# Patient Record
Sex: Female | Born: 1980 | Race: Black or African American | Hispanic: No | Smoking: Current every day smoker
Health system: Southern US, Academic
[De-identification: ages and names within clinical notes are randomized; demographics above are authoritative.]

## PROBLEM LIST (undated history)

## (undated) ENCOUNTER — Inpatient Hospital Stay (HOSPITAL_COMMUNITY): Payer: Self-pay

## (undated) DIAGNOSIS — E669 Obesity, unspecified: Secondary | ICD-10-CM

## (undated) DIAGNOSIS — N390 Urinary tract infection, site not specified: Secondary | ICD-10-CM

## (undated) DIAGNOSIS — O139 Gestational [pregnancy-induced] hypertension without significant proteinuria, unspecified trimester: Secondary | ICD-10-CM

## (undated) DIAGNOSIS — A599 Trichomoniasis, unspecified: Secondary | ICD-10-CM

## (undated) DIAGNOSIS — F329 Major depressive disorder, single episode, unspecified: Secondary | ICD-10-CM

## (undated) DIAGNOSIS — F17200 Nicotine dependence, unspecified, uncomplicated: Secondary | ICD-10-CM

## (undated) DIAGNOSIS — N159 Renal tubulo-interstitial disease, unspecified: Secondary | ICD-10-CM

## (undated) DIAGNOSIS — A539 Syphilis, unspecified: Secondary | ICD-10-CM

## (undated) DIAGNOSIS — Z8619 Personal history of other infectious and parasitic diseases: Secondary | ICD-10-CM

## (undated) DIAGNOSIS — I10 Essential (primary) hypertension: Secondary | ICD-10-CM

## (undated) DIAGNOSIS — O165 Unspecified maternal hypertension, complicating the puerperium: Secondary | ICD-10-CM

## (undated) DIAGNOSIS — IMO0002 Reserved for concepts with insufficient information to code with codable children: Secondary | ICD-10-CM

## (undated) DIAGNOSIS — Z872 Personal history of diseases of the skin and subcutaneous tissue: Secondary | ICD-10-CM

## (undated) DIAGNOSIS — B9689 Other specified bacterial agents as the cause of diseases classified elsewhere: Secondary | ICD-10-CM

## (undated) DIAGNOSIS — B379 Candidiasis, unspecified: Secondary | ICD-10-CM

## (undated) DIAGNOSIS — F32A Depression, unspecified: Secondary | ICD-10-CM

## (undated) DIAGNOSIS — N760 Acute vaginitis: Secondary | ICD-10-CM

## (undated) HISTORY — DX: Trichomoniasis, unspecified: A59.9

## (undated) HISTORY — DX: Personal history of diseases of the skin and subcutaneous tissue: Z87.2

## (undated) HISTORY — DX: Reserved for concepts with insufficient information to code with codable children: IMO0002

## (undated) HISTORY — DX: Depression, unspecified: F32.A

## (undated) HISTORY — DX: Personal history of other infectious and parasitic diseases: Z86.19

## (undated) HISTORY — DX: Obesity, unspecified: E66.9

## (undated) HISTORY — DX: Urinary tract infection, site not specified: N39.0

## (undated) HISTORY — DX: Major depressive disorder, single episode, unspecified: F32.9

## (undated) HISTORY — DX: Nicotine dependence, unspecified, uncomplicated: F17.200

## (undated) HISTORY — DX: Syphilis, unspecified: A53.9

## (undated) HISTORY — DX: Unspecified maternal hypertension, complicating the puerperium: O16.5

## (undated) HISTORY — DX: Gestational (pregnancy-induced) hypertension without significant proteinuria, unspecified trimester: O13.9

## (undated) HISTORY — DX: Renal tubulo-interstitial disease, unspecified: N15.9

## (undated) HISTORY — DX: Acute vaginitis: N76.0

## (undated) HISTORY — DX: Other specified bacterial agents as the cause of diseases classified elsewhere: B96.89

## (undated) HISTORY — DX: Candidiasis, unspecified: B37.9

---

## 2003-12-10 DIAGNOSIS — Z8619 Personal history of other infectious and parasitic diseases: Secondary | ICD-10-CM

## 2003-12-10 HISTORY — DX: Personal history of other infectious and parasitic diseases: Z86.19

## 2004-01-05 ENCOUNTER — Ambulatory Visit (HOSPITAL_COMMUNITY): Admission: RE | Admit: 2004-01-05 | Discharge: 2004-01-05 | Payer: Self-pay | Admitting: *Deleted

## 2004-01-10 DIAGNOSIS — N760 Acute vaginitis: Secondary | ICD-10-CM

## 2004-01-10 DIAGNOSIS — B9689 Other specified bacterial agents as the cause of diseases classified elsewhere: Secondary | ICD-10-CM

## 2004-01-10 DIAGNOSIS — A599 Trichomoniasis, unspecified: Secondary | ICD-10-CM

## 2004-01-10 HISTORY — DX: Other specified bacterial agents as the cause of diseases classified elsewhere: N76.0

## 2004-01-10 HISTORY — DX: Trichomoniasis, unspecified: A59.9

## 2004-01-10 HISTORY — DX: Other specified bacterial agents as the cause of diseases classified elsewhere: B96.89

## 2004-02-15 ENCOUNTER — Inpatient Hospital Stay (HOSPITAL_COMMUNITY): Admission: AD | Admit: 2004-02-15 | Discharge: 2004-02-15 | Payer: Self-pay | Admitting: *Deleted

## 2004-02-25 ENCOUNTER — Emergency Department (HOSPITAL_COMMUNITY): Admission: EM | Admit: 2004-02-25 | Discharge: 2004-02-25 | Payer: Self-pay | Admitting: Emergency Medicine

## 2004-03-14 ENCOUNTER — Inpatient Hospital Stay (HOSPITAL_COMMUNITY): Admission: AD | Admit: 2004-03-14 | Discharge: 2004-03-14 | Payer: Self-pay | Admitting: Obstetrics and Gynecology

## 2004-03-16 ENCOUNTER — Inpatient Hospital Stay (HOSPITAL_COMMUNITY): Admission: AD | Admit: 2004-03-16 | Discharge: 2004-03-16 | Payer: Self-pay | Admitting: Obstetrics & Gynecology

## 2004-03-23 ENCOUNTER — Ambulatory Visit (HOSPITAL_COMMUNITY): Admission: RE | Admit: 2004-03-23 | Discharge: 2004-03-23 | Payer: Self-pay | Admitting: Obstetrics and Gynecology

## 2004-05-06 ENCOUNTER — Inpatient Hospital Stay (HOSPITAL_COMMUNITY): Admission: AD | Admit: 2004-05-06 | Discharge: 2004-05-06 | Payer: Self-pay | Admitting: Obstetrics and Gynecology

## 2004-06-29 ENCOUNTER — Inpatient Hospital Stay (HOSPITAL_COMMUNITY): Admission: AD | Admit: 2004-06-29 | Discharge: 2004-06-30 | Payer: Self-pay | Admitting: Obstetrics and Gynecology

## 2004-08-13 ENCOUNTER — Inpatient Hospital Stay (HOSPITAL_COMMUNITY): Admission: AD | Admit: 2004-08-13 | Discharge: 2004-08-15 | Payer: Self-pay | Admitting: Obstetrics and Gynecology

## 2004-08-13 DIAGNOSIS — N390 Urinary tract infection, site not specified: Secondary | ICD-10-CM

## 2004-08-13 HISTORY — DX: Urinary tract infection, site not specified: N39.0

## 2005-02-07 ENCOUNTER — Emergency Department (HOSPITAL_COMMUNITY): Admission: EM | Admit: 2005-02-07 | Discharge: 2005-02-07 | Payer: Self-pay | Admitting: Emergency Medicine

## 2005-05-02 ENCOUNTER — Emergency Department (HOSPITAL_COMMUNITY): Admission: EM | Admit: 2005-05-02 | Discharge: 2005-05-02 | Payer: Self-pay | Admitting: Emergency Medicine

## 2006-02-27 ENCOUNTER — Other Ambulatory Visit: Admission: RE | Admit: 2006-02-27 | Discharge: 2006-02-27 | Payer: Self-pay | Admitting: Obstetrics and Gynecology

## 2006-06-26 ENCOUNTER — Inpatient Hospital Stay (HOSPITAL_COMMUNITY): Admission: AD | Admit: 2006-06-26 | Discharge: 2006-06-26 | Payer: Self-pay | Admitting: Obstetrics and Gynecology

## 2006-08-13 ENCOUNTER — Inpatient Hospital Stay (HOSPITAL_COMMUNITY): Admission: AD | Admit: 2006-08-13 | Discharge: 2006-08-16 | Payer: Self-pay | Admitting: Obstetrics and Gynecology

## 2006-08-25 ENCOUNTER — Inpatient Hospital Stay (HOSPITAL_COMMUNITY): Admission: AD | Admit: 2006-08-25 | Discharge: 2006-08-27 | Payer: Self-pay | Admitting: Obstetrics and Gynecology

## 2006-09-03 DIAGNOSIS — O165 Unspecified maternal hypertension, complicating the puerperium: Secondary | ICD-10-CM

## 2006-09-03 HISTORY — DX: Unspecified maternal hypertension, complicating the puerperium: O16.5

## 2007-04-23 ENCOUNTER — Encounter (INDEPENDENT_AMBULATORY_CARE_PROVIDER_SITE_OTHER): Payer: Self-pay | Admitting: *Deleted

## 2007-08-15 ENCOUNTER — Emergency Department (HOSPITAL_COMMUNITY): Admission: EM | Admit: 2007-08-15 | Discharge: 2007-08-16 | Payer: Self-pay | Admitting: Emergency Medicine

## 2007-12-09 ENCOUNTER — Emergency Department (HOSPITAL_COMMUNITY): Admission: EM | Admit: 2007-12-09 | Discharge: 2007-12-09 | Payer: Self-pay | Admitting: Emergency Medicine

## 2008-02-01 ENCOUNTER — Emergency Department (HOSPITAL_COMMUNITY): Admission: EM | Admit: 2008-02-01 | Discharge: 2008-02-01 | Payer: Self-pay | Admitting: Family Medicine

## 2008-06-16 ENCOUNTER — Emergency Department (HOSPITAL_COMMUNITY): Admission: EM | Admit: 2008-06-16 | Discharge: 2008-06-16 | Payer: Self-pay | Admitting: Emergency Medicine

## 2008-07-28 ENCOUNTER — Emergency Department (HOSPITAL_COMMUNITY): Admission: EM | Admit: 2008-07-28 | Discharge: 2008-07-29 | Payer: Self-pay | Admitting: Emergency Medicine

## 2008-12-05 ENCOUNTER — Emergency Department (HOSPITAL_COMMUNITY): Admission: EM | Admit: 2008-12-05 | Discharge: 2008-12-06 | Payer: Self-pay | Admitting: Emergency Medicine

## 2009-01-06 ENCOUNTER — Encounter (INDEPENDENT_AMBULATORY_CARE_PROVIDER_SITE_OTHER): Payer: Self-pay | Admitting: Family Medicine

## 2009-01-06 ENCOUNTER — Ambulatory Visit: Payer: Self-pay | Admitting: Family Medicine

## 2009-01-06 DIAGNOSIS — E669 Obesity, unspecified: Secondary | ICD-10-CM | POA: Insufficient documentation

## 2009-01-06 DIAGNOSIS — I10 Essential (primary) hypertension: Secondary | ICD-10-CM | POA: Insufficient documentation

## 2009-01-06 DIAGNOSIS — E66812 Obesity, class 2: Secondary | ICD-10-CM | POA: Insufficient documentation

## 2009-01-06 DIAGNOSIS — N943 Premenstrual tension syndrome: Secondary | ICD-10-CM | POA: Insufficient documentation

## 2009-01-06 DIAGNOSIS — F17200 Nicotine dependence, unspecified, uncomplicated: Secondary | ICD-10-CM | POA: Insufficient documentation

## 2009-01-06 LAB — CONVERTED CEMR LAB
AST: 26 units/L (ref 0–37)
BUN: 6 mg/dL (ref 6–23)
CO2: 24 meq/L (ref 19–32)
Calcium: 9.3 mg/dL (ref 8.4–10.5)
Chloride: 108 meq/L (ref 96–112)
Cholesterol: 127 mg/dL (ref 0–200)
Creatinine, Ser: 0.81 mg/dL (ref 0.40–1.20)
HDL: 44 mg/dL (ref >39)
Total Bilirubin: 0.3 mg/dL (ref 0.3–1.2)
Total CHOL/HDL Ratio: 2.9
VLDL: 11 mg/dL (ref 0–40)

## 2009-01-09 ENCOUNTER — Encounter (INDEPENDENT_AMBULATORY_CARE_PROVIDER_SITE_OTHER): Payer: Self-pay | Admitting: Family Medicine

## 2009-02-14 ENCOUNTER — Telehealth: Payer: Self-pay | Admitting: *Deleted

## 2009-03-20 ENCOUNTER — Telehealth (INDEPENDENT_AMBULATORY_CARE_PROVIDER_SITE_OTHER): Payer: Self-pay | Admitting: Family Medicine

## 2009-03-22 ENCOUNTER — Encounter (INDEPENDENT_AMBULATORY_CARE_PROVIDER_SITE_OTHER): Payer: Self-pay | Admitting: Family Medicine

## 2009-03-22 ENCOUNTER — Ambulatory Visit: Payer: Self-pay | Admitting: Family Medicine

## 2009-03-22 DIAGNOSIS — IMO0002 Reserved for concepts with insufficient information to code with codable children: Secondary | ICD-10-CM | POA: Insufficient documentation

## 2009-04-21 ENCOUNTER — Emergency Department (HOSPITAL_COMMUNITY): Admission: EM | Admit: 2009-04-21 | Discharge: 2009-04-21 | Payer: Self-pay | Admitting: Emergency Medicine

## 2009-08-22 ENCOUNTER — Telehealth: Payer: Self-pay | Admitting: Family Medicine

## 2009-08-29 ENCOUNTER — Emergency Department (HOSPITAL_COMMUNITY): Admission: EM | Admit: 2009-08-29 | Discharge: 2009-08-30 | Payer: Self-pay | Admitting: Emergency Medicine

## 2010-02-05 ENCOUNTER — Emergency Department (HOSPITAL_BASED_OUTPATIENT_CLINIC_OR_DEPARTMENT_OTHER): Admission: EM | Admit: 2010-02-05 | Discharge: 2010-02-05 | Payer: Self-pay | Admitting: Emergency Medicine

## 2010-02-05 ENCOUNTER — Ambulatory Visit: Payer: Self-pay | Admitting: Interventional Radiology

## 2010-06-04 ENCOUNTER — Emergency Department (HOSPITAL_COMMUNITY): Admission: EM | Admit: 2010-06-04 | Discharge: 2010-06-05 | Payer: Self-pay | Admitting: Emergency Medicine

## 2010-06-13 ENCOUNTER — Emergency Department (HOSPITAL_BASED_OUTPATIENT_CLINIC_OR_DEPARTMENT_OTHER): Admission: EM | Admit: 2010-06-13 | Discharge: 2010-06-13 | Payer: Self-pay | Admitting: Emergency Medicine

## 2011-01-18 ENCOUNTER — Encounter: Payer: Self-pay | Admitting: *Deleted

## 2011-02-06 ENCOUNTER — Emergency Department (HOSPITAL_BASED_OUTPATIENT_CLINIC_OR_DEPARTMENT_OTHER)
Admission: EM | Admit: 2011-02-06 | Discharge: 2011-02-06 | Disposition: A | Discharge status: Home / Self Care | Payer: Medicaid Other | Attending: Emergency Medicine | Admitting: Emergency Medicine

## 2011-02-06 DIAGNOSIS — I10 Essential (primary) hypertension: Secondary | ICD-10-CM | POA: Insufficient documentation

## 2011-02-06 DIAGNOSIS — B9689 Other specified bacterial agents as the cause of diseases classified elsewhere: Secondary | ICD-10-CM | POA: Insufficient documentation

## 2011-02-06 DIAGNOSIS — E669 Obesity, unspecified: Secondary | ICD-10-CM | POA: Insufficient documentation

## 2011-02-06 DIAGNOSIS — A499 Bacterial infection, unspecified: Secondary | ICD-10-CM | POA: Insufficient documentation

## 2011-02-06 DIAGNOSIS — N760 Acute vaginitis: Secondary | ICD-10-CM | POA: Insufficient documentation

## 2011-02-06 LAB — PREGNANCY, URINE: Preg Test, Ur: NEGATIVE

## 2011-02-06 LAB — WET PREP, GENITAL
Trich, Wet Prep: NONE SEEN
Yeast Wet Prep HPF POC: NONE SEEN

## 2011-02-07 LAB — RPR: RPR Ser Ql: NONREACTIVE

## 2011-02-07 LAB — GC/CHLAMYDIA PROBE AMP, GENITAL
Chlamydia, DNA Probe: NEGATIVE
GC Probe Amp, Genital: POSITIVE — AB

## 2011-02-16 ENCOUNTER — Emergency Department (HOSPITAL_BASED_OUTPATIENT_CLINIC_OR_DEPARTMENT_OTHER): Payer: Self-pay

## 2011-02-16 ENCOUNTER — Emergency Department (HOSPITAL_BASED_OUTPATIENT_CLINIC_OR_DEPARTMENT_OTHER)
Admission: EM | Admit: 2011-02-16 | Discharge: 2011-02-16 | Disposition: A | Discharge status: Home / Self Care | Payer: Self-pay | Attending: Emergency Medicine | Admitting: Emergency Medicine

## 2011-02-16 ENCOUNTER — Emergency Department (INDEPENDENT_AMBULATORY_CARE_PROVIDER_SITE_OTHER): Payer: Self-pay

## 2011-02-16 DIAGNOSIS — IMO0002 Reserved for concepts with insufficient information to code with codable children: Secondary | ICD-10-CM

## 2011-02-16 DIAGNOSIS — S60229A Contusion of unspecified hand, initial encounter: Secondary | ICD-10-CM | POA: Insufficient documentation

## 2011-02-16 DIAGNOSIS — F411 Generalized anxiety disorder: Secondary | ICD-10-CM | POA: Insufficient documentation

## 2011-02-16 DIAGNOSIS — Y9229 Other specified public building as the place of occurrence of the external cause: Secondary | ICD-10-CM | POA: Insufficient documentation

## 2011-02-16 DIAGNOSIS — I10 Essential (primary) hypertension: Secondary | ICD-10-CM | POA: Insufficient documentation

## 2011-02-16 DIAGNOSIS — F17200 Nicotine dependence, unspecified, uncomplicated: Secondary | ICD-10-CM | POA: Insufficient documentation

## 2011-02-24 LAB — CBC
HCT: 40.4 % (ref 36.0–46.0)
MCV: 90.8 fL (ref 78.0–100.0)
Platelets: 285 10*3/uL (ref 150–400)
RBC: 4.45 MIL/uL (ref 3.87–5.11)
WBC: 10.4 10*3/uL (ref 4.0–10.5)

## 2011-02-24 LAB — URINALYSIS, ROUTINE W REFLEX MICROSCOPIC
Bilirubin Urine: NEGATIVE
Leukocytes, UA: NEGATIVE
Nitrite: NEGATIVE
Specific Gravity, Urine: 1.023 (ref 1.005–1.030)
pH: 6 (ref 5.0–8.0)

## 2011-02-24 LAB — DIFFERENTIAL
Eosinophils Relative: 4 % (ref 0–5)
Lymphocytes Relative: 27 % (ref 12–46)
Lymphs Abs: 2.8 10*3/uL (ref 0.7–4.0)

## 2011-02-24 LAB — POCT I-STAT, CHEM 8
Calcium, Ion: 1.2 mmol/L (ref 1.12–1.32)
Creatinine, Ser: 1.2 mg/dL (ref 0.4–1.2)
Glucose, Bld: 93 mg/dL (ref 70–99)
Hemoglobin: 14.6 g/dL (ref 12.0–15.0)
Sodium: 140 mEq/L (ref 135–145)
TCO2: 25 mmol/L (ref 0–100)

## 2011-02-24 LAB — POCT PREGNANCY, URINE: Preg Test, Ur: NEGATIVE

## 2011-02-24 LAB — URINE MICROSCOPIC-ADD ON

## 2011-02-24 LAB — WET PREP, GENITAL

## 2011-02-28 LAB — BASIC METABOLIC PANEL
GFR calc Af Amer: >60 mL/min (ref >60)
GFR calc non Af Amer: >60 mL/min (ref >60)
Potassium: 3.4 mEq/L — ABNORMAL LOW (ref 3.5–5.1)
Sodium: 146 mEq/L — ABNORMAL HIGH (ref 135–145)

## 2011-02-28 LAB — DIFFERENTIAL
Eosinophils Relative: 4 % (ref 0–5)
Lymphocytes Relative: 34 % (ref 12–46)
Lymphs Abs: 3.1 10*3/uL (ref 0.7–4.0)
Monocytes Absolute: 0.7 10*3/uL (ref 0.1–1.0)
Neutro Abs: 4.9 10*3/uL (ref 1.7–7.7)

## 2011-02-28 LAB — POCT CARDIAC MARKERS
CKMB, poc: <1 ng/mL — ABNORMAL LOW (ref 1.0–8.0)
Troponin i, poc: <0.05 ng/mL (ref 0.00–0.09)

## 2011-02-28 LAB — CBC
HCT: 37.9 % (ref 36.0–46.0)
Hemoglobin: 12.6 g/dL (ref 12.0–15.0)
WBC: 9.3 10*3/uL (ref 4.0–10.5)

## 2011-04-22 ENCOUNTER — Emergency Department (HOSPITAL_BASED_OUTPATIENT_CLINIC_OR_DEPARTMENT_OTHER)
Admission: EM | Admit: 2011-04-22 | Discharge: 2011-04-23 | Disposition: A | Discharge status: Home / Self Care | Payer: Self-pay | Attending: Emergency Medicine | Admitting: Emergency Medicine

## 2011-04-22 DIAGNOSIS — F17200 Nicotine dependence, unspecified, uncomplicated: Secondary | ICD-10-CM | POA: Insufficient documentation

## 2011-04-22 DIAGNOSIS — R05 Cough: Secondary | ICD-10-CM | POA: Insufficient documentation

## 2011-04-22 DIAGNOSIS — R112 Nausea with vomiting, unspecified: Secondary | ICD-10-CM | POA: Insufficient documentation

## 2011-04-22 DIAGNOSIS — R071 Chest pain on breathing: Secondary | ICD-10-CM | POA: Insufficient documentation

## 2011-04-22 DIAGNOSIS — E669 Obesity, unspecified: Secondary | ICD-10-CM | POA: Insufficient documentation

## 2011-04-22 DIAGNOSIS — B9789 Other viral agents as the cause of diseases classified elsewhere: Secondary | ICD-10-CM | POA: Insufficient documentation

## 2011-04-22 DIAGNOSIS — R059 Cough, unspecified: Secondary | ICD-10-CM | POA: Insufficient documentation

## 2011-04-22 DIAGNOSIS — R197 Diarrhea, unspecified: Secondary | ICD-10-CM | POA: Insufficient documentation

## 2011-04-22 LAB — URINALYSIS, ROUTINE W REFLEX MICROSCOPIC
Bilirubin Urine: NEGATIVE
Nitrite: NEGATIVE
Protein, ur: NEGATIVE mg/dL
Specific Gravity, Urine: 1.022 (ref 1.005–1.030)
Urobilinogen, UA: 1 mg/dL (ref 0.0–1.0)

## 2011-04-22 LAB — URINE MICROSCOPIC-ADD ON

## 2011-04-22 LAB — PREGNANCY, URINE: Preg Test, Ur: NEGATIVE

## 2011-04-23 ENCOUNTER — Emergency Department (INDEPENDENT_AMBULATORY_CARE_PROVIDER_SITE_OTHER): Payer: Self-pay

## 2011-04-23 DIAGNOSIS — R509 Fever, unspecified: Secondary | ICD-10-CM

## 2011-04-23 DIAGNOSIS — R059 Cough, unspecified: Secondary | ICD-10-CM

## 2011-04-23 DIAGNOSIS — R05 Cough: Secondary | ICD-10-CM
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
## 2011-04-26 NOTE — Discharge Summary (Signed)
NAME:  Crosby, Lisa                   ACCOUNT NO.:  [id number]   MEDICAL RECORD NO.:  [id number]          PATIENT TYPE:  INP   LOCATION:  9372                          [facility]  [facility]   PHYSICIAN:  Dois Davenport A. Rivard, M.D. DATE OF BIRTH:  May 28, 1981   DATE OF ADMISSION:  08/25/2006  DATE OF DISCHARGE:  08/27/2006                                 DISCHARGE SUMMARY   ADMITTING DIAGNOSES:  1. Eight days status post spontaneous vaginal birth.  2. Preeclampsia postpartum.  3. Postpartum depression.   DISCHARGE DIAGNOSES:  1. Eight days status post spontaneous vaginal birth.  2. Preeclampsia postpartum.  3. Postpartum depression.   PROCEDURES:  Magnesium sulfate therapy.   HOSPITAL COURSE:  [name] is a 30 year old gravida 2, para 2-0-0-2 at  eight days' status post spontaneous vaginal birth who presented from the  office with elevated blood pressure and proteinuria on a voided specimen.  She also noted headaches, visual symptoms, anorexia, and postpartum  depression.  She had been started on Lexapro that day by Rhona Leavens, CNM.  She had no food intake for 2 days and no fluids for 24 hours.  She did  report that she just had no appetite and was having difficulty dealing with  family life and a new baby.   Her history had been remarkable for:   Problem #1:  IUGR for which she was induced on 09/05 to 09/06 with a  spontaneous vaginal birth.  She had some occasional mild elevations of  pressure during that hospitalization, but PIH workup was negative with a  negative urine protein.   Problem #2:  History of depression in the past with postpartum depression  following the birth of her last baby.  The patient has not been on any  medication during her pregnancy.   Problem #3:  History of STDs in the past.  On admission the patient was  noted to have blood pressures in the 140-150/92-105 range.  Her weight was  204 on maternity admission scale.  Her catheterized UA showed 100 mg  protein.  Her CBC and comprehensive metabolic panel were within normal  limits.  She was admitted to the AICU for magnesium sulfate therapy and was  placed on Lexapro 10 mg daily.  The chaplain did see the patient as an  initial visit.  The patient also continued to have a severe headache.  She  was placed on Percocet and Flexeril for her headache.  A social work and  psyche consult were obtained.  Dr. Jeanie Sewer, the psychiatrist, recommended  Trazodone.  Over the next 24 hours her blood pressure began to improve.  Magnesium sulfate was discontinued on 09/18.  Lexapro and Trazodone were  continued.  By hospital day #3 the patient was feeling much better.  Her  headache was very very mild.  She was feeling somewhat anxious to go home,  but also had some reluctance due to just anxiety and understanding that she  had other responsibilities at home. Her mother had come from [place] and  was going to plan to stay with  her for 2 weeks, and the patient felt good  about this plan.  The patient's blood pressure was in the 130-140/78-87  range.  Her physical exam was within normal limits.  She had lost 3 pounds  from the day before. She was diuresing very well with a 2700 mL negative  balance.  Dr. Estanislado Pandy saw the patient that day.  Tera Helper as [facility] also saw the patient.  WIC also was consulted and processes  were made to get the patient certified for [facility].  The patient's psychosocial  situation was improving.  The patient had reported feeling overwhelmed.  The  infant had not been feeding very well due to a breast feeding issue and the  patient was taking on lots of responsibility for that.  The patient denied  any suicidal or homicidal ideations and feels that she was improving  significantly.  The patient declined outpatient counseling.  She does have a  postpartum plan for followup with her [facility].  She is now bottle feeding and  feels more comfortable with that.   Dr.  Estanislado Pandy saw the patient on 09/19 in the afternoon.  The patient was  continuing to feel much better and did want to go home. The patient did ask  Dr. Estanislado Pandy to examine her son's penis.  There was a small pustule at the  base.  There was no redness or edema noted.  The penis was cleaned,  antibiotic ointment was applied and the patient will followup with her  pediatrician.   The plan was made for the patient to go home with additional care for the  patient from her mother.  The patient was deemed to receive full benefit of  her hospital stay and was discharged home.   DISCHARGE INSTRUCTIONS:  The patient is to have the routine postpartum  instructions. She is to monitor for any worsening signs of elevated blood  pressure including headache, visual symptoms, and epigastric pain.  Signs  and symptoms of worsening postpartum depression were also reviewed with the  patient.   POSTPARTUM MEDICATIONS:  1. Aldomet 250 mg p.o. b.i.d.  2. Lexapro 10 mg 1 mg p.o. daily.  3. Trazodone 25 mg 1 p.o. q.h.s.  4. The patient will also continue Motrin 600 mg p.o. q.6 h. p.r.n. pain      and Percocet 1-2 p.o. q.3-4 h. p.r.n. pain that were previously      prescribed.   DISCHARGE FOLLOWUP:  Will occur in 1 week at [facility].  The  patient clearly understands the need to call with any worsening of any  symptoms.      Renaldo Reel Emilee Hero, C.N.M.      Crist Fat Rivard, M.D.  Electronically Signed    VLL/MEDQ  D:  08/28/2006  T:  08/29/2006  Job:  [id number]
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
## 2011-06-22 ENCOUNTER — Encounter: Payer: Self-pay | Admitting: *Deleted

## 2011-06-22 ENCOUNTER — Emergency Department (HOSPITAL_BASED_OUTPATIENT_CLINIC_OR_DEPARTMENT_OTHER)
Admission: EM | Admit: 2011-06-22 | Discharge: 2011-06-22 | Disposition: A | Discharge status: Home / Self Care | Payer: Self-pay | Attending: Emergency Medicine | Admitting: Emergency Medicine

## 2011-06-22 DIAGNOSIS — Z202 Contact with and (suspected) exposure to infections with a predominantly sexual mode of transmission: Secondary | ICD-10-CM | POA: Insufficient documentation

## 2011-06-22 DIAGNOSIS — I10 Essential (primary) hypertension: Secondary | ICD-10-CM | POA: Insufficient documentation

## 2011-06-22 HISTORY — DX: Essential (primary) hypertension: I10

## 2011-06-22 MED ORDER — CLONIDINE HCL 0.1 MG PO TABS: 0.1000 mg | ORAL_TABLET | Freq: Once | ORAL | Status: DC

## 2011-06-22 MED ORDER — CLONIDINE HCL 0.1 MG PO TABS
0.1000 mg | ORAL_TABLET | Freq: Once | ORAL | Status: AC
  Administered 2011-06-22: 0.1 mg via ORAL
  Filled 2011-06-22: qty 1

## 2011-06-22 MED ORDER — LIDOCAINE HCL (PF) 1 % IJ SOLN
INTRAMUSCULAR | Status: AC
  Administered 2011-06-22: 18:00:00 via INTRAMUSCULAR
  Filled 2011-06-22: qty 5

## 2011-06-22 MED ORDER — CEFTRIAXONE SODIUM 250 MG IJ SOLR
250.0000 mg | INTRAMUSCULAR | Status: DC
  Administered 2011-06-22: 250 mg via INTRAMUSCULAR
  Filled 2011-06-22: qty 250

## 2011-06-22 NOTE — ED Provider Notes (Signed)
History     Chief Complaint  Patient presents with  . Hypertension   Patient is a 30 y.o. female presenting with hypertension. The history is provided by the patient.  Hypertension This is a chronic problem. The problem has been unchanged. Pertinent negatives include no chest pain, chills or fever. Associated symptoms comments: She reports that she has no chest pain, shortness of breath or extremity swelling.. The symptoms are aggravated by nothing. She has tried nothing for the symptoms.    Past Medical History  Diagnosis Date  . Hypertension     History reviewed. No pertinent past surgical history.  No family history on file.  History  Substance Use Topics  . Smoking status: Current Some Day Smoker  . Smokeless tobacco: Not on file  . Alcohol Use: Yes    OB History    Grav Para Term Preterm Abortions TAB SAB Ect Mult Living                  Review of Systems  Constitutional: Negative for fever and chills.  HENT: Negative.   Respiratory: Negative.   Cardiovascular: Negative.  Negative for chest pain.  Gastrointestinal: Negative.   Musculoskeletal: Negative.   Skin: Negative.   Neurological: Negative.     Physical Exam  BP 144/97  Pulse 76  Temp(Src) 99.5 F (37.5 C) (Oral)  Resp 16  SpO2 99%  LMP 05/31/2011  Physical Exam  Constitutional: She appears well-developed and well-nourished.  HENT:  Head: Normocephalic.  Neck: Normal range of motion. Neck supple.  Cardiovascular: Normal rate and regular rhythm.   Pulmonary/Chest: Effort normal and breath sounds normal.  Abdominal: Soft. Bowel sounds are normal. There is no tenderness. There is no rebound and no guarding.  Genitourinary:       Deferred:  The patient denies vaginal pain, discharge, abnormal bleeding or lesions.  Musculoskeletal: Normal range of motion. She exhibits no edema.  Neurological: She is alert. No cranial nerve deficit.  Skin: Skin is warm and dry. No rash noted.  Psychiatric: She  has a normal mood and affect.    ED Course  Procedures The patient reports she was seen at the health department recently for vaginal discharge. She was diagnosed with vaginitis and has completed the antibiotic she was given. She reports no further symptoms. She states that she was contacted with a positive result on her cultures for gonorrhea and requests treatment. Will treat based on history. She does not require another pelvic exam. MDM       Rodena Medin, PA 06/22/11 1738  Rodena Medin, PA 06/22/11 1747

## 2011-06-22 NOTE — ED Notes (Signed)
3 days ago ran out of klonidine for HTN, has no insurance and cannot afford to see PCP

## 2011-06-22 NOTE — ED Notes (Signed)
Pt c/o HTN, ran out of meds 3 days ago and cannot afford to go to PCP

## 2011-08-05 NOTE — ED Provider Notes (Signed)
History     [name]: [id number] Arrival date & time: 06/22/2011  5:07 PM  Chief Complaint  Patient presents with  . Hypertension   HPI  Past Medical History  Diagnosis Date  . Hypertension     History reviewed. No pertinent past surgical history.  No family history on file.  History  Substance Use Topics  . Smoking status: Current Some Day Smoker  . Smokeless tobacco: Not on file  . Alcohol Use: Yes    OB History    Grav Para Term Preterm Abortions TAB SAB Ect Mult Living                  Review of Systems  Physical Exam  BP 144/97  Pulse 76  Temp(Src) 99.5 F (37.5 C) (Oral)  Resp 16  SpO2 99%  LMP 05/31/2011  Physical Exam  ED Course  Procedures  MDM Duplicate note      Doug Sou, MD 08/21/11 1050

## 2011-08-18 NOTE — ED Provider Notes (Signed)
Medical screening examination/treatment/procedure(s) were performed by non-physician practitioner and as supervising physician I was immediately available for consultation/collaboration.  Doug Sou, MD 08/18/11 1550

## 2011-09-05 LAB — POCT I-STAT, CHEM 8
Creatinine, Ser: 1
HCT: 43
Hemoglobin: 14.6
Potassium: 3.7
Sodium: 138
TCO2: 25

## 2011-09-05 LAB — POCT CARDIAC MARKERS
CKMB, poc: <1 — ABNORMAL LOW
Myoglobin, poc: 60.9
Operator id: 146091
Troponin i, poc: <0.05

## 2011-09-28 ENCOUNTER — Encounter (HOSPITAL_BASED_OUTPATIENT_CLINIC_OR_DEPARTMENT_OTHER): Payer: Self-pay | Admitting: *Deleted

## 2011-09-28 ENCOUNTER — Emergency Department (HOSPITAL_BASED_OUTPATIENT_CLINIC_OR_DEPARTMENT_OTHER)
Admission: EM | Admit: 2011-09-28 | Discharge: 2011-09-28 | Disposition: A | Discharge status: Home / Self Care | Payer: Self-pay | Attending: Emergency Medicine | Admitting: Emergency Medicine

## 2011-09-28 DIAGNOSIS — K089 Disorder of teeth and supporting structures, unspecified: Secondary | ICD-10-CM | POA: Insufficient documentation

## 2011-09-28 DIAGNOSIS — K0889 Other specified disorders of teeth and supporting structures: Secondary | ICD-10-CM

## 2011-09-28 DIAGNOSIS — I10 Essential (primary) hypertension: Secondary | ICD-10-CM | POA: Insufficient documentation

## 2011-09-28 MED ORDER — PENICILLIN V POTASSIUM 500 MG PO TABS: 500.0000 mg | ORAL_TABLET | Freq: Three times a day (TID) | ORAL | Status: AC

## 2011-09-28 MED ORDER — IBUPROFEN 600 MG PO TABS: 600.0000 mg | ORAL_TABLET | Freq: Four times a day (QID) | ORAL | Status: AC | PRN

## 2011-09-28 MED ORDER — OXYCODONE-ACETAMINOPHEN 5-325 MG PO TABS: 1.0000 | ORAL_TABLET | Freq: Four times a day (QID) | ORAL | Status: AC | PRN

## 2011-09-28 NOTE — ED Notes (Signed)
Dr. Karma Ganja advised of BP. She has discussed this with the pt who is on meds for same.

## 2011-09-28 NOTE — ED Notes (Signed)
MD at bedside. 

## 2011-09-28 NOTE — ED Provider Notes (Signed)
Scribed for Ethelda Chick, MD, the patient was seen in room MH12/MH12 . This chart was scribed by Ellie Lunch. This patient's care was started at 4:53 PM.   [name]: [id number] Arrival date & time: 09/28/2011  4:33 PM   First MD Initiated Contact with Patient 09/28/11 1645      Chief Complaint  Patient presents with  . Dental Pain    (Consider location/radiation/quality/duration/timing/severity/associated sxs/prior treatment) HPI Yarithza F Wojnar is a 30 y.o. female who presents to the Emergency Department complaining of right lower dental pain with some associated swelling. Pain has been present for a while, but became progressively worse two days ago. Pt describes pain as throbbing, severe, and constant. Pain is aggravated by cold air.  Pt treated pain with IB profen this morning with little improvement. Pt denies fever, difficulty swallowing, SOB. Pt reports she has no dentist. Pt reports no recent antibiotic.  No difficulty breathing  Past Medical History  Diagnosis Date  . Hypertension     History reviewed. No pertinent past surgical history.  History reviewed. No pertinent family history.  History  Substance Use Topics  . Smoking status: Current Some Day Smoker  . Smokeless tobacco: Not on file  . Alcohol Use: Yes    Review of Systems 10 Systems reviewed and are negative for acute change except as noted in the HPI.  Allergies  Review of patient's allergies indicates no known allergies.  Home Medications   Current Outpatient Rx  Name Route Sig Dispense Refill  . BUPROPION HCL ER (XL) 300 MG PO TB24 Oral Take 300 mg by mouth every morning.      Marland Kitchen CLONIDINE HCL 0.1 MG PO TABS Oral Take 0.1 mg by mouth 2 (two) times daily.     Marland Kitchen CLONIDINE HCL 0.1 MG PO TABS Oral Take 1 tablet (0.1 mg total) by mouth once. 60 tablet 0    BP 172/99  Pulse 86  Temp(Src) 98.3 F (36.8 C) (Oral)  Resp 20  Ht 5\' 4"  (1.626 m)  Wt 185 lb (83.915 kg)  BMI 31.76 kg/m2  SpO2 96%  LMP  09/05/2011 Vitals reviewed, hypertensive Physical Exam  Constitutional: She is oriented to person, place, and time. She appears well-developed and well-nourished.  HENT:  Head: Normocephalic.       Poor dentition. Right lower premolar 60% eroded. No sign of periapical abscess. No significant adenopathy. No swelling under tongue.   Eyes: Conjunctivae and EOM are normal.  Neck: Neck supple.  Cardiovascular: Normal rate, regular rhythm and normal heart sounds.   Pulmonary/Chest: Effort normal and breath sounds normal.  Abdominal: Soft. There is no tenderness.  Musculoskeletal: Normal range of motion.  Lymphadenopathy:    She has no cervical adenopathy.  Neurological: She is alert and oriented to person, place, and time.  Skin: Skin is warm and dry.  Psychiatric: She has a normal mood and affect.   Procedures (including critical care time)  OTHER DATA REVIEWED: Nursing notes, vital signs, and past medical records reviewed.   DIAGNOSTIC STUDIES: Oxygen Saturation is 96% on room air, normal by my interpretation.    ED COURSE / COORDINATION OF CARE: 5:12 PM Plan to treat pain and infection with abx. Pt can take meds, abx, and ib profen. With follow up with dentist.  MDM: pt with significant decay/erosion of right lower premolar.  No periapical abscess.  No swelling under tongue.  Pt nontoxic and well hydrated appearing.  Rx given for percocet, ibuprofen, pcn.  Stressed the importance  of seeing a dentist in follow up.  Given hypertension precautions and advised to have BP rechecked by PMD   MEDICATIONS GIVEN IN THE E.D.  Medications  oxyCODONE-acetaminophen (PERCOCET) 5-325 MG per tablet   ibuprofen (ADVIL,MOTRIN) 600 MG tablet   penicillin v potassium (VEETID) 500 MG tablet    DISCHARGE MEDICATIONS: New Prescriptions   IBUPROFEN (ADVIL,MOTRIN) 600 MG TABLET    Take 1 tablet (600 mg total) by mouth every 6 (six) hours as needed for pain.   OXYCODONE-ACETAMINOPHEN (PERCOCET) 5-325  MG PER TABLET    Take 1-2 tablets by mouth every 6 (six) hours as needed for pain.   PENICILLIN V POTASSIUM (VEETID) 500 MG TABLET    Take 1 tablet (500 mg total) by mouth 3 (three) times daily.    SCRIBE ATTESTATION: I personally performed the services described in this documentation, which was scribed in my presence. The recorded information has been reviewed and considered.        Ethelda Chick, MD 09/28/11 [phone]

## 2011-09-28 NOTE — ED Notes (Signed)
Went to room to check and see if pt's ride was here and she was gone. Pt took discharge papers with her. Dr. Karma Ganja advised.

## 2011-09-28 NOTE — ED Notes (Signed)
Pt states she wants something for pain before she leaves. Dr. Karma Ganja advised. Pt will call for ride and they will advise [place] when they arrive.

## 2011-09-28 NOTE — ED Notes (Signed)
Pt states she is having pain in the right lower side of her mouth. Tooth is decayed.

## 2011-11-12 ENCOUNTER — Emergency Department (HOSPITAL_BASED_OUTPATIENT_CLINIC_OR_DEPARTMENT_OTHER)
Admission: EM | Admit: 2011-11-12 | Discharge: 2011-11-12 | Disposition: A | Discharge status: Home / Self Care | Payer: Self-pay | Attending: Emergency Medicine | Admitting: Emergency Medicine

## 2011-11-12 ENCOUNTER — Encounter (HOSPITAL_BASED_OUTPATIENT_CLINIC_OR_DEPARTMENT_OTHER): Payer: Self-pay | Admitting: Emergency Medicine

## 2011-11-12 DIAGNOSIS — I10 Essential (primary) hypertension: Secondary | ICD-10-CM | POA: Insufficient documentation

## 2011-11-12 DIAGNOSIS — K029 Dental caries, unspecified: Secondary | ICD-10-CM | POA: Insufficient documentation

## 2011-11-12 MED ORDER — HYDROCODONE-ACETAMINOPHEN 5-325 MG PO TABS
ORAL_TABLET | ORAL | Status: AC
  Filled 2011-11-12: qty 2

## 2011-11-12 MED ORDER — PENICILLIN V POTASSIUM 250 MG PO TABS
ORAL_TABLET | ORAL | Status: AC
  Administered 2011-11-12: 250 mg via ORAL
  Filled 2011-11-12: qty 2

## 2011-11-12 MED ORDER — HYDROCODONE-ACETAMINOPHEN 5-325 MG PO TABS
2.0000 | ORAL_TABLET | Freq: Once | ORAL | Status: AC
  Administered 2011-11-12: 2 via ORAL

## 2011-11-12 MED ORDER — PENICILLIN V POTASSIUM 250 MG PO TABS
500.0000 mg | ORAL_TABLET | Freq: Once | ORAL | Status: AC
  Administered 2011-11-12: 250 mg via ORAL

## 2011-11-12 MED ORDER — PENICILLIN V POTASSIUM 500 MG PO TABS: 500.0000 mg | ORAL_TABLET | Freq: Four times a day (QID) | ORAL | Status: AC

## 2011-11-12 MED ORDER — CLONIDINE HCL 0.1 MG PO TABS: 0.1000 mg | ORAL_TABLET | Freq: Two times a day (BID) | ORAL | Status: DC

## 2011-11-12 MED ORDER — HYDROCODONE-ACETAMINOPHEN 5-325 MG PO TABS: 2.0000 | ORAL_TABLET | ORAL | Status: AC | PRN

## 2011-11-12 NOTE — ED Provider Notes (Signed)
History     [name]: [id number] Arrival date & time: 11/12/2011  9:30 PM   First MD Initiated Contact with Patient 11/12/11 2143      Chief Complaint  Patient presents with  . Dental Pain    (Consider location/radiation/quality/duration/timing/severity/associated sxs/prior treatment) Patient is a 30 y.o. female presenting with tooth pain. The history is provided by the patient. No language interpreter was used.  Dental PainThe primary symptoms include mouth pain. The symptoms began 3 to 5 days ago. The symptoms are worsening. The symptoms are recurrent. The symptoms occur constantly.  Additional symptoms do not include: gum swelling and gum tenderness. Medical issues do not include: alcohol problem and smoking.  Pt complains of toothache.  Pt reports she is hoping to start a new job with dental insurance but she can not tolerate pain any more.  Pt also out of blood pressure medication.  Past Medical History  Diagnosis Date  . Hypertension     History reviewed. No pertinent past surgical history.  No family history on file.  History  Substance Use Topics  . Smoking status: Current Some Day Smoker  . Smokeless tobacco: Not on file  . Alcohol Use: Yes    OB History    Grav Para Term Preterm Abortions TAB SAB Ect Mult Living                  Review of Systems  HENT: Positive for dental problem.   All other systems reviewed and are negative.    Allergies  Shellfish allergy  Home Medications   Current Outpatient Rx  Name Route Sig Dispense Refill  . ACETAMINOPHEN 500 MG PO TABS Oral Take 1,000 mg by mouth once.      Marland Kitchen CLONIDINE HCL 0.1 MG PO TABS Oral Take 0.1 mg by mouth 2 (two) times daily.     . BUPROPION HCL ER (XL) 300 MG PO TB24 Oral Take 300 mg by mouth every morning.      Marland Kitchen CLONIDINE HCL 0.1 MG PO TABS Oral Take 0.1 mg by mouth once.      . IBUPROFEN 200 MG PO TABS Oral Take 800 mg by mouth every 6 (six) hours as needed. Tooth pain     . MULTI-VITAMIN/MINERALS  PO TABS Oral Take 1 tablet by mouth daily.        BP 141/103  Pulse 81  Temp(Src) 99.3 F (37.4 C) (Oral)  Resp 18  SpO2 99%  Physical Exam  Nursing note and vitals reviewed. Constitutional: She is oriented to person, place, and time. She appears well-developed and well-nourished.  HENT:  Head: Normocephalic and atraumatic.  Right Ear: External ear normal.  Mouth/Throat: Oropharynx is clear and moist.  Eyes: Conjunctivae are normal. Pupils are equal, round, and reactive to light.  Neck: Normal range of motion.  Cardiovascular: Normal rate.   Pulmonary/Chest: Effort normal.  Musculoskeletal: Normal range of motion.  Neurological: She is alert and oriented to person, place, and time.  Skin: Skin is warm.  Psychiatric: She has a normal mood and affect.    ED Course  Procedures (including critical care time)  Labs Reviewed - No data to display No results found.   No diagnosis found.    MDM  Pt given rx for clonidine,  Hydrocodone and pcn.  Pt referred to Dr. Burgess Estelle and Dr. Angela Nevin [place], [place] 11/12/11 2208

## 2011-11-12 NOTE — ED Notes (Signed)
Pt has dental abscess on right side of mouth. Pt needs tooth pulled but unable to afford.

## 2011-11-13 NOTE — ED Provider Notes (Signed)
Medical screening examination/treatment/procedure(s) were performed by non-physician practitioner and as supervising physician I was immediately available for consultation/collaboration.   Seraphim Trow A Rayah Fines, MD 11/13/11 0041 

## 2012-04-09 ENCOUNTER — Encounter (HOSPITAL_BASED_OUTPATIENT_CLINIC_OR_DEPARTMENT_OTHER): Payer: Self-pay | Admitting: *Deleted

## 2012-04-09 ENCOUNTER — Emergency Department (HOSPITAL_BASED_OUTPATIENT_CLINIC_OR_DEPARTMENT_OTHER)
Admission: EM | Admit: 2012-04-09 | Discharge: 2012-04-09 | Disposition: A | Discharge status: Home / Self Care | Payer: Self-pay | Attending: Emergency Medicine | Admitting: Emergency Medicine

## 2012-04-09 DIAGNOSIS — R609 Edema, unspecified: Secondary | ICD-10-CM | POA: Insufficient documentation

## 2012-04-09 DIAGNOSIS — I10 Essential (primary) hypertension: Secondary | ICD-10-CM | POA: Insufficient documentation

## 2012-04-09 DIAGNOSIS — K0889 Other specified disorders of teeth and supporting structures: Secondary | ICD-10-CM

## 2012-04-09 DIAGNOSIS — K089 Disorder of teeth and supporting structures, unspecified: Secondary | ICD-10-CM | POA: Insufficient documentation

## 2012-04-09 MED ORDER — TRAMADOL HCL 50 MG PO TABS: 50.0000 mg | ORAL_TABLET | Freq: Four times a day (QID) | ORAL | Status: AC | PRN

## 2012-04-09 MED ORDER — TRAMADOL HCL 50 MG PO TABS
50.0000 mg | ORAL_TABLET | Freq: Once | ORAL | Status: AC
  Administered 2012-04-09: 50 mg via ORAL
  Filled 2012-04-09: qty 1

## 2012-04-09 MED ORDER — PENICILLIN V POTASSIUM 250 MG PO TABS: 250.0000 mg | ORAL_TABLET | Freq: Four times a day (QID) | ORAL | Status: AC

## 2012-04-09 MED ORDER — PENICILLIN V POTASSIUM 250 MG PO TABS
250.0000 mg | ORAL_TABLET | Freq: Once | ORAL | Status: AC
  Administered 2012-04-09: 250 mg via ORAL
  Filled 2012-04-09: qty 1

## 2012-04-09 NOTE — ED Notes (Signed)
Pt c/o tooth pain that began 30 days ago. Right sided pain (back tooth). Slight edema present.

## 2012-04-09 NOTE — ED Provider Notes (Signed)
History     [name]: [id number]  Arrival date & time 04/09/12  4098   First MD Initiated Contact with Patient 04/09/12 [phone]      Chief Complaint  Patient presents with  . Dental Pain     HPI The patient presents with concerns over facial pain and irritation.  She notes that over the past month she has had waxing/waning pain and edema about the right posterior lower teeth.  Over the past days the pain has become more irritating, with radiation throughout the right mandible.  The pain is minimally improved with Tylenol, is worse with clenching of teeth or chewing.  She denies any new fevers, vomiting, nausea or confusion or other focal changes. The patient notes that she has recently obtained insurance and will see a dentist tomorrow, but do to the persistency of the edema in the pain she presents for evaluation today. Past Medical History  Diagnosis Date  . Hypertension     No past surgical history on file.  No family history on file.  History  Substance Use Topics  . Smoking status: Current Some Day Smoker  . Smokeless tobacco: Not on file  . Alcohol Use: Yes    OB History    Grav Para Term Preterm Abortions TAB SAB Ect Mult Living                  Review of Systems  All other systems reviewed and are negative.    Allergies  Shellfish allergy  Home Medications   Current Outpatient Rx  Name Route Sig Dispense Refill  . ACETAMINOPHEN 500 MG PO TABS Oral Take 1,000 mg by mouth once.      . BUPROPION HCL ER (XL) 300 MG PO TB24 Oral Take 300 mg by mouth every morning.      Marland Kitchen CLONIDINE HCL 0.1 MG PO TABS Oral Take 0.1 mg by mouth once.      Marland Kitchen CLONIDINE HCL 0.1 MG PO TABS Oral Take 1 tablet (0.1 mg total) by mouth 2 (two) times daily. 60 tablet 1  . IBUPROFEN 200 MG PO TABS Oral Take 800 mg by mouth every 6 (six) hours as needed. Tooth pain     . MULTI-VITAMIN/MINERALS PO TABS Oral Take 1 tablet by mouth daily.      Marland Kitchen PENICILLIN V POTASSIUM 250 MG PO TABS Oral Take 1  tablet (250 mg total) by mouth 4 (four) times daily. 28 tablet 0  . TRAMADOL HCL 50 MG PO TABS Oral Take 1 tablet (50 mg total) by mouth every 6 (six) hours as needed for pain. 15 tablet 0    There were no vitals taken for this visit.  Physical Exam  Nursing note and vitals reviewed. Constitutional: She is oriented to person, place, and time. She appears well-developed and well-nourished. No distress.  HENT:  Head: Normocephalic and atraumatic. No trismus in the jaw.    Mouth/Throat: Uvula is midline and mucous membranes are normal. She does not have dentures. No dental abscesses or uvula swelling. No oropharyngeal exudate, posterior oropharyngeal edema, posterior oropharyngeal erythema or tonsillar abscesses.    Eyes: Conjunctivae and EOM are normal.  Neck: Neck supple.  Pulmonary/Chest: Effort normal. No stridor. No respiratory distress.  Neurological: She is alert and oriented to person, place, and time. No cranial nerve deficit.  Skin: Skin is warm and dry.  Psychiatric: She has a normal mood and affect.    ED Course  Procedures (including critical care time)  Labs Reviewed -  No data to display No results found.   1. Pain, dental       MDM  This young female now presents with concerns over ongoing dental pain.  On my exam the patient is in no distress with unremarkable vital signs.  Patient has no evidence of a significant abscess formation, though with the erythema in the patient's description of pain there is concern for early infectious process.  The patient was provided penicillin and analgesics.  The patient is scheduled to see dentist tomorrow.  She is discharged in stable condition with explicit instructions to maintain a followup appointment.     Gerhard Munch, MD 04/09/12 [phone]

## 2012-04-09 NOTE — Discharge Instructions (Signed)
Dental Pain  A tooth ache may be caused by cavities (tooth decay). Cavities expose the nerve of the tooth to air and hot or cold temperatures. It may come from an infection or abscess (also called a boil or furuncle) around your tooth. It is also often caused by dental caries (tooth decay). This causes the pain you are having.  DIAGNOSIS   Your caregiver can diagnose this problem by exam.  TREATMENT   · If caused by an infection, it may be treated with medications which kill germs (antibiotics) and pain medications as prescribed by your caregiver. Take medications as directed.  · Only take over-the-counter or prescription medicines for pain, discomfort, or fever as directed by your caregiver.  · Whether the tooth ache today is caused by infection or dental disease, you should see your dentist as soon as possible for further care.  SEEK MEDICAL CARE IF:  The exam and treatment you received today has been provided on an emergency basis only. This is not a substitute for complete medical or dental care. If your problem worsens or new problems (symptoms) appear, and you are unable to meet with your dentist, call or return to this location.  SEEK IMMEDIATE MEDICAL CARE IF:   · You have a fever.  · You develop redness and swelling of your face, jaw, or neck.  · You are unable to open your mouth.  · You have severe pain uncontrolled by pain medicine.  MAKE SURE YOU:   · Understand these instructions.  · Will watch your condition.  · Will get help right away if you are not doing well or get worse.  Document Released: 11/25/2005 Document Revised: 11/14/2011 Document Reviewed: 07/13/2008  [employer]® Patient Information ©2012 [employer].

## 2012-07-03 ENCOUNTER — Encounter: Payer: BC Managed Care – PPO | Admitting: Obstetrics and Gynecology

## 2012-08-04 ENCOUNTER — Encounter (HOSPITAL_BASED_OUTPATIENT_CLINIC_OR_DEPARTMENT_OTHER): Payer: Self-pay | Admitting: *Deleted

## 2012-08-04 ENCOUNTER — Emergency Department (HOSPITAL_BASED_OUTPATIENT_CLINIC_OR_DEPARTMENT_OTHER)
Admission: EM | Admit: 2012-08-04 | Discharge: 2012-08-04 | Disposition: A | Discharge status: Home / Self Care | Payer: BC Managed Care – PPO | Attending: Emergency Medicine | Admitting: Emergency Medicine

## 2012-08-04 DIAGNOSIS — Z760 Encounter for issue of repeat prescription: Secondary | ICD-10-CM | POA: Insufficient documentation

## 2012-08-04 DIAGNOSIS — I10 Essential (primary) hypertension: Secondary | ICD-10-CM | POA: Insufficient documentation

## 2012-08-04 DIAGNOSIS — F3289 Other specified depressive episodes: Secondary | ICD-10-CM | POA: Insufficient documentation

## 2012-08-04 DIAGNOSIS — F17200 Nicotine dependence, unspecified, uncomplicated: Secondary | ICD-10-CM | POA: Insufficient documentation

## 2012-08-04 DIAGNOSIS — F329 Major depressive disorder, single episode, unspecified: Secondary | ICD-10-CM | POA: Insufficient documentation

## 2012-08-04 MED ORDER — CLONIDINE HCL 0.1 MG PO TABS
0.1000 mg | ORAL_TABLET | Freq: Once | ORAL | Status: AC
  Administered 2012-08-04: 0.1 mg via ORAL
  Filled 2012-08-04: qty 1

## 2012-08-04 MED ORDER — CLONIDINE HCL 0.1 MG PO TABS: 0.1000 mg | ORAL_TABLET | Freq: Two times a day (BID) | ORAL | Status: DC
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
## 2012-08-04 NOTE — ED Notes (Signed)
MD at bedside. 

## 2012-08-04 NOTE — ED Notes (Signed)
Pt. Reports pain in her neck and sees little dots in her eyes.

## 2012-09-03 ENCOUNTER — Other Ambulatory Visit (HOSPITAL_COMMUNITY)
Admission: RE | Admit: 2012-09-03 | Discharge: 2012-09-03 | Disposition: A | Discharge status: Home / Self Care | Payer: BC Managed Care – PPO | Source: Ambulatory Visit | Attending: Family Medicine | Admitting: Family Medicine

## 2012-09-03 ENCOUNTER — Ambulatory Visit (INDEPENDENT_AMBULATORY_CARE_PROVIDER_SITE_OTHER): Payer: BC Managed Care – PPO | Admitting: Family Medicine

## 2012-09-03 ENCOUNTER — Telehealth: Payer: Self-pay | Admitting: *Deleted

## 2012-09-03 ENCOUNTER — Encounter: Payer: Self-pay | Admitting: Family Medicine

## 2012-09-03 VITALS — BP 149/116 | HR 112 | Temp 99.6°F | Ht 65.0 in | Wt 203.0 lb

## 2012-09-03 DIAGNOSIS — N898 Other specified noninflammatory disorders of vagina: Secondary | ICD-10-CM

## 2012-09-03 DIAGNOSIS — Z87448 Personal history of other diseases of urinary system: Secondary | ICD-10-CM

## 2012-09-03 DIAGNOSIS — Z01419 Encounter for gynecological examination (general) (routine) without abnormal findings: Secondary | ICD-10-CM | POA: Insufficient documentation

## 2012-09-03 DIAGNOSIS — N760 Acute vaginitis: Secondary | ICD-10-CM

## 2012-09-03 DIAGNOSIS — Z113 Encounter for screening for infections with a predominantly sexual mode of transmission: Secondary | ICD-10-CM | POA: Insufficient documentation

## 2012-09-03 DIAGNOSIS — I10 Essential (primary) hypertension: Secondary | ICD-10-CM

## 2012-09-03 DIAGNOSIS — Z124 Encounter for screening for malignant neoplasm of cervix: Secondary | ICD-10-CM

## 2012-09-03 LAB — POCT WET PREP (WET MOUNT): Clue Cells Wet Prep Whiff POC: POSITIVE

## 2012-09-03 MED ORDER — HYDROCHLOROTHIAZIDE 25 MG PO TABS: 25.0000 mg | ORAL_TABLET | Freq: Every day | ORAL | Status: DC

## 2012-09-03 NOTE — Patient Instructions (Addendum)
Start the HCTZ tomorrow. Take Clonidine tonight.  Do not take it tomorrow morning.    Look over the birth control handout and let me know what you want to do.  Come back in 2-3 weeks so we can go over things.    On your way out make a lab appt.

## 2012-09-04 ENCOUNTER — Encounter: Payer: Self-pay | Admitting: Family Medicine

## 2012-09-04 ENCOUNTER — Telehealth: Payer: Self-pay | Admitting: Family Medicine

## 2012-09-04 DIAGNOSIS — N898 Other specified noninflammatory disorders of vagina: Secondary | ICD-10-CM | POA: Insufficient documentation

## 2012-09-04 MED ORDER — METRONIDAZOLE 500 MG PO TABS: 500.0000 mg | ORAL_TABLET | Freq: Two times a day (BID) | ORAL | Status: DC
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
## 2012-09-04 NOTE — Assessment & Plan Note (Signed)
Wet prep obtained.   Will call patient with results.  GC/Chlamydia also obtained.

## 2012-09-04 NOTE — Telephone Encounter (Signed)
Called and discussed bacterial vaginosis the patient. She had not her this before. Gave her description what this means. Patient appreciated. Flagyl for the next 5 days twice a day.

## 2012-09-04 NOTE — Assessment & Plan Note (Signed)
Switching her to hydrochlorothiazide. I do not think clonidine is good medicine for her. I'm afraid that she'll have persistent rebound hypertension from when she forgets to take her medication. Checking CMET and CBC today since she has had hypertension since birth of her 29-year-old son. FU in 1 month

## 2012-09-07 ENCOUNTER — Encounter: Payer: Self-pay | Admitting: Family Medicine

## 2012-09-08 NOTE — Telephone Encounter (Signed)
Error

## 2012-09-14 ENCOUNTER — Encounter: Payer: BC Managed Care – PPO | Admitting: Obstetrics and Gynecology

## 2012-09-17 ENCOUNTER — Other Ambulatory Visit: Payer: BC Managed Care – PPO

## 2012-09-17 ENCOUNTER — Ambulatory Visit (INDEPENDENT_AMBULATORY_CARE_PROVIDER_SITE_OTHER): Payer: BC Managed Care – PPO | Admitting: Family Medicine

## 2012-09-17 ENCOUNTER — Encounter: Payer: Self-pay | Admitting: Family Medicine

## 2012-09-17 VITALS — BP 145/90 | HR 118 | Temp 99.2°F | Ht 65.0 in | Wt 204.0 lb

## 2012-09-17 DIAGNOSIS — IMO0001 Reserved for inherently not codable concepts without codable children: Secondary | ICD-10-CM

## 2012-09-17 DIAGNOSIS — Z309 Encounter for contraceptive management, unspecified: Secondary | ICD-10-CM

## 2012-09-17 DIAGNOSIS — I10 Essential (primary) hypertension: Secondary | ICD-10-CM

## 2012-09-17 DIAGNOSIS — Z3046 Encounter for surveillance of implantable subdermal contraceptive: Secondary | ICD-10-CM

## 2012-09-17 DIAGNOSIS — Z87448 Personal history of other diseases of urinary system: Secondary | ICD-10-CM

## 2012-09-17 NOTE — Progress Notes (Signed)
CMP AND CBC DONE TODAY Lisa Crosby 

## 2012-09-18 ENCOUNTER — Telehealth: Payer: Self-pay | Admitting: Family Medicine

## 2012-09-18 DIAGNOSIS — Z3046 Encounter for surveillance of implantable subdermal contraceptive: Secondary | ICD-10-CM | POA: Insufficient documentation

## 2012-09-18 DIAGNOSIS — IMO0001 Reserved for inherently not codable concepts without codable children: Secondary | ICD-10-CM | POA: Insufficient documentation

## 2012-09-18 DIAGNOSIS — Z975 Presence of (intrauterine) contraceptive device: Secondary | ICD-10-CM | POA: Insufficient documentation

## 2012-09-18 LAB — COMPREHENSIVE METABOLIC PANEL
ALT: 12 U/L (ref 0–35)
Albumin: 4.3 g/dL (ref 3.5–5.2)
CO2: 29 mEq/L (ref 19–32)
Calcium: 9.7 mg/dL (ref 8.4–10.5)
Chloride: 102 mEq/L (ref 96–112)
Glucose, Bld: 107 mg/dL — ABNORMAL HIGH (ref 70–99)
Sodium: 137 mEq/L (ref 135–145)
Total Protein: 7.3 g/dL (ref 6.0–8.3)

## 2012-09-18 LAB — CBC
Hemoglobin: 13.6 g/dL (ref 12.0–15.0)
Platelets: 363 10*3/uL (ref 150–400)
RBC: 4.56 MIL/uL (ref 3.87–5.11)
WBC: 7.6 10*3/uL (ref 4.0–10.5)

## 2012-09-18 NOTE — Assessment & Plan Note (Signed)
Does not want any further contraception, trying to get pregnant.

## 2012-09-18 NOTE — Telephone Encounter (Signed)
Patient called had Nexplanon removed from LT arm yesterday.  Patient says it continues to bleed, soaks through band-aid and gauze.  Painful at site - 7/10.  She took Tylenol 3 tablets 5 hours ago without much relief.  She is more concerned about the bleeding.  Advised patient to have a friend wrap gauze around incision tightly.  She should put pressure on wound for about 30 minutes.  If bleeding does not stop, then she may need to come to [facility] or ED to evaluate for sutures.  She understood plan.

## 2012-09-18 NOTE — Assessment & Plan Note (Signed)
See procedure note.   No complications.

## 2012-09-18 NOTE — Progress Notes (Signed)
  Subjective:    Patient ID: Lisa Crosby, female    DOB: [birth year], 31 y.o.   MRN: [id number]  HPI  1.  Implanon removal:  Patient here for removal, has been present for 5 years (maybe even closer to 6, according to patient).  She has not been taking anything else for birth control. She would like to become pregnant and doesn't desire any future contraception.  No redness or swelling in her arm.   Review of Systems See HPI above for review of systems.       Objective:   Physical Exam Gen:  Alert, cooperative patient who appears stated age in no acute distress.  Vital signs reviewed. Skin:  Able to palpate Implanon beneath skin.  No signs of infection.  No tenderness to palpation.   Procedure note:  Informed consent given and signed copy in chart. Appropriate time out taken. Area prepped and draped in usual sterile fashion - iodine swabs x 3 followed by alcohol swab to reduce skin staining from iodine. Local anasthesia with 2% lidocaine with epi (8 cc). Small incision made at distal end of implanon rod with 11 blade scalpel after area had been marked prior to sterilization.  Able to visualize the tip of the Implanon rod and this was grasped with sterile hemostats.  Easily removed.   Area was cleaned and topical abx ointment applied. Bandage applied. No complications. EBL < 5 cc.         Assessment & Plan:

## 2012-09-21 ENCOUNTER — Encounter: Payer: Self-pay | Admitting: Family Medicine

## 2012-10-05 ENCOUNTER — Encounter (HOSPITAL_BASED_OUTPATIENT_CLINIC_OR_DEPARTMENT_OTHER): Payer: Self-pay | Admitting: *Deleted

## 2012-10-05 ENCOUNTER — Emergency Department (HOSPITAL_BASED_OUTPATIENT_CLINIC_OR_DEPARTMENT_OTHER)
Admission: EM | Admit: 2012-10-05 | Discharge: 2012-10-05 | Disposition: A | Discharge status: Home / Self Care | Payer: BC Managed Care – PPO | Attending: Emergency Medicine | Admitting: Emergency Medicine

## 2012-10-05 DIAGNOSIS — Z8619 Personal history of other infectious and parasitic diseases: Secondary | ICD-10-CM | POA: Insufficient documentation

## 2012-10-05 DIAGNOSIS — I10 Essential (primary) hypertension: Secondary | ICD-10-CM | POA: Insufficient documentation

## 2012-10-05 DIAGNOSIS — E669 Obesity, unspecified: Secondary | ICD-10-CM | POA: Insufficient documentation

## 2012-10-05 DIAGNOSIS — Z862 Personal history of diseases of the blood and blood-forming organs and certain disorders involving the immune mechanism: Secondary | ICD-10-CM | POA: Insufficient documentation

## 2012-10-05 DIAGNOSIS — F3289 Other specified depressive episodes: Secondary | ICD-10-CM | POA: Insufficient documentation

## 2012-10-05 DIAGNOSIS — K0889 Other specified disorders of teeth and supporting structures: Secondary | ICD-10-CM

## 2012-10-05 DIAGNOSIS — F17200 Nicotine dependence, unspecified, uncomplicated: Secondary | ICD-10-CM | POA: Insufficient documentation

## 2012-10-05 DIAGNOSIS — Z872 Personal history of diseases of the skin and subcutaneous tissue: Secondary | ICD-10-CM | POA: Insufficient documentation

## 2012-10-05 DIAGNOSIS — IMO0002 Reserved for concepts with insufficient information to code with codable children: Secondary | ICD-10-CM | POA: Insufficient documentation

## 2012-10-05 DIAGNOSIS — Z87440 Personal history of urinary (tract) infections: Secondary | ICD-10-CM | POA: Insufficient documentation

## 2012-10-05 DIAGNOSIS — Z8639 Personal history of other endocrine, nutritional and metabolic disease: Secondary | ICD-10-CM | POA: Insufficient documentation

## 2012-10-05 DIAGNOSIS — Z79899 Other long term (current) drug therapy: Secondary | ICD-10-CM | POA: Insufficient documentation

## 2012-10-05 DIAGNOSIS — F329 Major depressive disorder, single episode, unspecified: Secondary | ICD-10-CM | POA: Insufficient documentation

## 2012-10-05 DIAGNOSIS — K029 Dental caries, unspecified: Secondary | ICD-10-CM | POA: Insufficient documentation

## 2012-10-05 DIAGNOSIS — Z87448 Personal history of other diseases of urinary system: Secondary | ICD-10-CM | POA: Insufficient documentation

## 2012-10-05 MED ORDER — PENICILLIN V POTASSIUM 500 MG PO TABS: 500.0000 mg | ORAL_TABLET | Freq: Four times a day (QID) | ORAL | Status: AC

## 2012-10-05 MED ORDER — HYDROCODONE-ACETAMINOPHEN 5-325 MG PO TABS
1.0000 | ORAL_TABLET | Freq: Once | ORAL | Status: AC
  Administered 2012-10-05: 1 via ORAL
  Filled 2012-10-05: qty 1

## 2012-10-05 MED ORDER — PENICILLIN V POTASSIUM 250 MG PO TABS
500.0000 mg | ORAL_TABLET | Freq: Once | ORAL | Status: AC
  Administered 2012-10-05: 500 mg via ORAL

## 2012-10-05 MED ORDER — PENICILLIN V POTASSIUM 250 MG PO TABS
ORAL_TABLET | ORAL | Status: AC
  Administered 2012-10-05: 500 mg via ORAL
  Filled 2012-10-05: qty 2

## 2012-10-05 MED ORDER — HYDROCODONE-ACETAMINOPHEN 5-325 MG PO TABS: 1.0000 | ORAL_TABLET | Freq: Four times a day (QID) | ORAL | Status: DC | PRN

## 2012-10-05 NOTE — ED Notes (Signed)
C/o right lower tooth pain that started last pm. Denies fever. Right lower tooth with noted decay on exam.
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
## 2012-10-05 NOTE — ED Notes (Signed)
Pt. Had a ride home.

## 2012-10-12 ENCOUNTER — Encounter: Payer: Self-pay | Admitting: Family Medicine

## 2012-10-12 NOTE — Telephone Encounter (Signed)
This encounter was created in error - please disregard.

## 2012-10-12 NOTE — Telephone Encounter (Signed)
error 

## 2012-10-13 ENCOUNTER — Ambulatory Visit (INDEPENDENT_AMBULATORY_CARE_PROVIDER_SITE_OTHER): Payer: BC Managed Care – PPO | Admitting: Family Medicine

## 2012-10-13 ENCOUNTER — Encounter: Payer: Self-pay | Admitting: Family Medicine

## 2012-10-13 VITALS — BP 124/71 | HR 93 | Temp 98.9°F | Ht 65.0 in | Wt 209.0 lb

## 2012-10-13 DIAGNOSIS — K029 Dental caries, unspecified: Secondary | ICD-10-CM

## 2012-10-13 MED ORDER — IBUPROFEN 800 MG PO TABS: 800.0000 mg | ORAL_TABLET | Freq: Three times a day (TID) | ORAL | Status: DC | PRN

## 2012-10-13 MED ORDER — AMOXICILLIN-POT CLAVULANATE 875-125 MG PO TABS: 1.0000 | ORAL_TABLET | Freq: Two times a day (BID) | ORAL | Status: DC

## 2012-10-13 MED ORDER — ACETAMINOPHEN-CODEINE #3 300-30 MG PO TABS: 1.0000 | ORAL_TABLET | Freq: Every evening | ORAL | Status: DC | PRN

## 2012-10-13 NOTE — Patient Instructions (Addendum)

## 2012-10-13 NOTE — Progress Notes (Signed)
  Subjective:    Patient ID: Lisa Crosby, female    DOB: August 20, 1981, 31 y.o.   MRN: [id number]  HPI 31 y.o. female with right lower tooth pain. Draining white pus from gum. No fever,chills. Pain is not controlled with hydrocodone provided by ED. Also took penicillin prescribed at ED and did not help. Has not made dental appointment yet because states dentist won't do anything while tooth is infected.   Review of Systems  Constitutional: Negative for fever and chills.  HENT: Positive for dental problem. Negative for sore throat, facial swelling, trouble swallowing and sinus pressure.   Respiratory: Negative for chest tightness and shortness of breath.   Cardiovascular: Negative for chest pain.  Gastrointestinal: Negative for nausea and vomiting.  Neurological: Negative for dizziness and weakness.       Objective:   Physical Exam  Constitutional: She is oriented to person, place, and time. She appears well-developed and well-nourished. No distress.  HENT:  Head: Normocephalic and atraumatic.       Right next to back molar decayed and broken. Some darkening of pigmentation of lower buccal gingiva. No swelling or redness. No drainage but a small white spot seen on gingiva under affected tooth. Mucous membranes moist.  Neck: Normal range of motion. Neck supple.  Cardiovascular: Normal rate and regular rhythm.   Pulmonary/Chest: Effort normal. No respiratory distress.  Neurological: She is alert and oriented to person, place, and time.  Skin: Skin is warm and dry.  Psychiatric: She has a normal mood and affect.      Assessment & Plan:  31 y.o. female with dental pain. -- Tylenol #3 for night time pain --  Motrin 800 mg TID -- Augmentin for 7 days -- Pt told she needs to see dentist regardless of infection - may need drainage, xray or other treatment.

## 2012-11-01 ENCOUNTER — Emergency Department (HOSPITAL_COMMUNITY)
Admission: EM | Admit: 2012-11-01 | Discharge: 2012-11-01 | Disposition: A | Discharge status: Home / Self Care | Payer: BC Managed Care – PPO | Attending: Emergency Medicine | Admitting: Emergency Medicine

## 2012-11-01 DIAGNOSIS — K047 Periapical abscess without sinus: Secondary | ICD-10-CM | POA: Insufficient documentation

## 2012-11-01 DIAGNOSIS — Z79899 Other long term (current) drug therapy: Secondary | ICD-10-CM | POA: Insufficient documentation

## 2012-11-01 DIAGNOSIS — E669 Obesity, unspecified: Secondary | ICD-10-CM | POA: Insufficient documentation

## 2012-11-01 DIAGNOSIS — N159 Renal tubulo-interstitial disease, unspecified: Secondary | ICD-10-CM | POA: Insufficient documentation

## 2012-11-01 DIAGNOSIS — F329 Major depressive disorder, single episode, unspecified: Secondary | ICD-10-CM | POA: Insufficient documentation

## 2012-11-01 DIAGNOSIS — F3289 Other specified depressive episodes: Secondary | ICD-10-CM | POA: Insufficient documentation

## 2012-11-01 DIAGNOSIS — F17200 Nicotine dependence, unspecified, uncomplicated: Secondary | ICD-10-CM | POA: Insufficient documentation

## 2012-11-01 DIAGNOSIS — Z87440 Personal history of urinary (tract) infections: Secondary | ICD-10-CM | POA: Insufficient documentation

## 2012-11-01 DIAGNOSIS — Z8619 Personal history of other infectious and parasitic diseases: Secondary | ICD-10-CM | POA: Insufficient documentation

## 2012-11-01 DIAGNOSIS — I10 Essential (primary) hypertension: Secondary | ICD-10-CM | POA: Insufficient documentation

## 2012-11-01 MED ORDER — IBUPROFEN 800 MG PO TABS: 800.0000 mg | ORAL_TABLET | Freq: Three times a day (TID) | ORAL | Status: DC

## 2012-11-01 MED ORDER — BUPIVACAINE HCL (PF) 0.5 % IJ SOLN: 5.0000 mL | Freq: Once | INTRAMUSCULAR | Status: DC

## 2012-11-01 MED ORDER — HYDROCODONE-ACETAMINOPHEN 7.5-500 MG/15ML PO SOLN: 15.0000 mL | Freq: Four times a day (QID) | ORAL | Status: DC | PRN

## 2012-11-01 MED ORDER — CLINDAMYCIN HCL 150 MG PO CAPS: 150.0000 mg | ORAL_CAPSULE | Freq: Four times a day (QID) | ORAL | Status: DC

## 2012-11-01 MED ORDER — BUPIVACAINE-EPINEPHRINE PF 0.5-1:200000 % IJ SOLN
INTRAMUSCULAR | Status: AC
  Filled 2012-11-01: qty 3.6

## 2012-11-01 NOTE — ED Notes (Signed)
Pt has abscess on right tooth and has seen dentist but cannot afford at this time to have tooth pulled and she is also out of antibiotic for tooth and has no pain medication,  She has been taking BC powders and tylenol but no relief.  Pt is tearful
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
## 2012-12-26 ENCOUNTER — Telehealth: Payer: Self-pay | Admitting: Family Medicine

## 2012-12-26 NOTE — Telephone Encounter (Signed)
She has boil near vulvar area that has a whitehead and is very painful. She does not want to go to UC/ED because of cost. She is asking what to do.  -Warm compresses to allow boil to come to a head -Ibuprofen as needed for pain -See a provider if fevers, nausea/vomiting.

## 2013-02-04 ENCOUNTER — Ambulatory Visit: Payer: BC Managed Care – PPO | Admitting: Family Medicine

## 2013-02-05 ENCOUNTER — Ambulatory Visit: Payer: BC Managed Care – PPO | Admitting: Family Medicine

## 2013-02-12 ENCOUNTER — Ambulatory Visit: Payer: BC Managed Care – PPO | Admitting: Family Medicine

## 2013-02-16 ENCOUNTER — Ambulatory Visit: Payer: BC Managed Care – PPO | Admitting: Family Medicine

## 2013-02-17 ENCOUNTER — Ambulatory Visit: Payer: BC Managed Care – PPO | Admitting: Family Medicine

## 2013-02-25 ENCOUNTER — Ambulatory Visit (INDEPENDENT_AMBULATORY_CARE_PROVIDER_SITE_OTHER): Payer: 59 | Admitting: Family Medicine

## 2013-02-25 ENCOUNTER — Encounter: Payer: Self-pay | Admitting: Family Medicine

## 2013-02-25 VITALS — BP 118/89 | HR 90 | Temp 99.9°F | Ht 65.0 in | Wt 208.0 lb

## 2013-02-25 DIAGNOSIS — F1010 Alcohol abuse, uncomplicated: Secondary | ICD-10-CM

## 2013-02-25 DIAGNOSIS — F411 Generalized anxiety disorder: Secondary | ICD-10-CM

## 2013-02-25 MED ORDER — LORAZEPAM 0.5 MG PO TABS: 0.5000 mg | ORAL_TABLET | Freq: Every day | ORAL | Status: DC | PRN

## 2013-02-25 MED ORDER — CITALOPRAM HYDROBROMIDE 20 MG PO TABS: 20.0000 mg | ORAL_TABLET | Freq: Every day | ORAL | Status: DC

## 2013-02-25 NOTE — Patient Instructions (Addendum)
What you are going through is something lots of people go through.  I will refer you to the [facility].    Take the Ativan 1 pill a day only when you need it.   Take the Celexa 1 pill a day everyday.    Come back and see me in 1-2 weeks for the paperwork and see you for the anxiety.

## 2013-02-26 ENCOUNTER — Encounter: Payer: Self-pay | Admitting: Family Medicine

## 2013-02-26 DIAGNOSIS — F411 Generalized anxiety disorder: Secondary | ICD-10-CM | POA: Insufficient documentation

## 2013-02-26 DIAGNOSIS — F1010 Alcohol abuse, uncomplicated: Secondary | ICD-10-CM | POA: Insufficient documentation

## 2013-02-26 NOTE — Assessment & Plan Note (Signed)
Counseled to cut back on alcohol use and not to mix Ativan with Alcohol. Discussed how goal of counseling and medication use will be to end reliance upon alcohol use.  Declined any resources for alcohol counseling.

## 2013-02-26 NOTE — Assessment & Plan Note (Signed)
New onset. Patient interested in seeing therapist.  Recommended calling [facility], provided her handout with contact information.  Lorazepam bridge along with Celexa initiation. FU in 2 weeks to assess for improvement. I also wrote her out of work for the next several days to provide her some time for coping.
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
## 2013-03-03 ENCOUNTER — Telehealth: Payer: Self-pay | Admitting: Family Medicine

## 2013-03-03 ENCOUNTER — Ambulatory Visit: Payer: 59 | Admitting: Family Medicine

## 2013-03-03 NOTE — Telephone Encounter (Signed)
Done and placed in to be called box.

## 2013-03-03 NOTE — Telephone Encounter (Signed)
Pt was supposed to be here to day to full out FMLA paper she needs a note to extend until Monday at her next appt.  Needs the same note from last week but just extend date until Monday's appt at 4:00

## 2013-03-04 ENCOUNTER — Other Ambulatory Visit: Payer: Self-pay | Admitting: Family Medicine

## 2013-03-08 ENCOUNTER — Ambulatory Visit (INDEPENDENT_AMBULATORY_CARE_PROVIDER_SITE_OTHER): Payer: 59 | Admitting: Family Medicine

## 2013-03-08 VITALS — BP 147/85 | HR 122 | Temp 99.8°F | Ht 65.0 in | Wt 208.0 lb

## 2013-03-08 DIAGNOSIS — F411 Generalized anxiety disorder: Secondary | ICD-10-CM
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
## 2013-03-08 NOTE — Patient Instructions (Signed)
Take the Celexa 2 pills a day until you run out.  I'll send in a new prescription.    Come back and see me in 1-2 weeks   Call the [facility].  The other option is Dr. Pascal Lux here.    Remember what we talked about for a night time routine.  No computers or TV at bed-time!

## 2013-03-09 NOTE — Assessment & Plan Note (Signed)
Slowly improving, but only minimally. Increasing Celexa to 40 mg daily.   Encouraged her to call [facility] for counseling.  Also provided her with Dr. Carola Rhine phone number.   Completed FMLA paperwork.  Will schedule FU in 2 weeks to assess for improvement.

## 2013-04-07 ENCOUNTER — Other Ambulatory Visit: Payer: Self-pay | Admitting: Family Medicine

## 2013-04-08 ENCOUNTER — Ambulatory Visit: Payer: 59 | Admitting: Family Medicine

## 2013-04-14 ENCOUNTER — Ambulatory Visit: Payer: 59 | Admitting: Family Medicine

## 2013-04-26 ENCOUNTER — Emergency Department (HOSPITAL_BASED_OUTPATIENT_CLINIC_OR_DEPARTMENT_OTHER)
Admission: EM | Admit: 2013-04-26 | Discharge: 2013-04-26 | Disposition: A | Discharge status: Home / Self Care | Payer: 59 | Attending: Emergency Medicine | Admitting: Emergency Medicine

## 2013-04-26 ENCOUNTER — Encounter (HOSPITAL_BASED_OUTPATIENT_CLINIC_OR_DEPARTMENT_OTHER): Payer: Self-pay | Admitting: *Deleted

## 2013-04-26 ENCOUNTER — Emergency Department (HOSPITAL_BASED_OUTPATIENT_CLINIC_OR_DEPARTMENT_OTHER): Payer: 59

## 2013-04-26 DIAGNOSIS — F3289 Other specified depressive episodes: Secondary | ICD-10-CM | POA: Insufficient documentation

## 2013-04-26 DIAGNOSIS — F329 Major depressive disorder, single episode, unspecified: Secondary | ICD-10-CM | POA: Insufficient documentation

## 2013-04-26 DIAGNOSIS — E669 Obesity, unspecified: Secondary | ICD-10-CM | POA: Insufficient documentation

## 2013-04-26 DIAGNOSIS — R079 Chest pain, unspecified: Secondary | ICD-10-CM

## 2013-04-26 DIAGNOSIS — Z8619 Personal history of other infectious and parasitic diseases: Secondary | ICD-10-CM | POA: Insufficient documentation

## 2013-04-26 DIAGNOSIS — Z87440 Personal history of urinary (tract) infections: Secondary | ICD-10-CM | POA: Insufficient documentation

## 2013-04-26 DIAGNOSIS — Z8742 Personal history of other diseases of the female genital tract: Secondary | ICD-10-CM | POA: Insufficient documentation

## 2013-04-26 DIAGNOSIS — Z79899 Other long term (current) drug therapy: Secondary | ICD-10-CM | POA: Insufficient documentation

## 2013-04-26 DIAGNOSIS — F17200 Nicotine dependence, unspecified, uncomplicated: Secondary | ICD-10-CM | POA: Insufficient documentation

## 2013-04-26 DIAGNOSIS — Z3202 Encounter for pregnancy test, result negative: Secondary | ICD-10-CM | POA: Insufficient documentation

## 2013-04-26 DIAGNOSIS — Z872 Personal history of diseases of the skin and subcutaneous tissue: Secondary | ICD-10-CM | POA: Insufficient documentation

## 2013-04-26 DIAGNOSIS — F411 Generalized anxiety disorder: Secondary | ICD-10-CM | POA: Insufficient documentation

## 2013-04-26 DIAGNOSIS — R0789 Other chest pain: Secondary | ICD-10-CM | POA: Insufficient documentation

## 2013-04-26 LAB — URINALYSIS, ROUTINE W REFLEX MICROSCOPIC
Glucose, UA: NEGATIVE mg/dL
Ketones, ur: NEGATIVE mg/dL
Leukocytes, UA: NEGATIVE
Nitrite: NEGATIVE
Protein, ur: NEGATIVE mg/dL
Urobilinogen, UA: 0.2 mg/dL (ref 0.0–1.0)

## 2013-04-26 MED ORDER — IBUPROFEN 800 MG PO TABS
800.0000 mg | ORAL_TABLET | Freq: Once | ORAL | Status: AC
  Administered 2013-04-26: 800 mg via ORAL
  Filled 2013-04-26: qty 1

## 2013-04-26 MED ORDER — ONDANSETRON 8 MG PO TBDP
8.0000 mg | ORAL_TABLET | Freq: Once | ORAL | Status: AC
  Administered 2013-04-26: 8 mg via ORAL
  Filled 2013-04-26: qty 1

## 2013-04-26 NOTE — ED Notes (Signed)
Pt states that she cannot provide a urine specimen at this time.  Will check back shortly.    

## 2013-04-26 NOTE — ED Notes (Signed)
Pt requesting see MD dr Bebe Shaggy and myself at bedside MD checking oxygen saturation which is 99% and heart rate is 75 and regular. Pt advised to follow up if no improvement

## 2013-04-26 NOTE — ED Notes (Signed)
Reports pain that starts underneath breast bone and radiates up into mid chest describes as a tight burning that started after she ate a sandwich this morning pt denies nausea or vomiting denies any radiation or pain.
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
## 2013-05-02 ENCOUNTER — Emergency Department (HOSPITAL_COMMUNITY)
Admission: EM | Admit: 2013-05-02 | Discharge: 2013-05-02 | Disposition: A | Discharge status: Home / Self Care | Payer: 59 | Attending: Emergency Medicine | Admitting: Emergency Medicine

## 2013-05-02 ENCOUNTER — Emergency Department (HOSPITAL_COMMUNITY): Payer: 59

## 2013-05-02 DIAGNOSIS — F3289 Other specified depressive episodes: Secondary | ICD-10-CM | POA: Insufficient documentation

## 2013-05-02 DIAGNOSIS — Z8619 Personal history of other infectious and parasitic diseases: Secondary | ICD-10-CM | POA: Insufficient documentation

## 2013-05-02 DIAGNOSIS — Z87440 Personal history of urinary (tract) infections: Secondary | ICD-10-CM | POA: Insufficient documentation

## 2013-05-02 DIAGNOSIS — F329 Major depressive disorder, single episode, unspecified: Secondary | ICD-10-CM | POA: Insufficient documentation

## 2013-05-02 DIAGNOSIS — Z79899 Other long term (current) drug therapy: Secondary | ICD-10-CM | POA: Insufficient documentation

## 2013-05-02 DIAGNOSIS — E669 Obesity, unspecified: Secondary | ICD-10-CM | POA: Insufficient documentation

## 2013-05-02 DIAGNOSIS — IMO0002 Reserved for concepts with insufficient information to code with codable children: Secondary | ICD-10-CM | POA: Insufficient documentation

## 2013-05-02 DIAGNOSIS — F17200 Nicotine dependence, unspecified, uncomplicated: Secondary | ICD-10-CM | POA: Insufficient documentation

## 2013-05-02 DIAGNOSIS — K219 Gastro-esophageal reflux disease without esophagitis: Secondary | ICD-10-CM | POA: Insufficient documentation

## 2013-05-02 DIAGNOSIS — Z872 Personal history of diseases of the skin and subcutaneous tissue: Secondary | ICD-10-CM | POA: Insufficient documentation

## 2013-05-02 DIAGNOSIS — I10 Essential (primary) hypertension: Secondary | ICD-10-CM | POA: Insufficient documentation

## 2013-05-02 LAB — POCT I-STAT TROPONIN I: Troponin i, poc: 0.01 ng/mL (ref 0.00–0.08)

## 2013-05-02 LAB — CBC
Hemoglobin: 13.3 g/dL (ref 12.0–15.0)
Platelets: 328 10*3/uL (ref 150–400)
RBC: 4.38 MIL/uL (ref 3.87–5.11)
WBC: 11.6 10*3/uL — ABNORMAL HIGH (ref 4.0–10.5)

## 2013-05-02 LAB — HEPATIC FUNCTION PANEL
AST: 13 U/L (ref 0–37)
Albumin: 3 g/dL — ABNORMAL LOW (ref 3.5–5.2)
Total Protein: 6.8 g/dL (ref 6.0–8.3)

## 2013-05-02 LAB — BASIC METABOLIC PANEL
CO2: 25 mEq/L (ref 19–32)
Chloride: 105 mEq/L (ref 96–112)
Glucose, Bld: 85 mg/dL (ref 70–99)
Sodium: 138 mEq/L (ref 135–145)

## 2013-05-02 MED ORDER — HYDROCODONE-ACETAMINOPHEN 5-325 MG PO TABS: 1.0000 | ORAL_TABLET | Freq: Four times a day (QID) | ORAL | Status: DC | PRN

## 2013-05-02 MED ORDER — ONDANSETRON HCL 4 MG/2ML IJ SOLN
4.0000 mg | Freq: Once | INTRAMUSCULAR | Status: AC
  Administered 2013-05-02: 4 mg via INTRAVENOUS
  Filled 2013-05-02: qty 2

## 2013-05-02 MED ORDER — GI COCKTAIL ~~LOC~~
30.0000 mL | Freq: Once | ORAL | Status: AC
  Administered 2013-05-02: 30 mL via ORAL
  Filled 2013-05-02: qty 30

## 2013-05-02 MED ORDER — TRAMADOL HCL 50 MG PO TABS: 50.0000 mg | ORAL_TABLET | Freq: Four times a day (QID) | ORAL | Status: DC | PRN

## 2013-05-02 MED ORDER — PANTOPRAZOLE SODIUM 20 MG PO TBEC: 20.0000 mg | DELAYED_RELEASE_TABLET | Freq: Every day | ORAL | Status: DC

## 2013-05-02 MED ORDER — HYDROMORPHONE HCL PF 1 MG/ML IJ SOLN
1.0000 mg | Freq: Once | INTRAMUSCULAR | Status: AC
  Administered 2013-05-02: 1 mg via INTRAVENOUS
  Filled 2013-05-02: qty 1

## 2013-05-02 NOTE — ED Notes (Signed)
Attempted to d/c pt she is stating her pain is back and also pt sts she can't take tramadol. "makes me feel weird". Dr Estell Harpin notified, awaiting for new d/c orders.

## 2013-05-02 NOTE — ED Notes (Signed)
Pt present to ed after driving self in  Because of  Mid chest pain that started at 3 am 05/02/13. Patient is having sob occasional diaphoresis , denies n/v.   Hx of htn.
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
## 2013-05-10 ENCOUNTER — Ambulatory Visit: Payer: 59 | Admitting: Family Medicine

## 2013-05-11 ENCOUNTER — Other Ambulatory Visit: Payer: Self-pay | Admitting: Family Medicine

## 2013-05-11 MED ORDER — HYDROCHLOROTHIAZIDE 25 MG PO TABS: 25.0000 mg | ORAL_TABLET | Freq: Every day | ORAL | Status: DC

## 2013-05-28 ENCOUNTER — Ambulatory Visit: Payer: 59 | Admitting: Family Medicine

## 2013-11-02 ENCOUNTER — Encounter (HOSPITAL_COMMUNITY): Payer: Self-pay | Admitting: Emergency Medicine

## 2013-11-02 ENCOUNTER — Emergency Department (HOSPITAL_COMMUNITY)
Admission: EM | Admit: 2013-11-02 | Discharge: 2013-11-02 | Disposition: A | Discharge status: Home / Self Care | Payer: Self-pay | Attending: Emergency Medicine | Admitting: Emergency Medicine

## 2013-11-02 ENCOUNTER — Emergency Department (HOSPITAL_COMMUNITY): Payer: Self-pay

## 2013-11-02 DIAGNOSIS — Z3202 Encounter for pregnancy test, result negative: Secondary | ICD-10-CM | POA: Insufficient documentation

## 2013-11-02 DIAGNOSIS — I10 Essential (primary) hypertension: Secondary | ICD-10-CM | POA: Insufficient documentation

## 2013-11-02 DIAGNOSIS — N6459 Other signs and symptoms in breast: Secondary | ICD-10-CM | POA: Insufficient documentation

## 2013-11-02 DIAGNOSIS — R059 Cough, unspecified: Secondary | ICD-10-CM | POA: Insufficient documentation

## 2013-11-02 DIAGNOSIS — R05 Cough: Secondary | ICD-10-CM | POA: Insufficient documentation

## 2013-11-02 DIAGNOSIS — F17200 Nicotine dependence, unspecified, uncomplicated: Secondary | ICD-10-CM | POA: Insufficient documentation

## 2013-11-02 DIAGNOSIS — R0789 Other chest pain: Secondary | ICD-10-CM | POA: Insufficient documentation

## 2013-11-02 DIAGNOSIS — Z8619 Personal history of other infectious and parasitic diseases: Secondary | ICD-10-CM | POA: Insufficient documentation

## 2013-11-02 DIAGNOSIS — F329 Major depressive disorder, single episode, unspecified: Secondary | ICD-10-CM | POA: Insufficient documentation

## 2013-11-02 DIAGNOSIS — N644 Mastodynia: Secondary | ICD-10-CM | POA: Insufficient documentation

## 2013-11-02 DIAGNOSIS — F3289 Other specified depressive episodes: Secondary | ICD-10-CM | POA: Insufficient documentation

## 2013-11-02 DIAGNOSIS — Z79899 Other long term (current) drug therapy: Secondary | ICD-10-CM | POA: Insufficient documentation

## 2013-11-02 DIAGNOSIS — N6452 Nipple discharge: Secondary | ICD-10-CM

## 2013-11-02 LAB — POCT I-STAT TROPONIN I: Troponin i, poc: 0 ng/mL (ref 0.00–0.08)

## 2013-11-02 LAB — POCT PREGNANCY, URINE: Preg Test, Ur: NEGATIVE

## 2013-11-02 MED ORDER — IBUPROFEN 600 MG PO TABS: 600.0000 mg | ORAL_TABLET | Freq: Three times a day (TID) | ORAL | Status: DC | PRN

## 2013-11-02 MED ORDER — IBUPROFEN 200 MG PO TABS
600.0000 mg | ORAL_TABLET | Freq: Once | ORAL | Status: AC
  Administered 2013-11-02: 600 mg via ORAL
  Filled 2013-11-02: qty 3

## 2013-11-02 MED ORDER — OXYCODONE-ACETAMINOPHEN 5-325 MG PO TABS
1.0000 | ORAL_TABLET | Freq: Once | ORAL | Status: AC
  Administered 2013-11-02: 1 via ORAL
  Filled 2013-11-02: qty 1

## 2013-11-02 MED ORDER — OXYCODONE-ACETAMINOPHEN 5-325 MG PO TABS: 1.0000 | ORAL_TABLET | ORAL | Status: DC | PRN

## 2013-11-02 NOTE — ED Notes (Signed)
Per pt, started having right sided chest pain three days ago-no trauma
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
## 2013-11-02 NOTE — Progress Notes (Signed)
[facility] provided pt with a list of primary care resources.  °

## 2013-11-02 NOTE — ED Provider Notes (Signed)
Medical screening examination/treatment/procedure(s) were conducted as a shared visit with non-physician practitioner(s) and myself.  I personally evaluated the patient during the encounter.  EKG Interpretation    Date/Time:  Tuesday November 02 2013 08:26:50 EST Ventricular Rate:  72 PR Interval:  199 QRS Duration: 89 QT Interval:  391 QTC Calculation: 428 R Axis:   63 Text Interpretation:  Sinus rhythm Baseline wander in lead(s) V5 V6 No significant change since last tracing Confirmed by POLLINA  MD, CHRISTOPHER ([id number]) on 11/02/2013 8:37:56 AM            Well appearing. PERC negative. Likely musculoskeletal in nature  Lyanne Co, MD 11/02/13 1343

## 2014-01-12 ENCOUNTER — Emergency Department (HOSPITAL_COMMUNITY): Payer: Self-pay

## 2014-01-12 ENCOUNTER — Encounter (HOSPITAL_COMMUNITY): Payer: Self-pay | Admitting: Emergency Medicine

## 2014-01-12 ENCOUNTER — Emergency Department (HOSPITAL_COMMUNITY)
Admission: EM | Admit: 2014-01-12 | Discharge: 2014-01-12 | Disposition: A | Discharge status: Home / Self Care | Payer: Self-pay | Attending: Emergency Medicine | Admitting: Emergency Medicine

## 2014-01-12 DIAGNOSIS — Z79899 Other long term (current) drug therapy: Secondary | ICD-10-CM | POA: Insufficient documentation

## 2014-01-12 DIAGNOSIS — J209 Acute bronchitis, unspecified: Secondary | ICD-10-CM | POA: Insufficient documentation

## 2014-01-12 DIAGNOSIS — J40 Bronchitis, not specified as acute or chronic: Secondary | ICD-10-CM

## 2014-01-12 DIAGNOSIS — F419 Anxiety disorder, unspecified: Secondary | ICD-10-CM

## 2014-01-12 DIAGNOSIS — E669 Obesity, unspecified: Secondary | ICD-10-CM | POA: Insufficient documentation

## 2014-01-12 DIAGNOSIS — F329 Major depressive disorder, single episode, unspecified: Secondary | ICD-10-CM | POA: Insufficient documentation

## 2014-01-12 DIAGNOSIS — IMO0002 Reserved for concepts with insufficient information to code with codable children: Secondary | ICD-10-CM | POA: Insufficient documentation

## 2014-01-12 DIAGNOSIS — Z8742 Personal history of other diseases of the female genital tract: Secondary | ICD-10-CM | POA: Insufficient documentation

## 2014-01-12 DIAGNOSIS — I10 Essential (primary) hypertension: Secondary | ICD-10-CM | POA: Insufficient documentation

## 2014-01-12 DIAGNOSIS — F3289 Other specified depressive episodes: Secondary | ICD-10-CM | POA: Insufficient documentation

## 2014-01-12 DIAGNOSIS — F411 Generalized anxiety disorder: Secondary | ICD-10-CM | POA: Insufficient documentation

## 2014-01-12 DIAGNOSIS — Z87440 Personal history of urinary (tract) infections: Secondary | ICD-10-CM | POA: Insufficient documentation

## 2014-01-12 DIAGNOSIS — Z872 Personal history of diseases of the skin and subcutaneous tissue: Secondary | ICD-10-CM | POA: Insufficient documentation

## 2014-01-12 DIAGNOSIS — F17200 Nicotine dependence, unspecified, uncomplicated: Secondary | ICD-10-CM | POA: Insufficient documentation

## 2014-01-12 DIAGNOSIS — Z8619 Personal history of other infectious and parasitic diseases: Secondary | ICD-10-CM | POA: Insufficient documentation

## 2014-01-12 LAB — BASIC METABOLIC PANEL
BUN: 11 mg/dL (ref 6–23)
CO2: 24 mEq/L (ref 19–32)
CREATININE: 1.04 mg/dL (ref 0.50–1.10)
Calcium: 9.4 mg/dL (ref 8.4–10.5)
Chloride: 101 mEq/L (ref 96–112)
GFR calc Af Amer: 83 mL/min — ABNORMAL LOW (ref >90)
GFR calc non Af Amer: 71 mL/min — ABNORMAL LOW (ref >90)
Glucose, Bld: 114 mg/dL — ABNORMAL HIGH (ref 70–99)
POTASSIUM: 3.7 meq/L (ref 3.7–5.3)
Sodium: 138 mEq/L (ref 137–147)

## 2014-01-12 LAB — POCT I-STAT TROPONIN I: Troponin i, poc: 0 ng/mL (ref 0.00–0.08)

## 2014-01-12 LAB — CBC
HCT: 40.7 % (ref 36.0–46.0)
HEMOGLOBIN: 14.2 g/dL (ref 12.0–15.0)
MCH: 31.1 pg (ref 26.0–34.0)
MCHC: 34.9 g/dL (ref 30.0–36.0)
MCV: 89.3 fL (ref 78.0–100.0)
Platelets: 333 10*3/uL (ref 150–400)
RBC: 4.56 MIL/uL (ref 3.87–5.11)
RDW: 12.5 % (ref 11.5–15.5)
WBC: 10.3 10*3/uL (ref 4.0–10.5)

## 2014-01-12 MED ORDER — LORAZEPAM 1 MG PO TABS
1.0000 mg | ORAL_TABLET | Freq: Once | ORAL | Status: AC
  Administered 2014-01-12: 1 mg via ORAL
  Filled 2014-01-12: qty 1

## 2014-01-12 MED ORDER — AZITHROMYCIN 250 MG PO TABS
500.0000 mg | ORAL_TABLET | Freq: Once | ORAL | Status: AC
  Administered 2014-01-12: 500 mg via ORAL
  Filled 2014-01-12: qty 2

## 2014-01-12 MED ORDER — AZITHROMYCIN 250 MG PO TABS: 250.0000 mg | ORAL_TABLET | Freq: Every day | ORAL | Status: DC

## 2014-01-12 MED ORDER — LORAZEPAM 0.5 MG PO TABS: 0.5000 mg | ORAL_TABLET | Freq: Every day | ORAL | Status: DC | PRN

## 2014-01-12 NOTE — Progress Notes (Signed)
CSW consult to pt for stress and community resources. CSW spoke with pt regarding her life's circumstances and causation of stress. Pt reports that she is having difficulty finding employment and would like to have a permanent place to live. Pt and 2 children (age 939 &7) currently living with her sister in a one bedroom apartment. Pt receives food stamps and child support from ex-husband. 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 Pt feels she she had a panic attack. Pt  does not have a counselor, therapist or psychiatrist in the community. Pt given resource for [facility] [place], [place] this service would be able to assist with mental health service as well as community assistance. Pt states she takes no medication for anxiety. Pt has good family support. Pt was appreciative of information shared. Pt gave CSW permission to speak with sister who confirmed pt.'s circumstance.  Pt to be discharged home with resources. Pt.'s aunt and Cletis Muma friend present at bedside.   [address]Lathyn [place]iggs, [place] [phone]

## 2014-01-12 NOTE — ED Notes (Signed)
Doris SW at bedside.  

## 2014-01-12 NOTE — ED Notes (Signed)
Friend given crackers and gingerale per request.

## 2014-01-12 NOTE — Discharge Instructions (Signed)
Bronchitis Bronchitis is inflammation of the airways that extend from the windpipe into the lungs (bronchi). The inflammation often causes mucus to develop, which leads to a cough. If the inflammation becomes severe, it may cause shortness of breath. CAUSES  Bronchitis may be caused by:   Viral infections.   Bacteria.   Cigarette smoke.   Allergens, pollutants, and other irritants.  SIGNS AND SYMPTOMS  The most common symptom of bronchitis is a frequent cough that produces mucus. Other symptoms include:  Fever.   Body aches.   Chest congestion.   Chills.   Shortness of breath.   Sore throat.  DIAGNOSIS  Bronchitis is usually diagnosed through a medical history and physical exam. Tests, such as chest X-rays, are sometimes done to rule out other conditions.  TREATMENT  You may need to avoid contact with whatever caused the problem (smoking, for example). Medicines are sometimes needed. These may include:  Antibiotics. These may be prescribed if the condition is caused by bacteria.  Cough suppressants. These may be prescribed for relief of cough symptoms.   Inhaled medicines. These may be prescribed to help open your airways and make it easier for you to breathe.   Steroid medicines. These may be prescribed for those with recurrent (chronic) bronchitis. HOME CARE INSTRUCTIONS  Get plenty of rest.   Drink enough fluids to keep your urine clear or pale yellow (unless you have a medical condition that requires fluid restriction). Increasing fluids may help thin your secretions and will prevent dehydration.   Only take over-the-counter or prescription medicines as directed by your health care provider.  Only take antibiotics as directed. Make sure you finish them even if you start to feel better.  Avoid secondhand smoke, irritating chemicals, and strong fumes. These will make bronchitis worse. If you are a smoker, quit smoking. Consider using nicotine gum or  skin patches to help control withdrawal symptoms. Quitting smoking will help your lungs heal faster.   Put a cool-mist humidifier in your bedroom at night to moisten the air. This may help loosen mucus. Change the water in the humidifier daily. You can also run the hot water in your shower and sit in the bathroom with the door closed for 5 10 minutes.   Follow up with your health care provider as directed.   Wash your hands frequently to avoid catching bronchitis again or spreading an infection to others.  SEEK MEDICAL CARE IF: Your symptoms do not improve after 1 week of treatment.  SEEK IMMEDIATE MEDICAL CARE IF:  Your fever increases.  You have chills.   You have chest pain.   You have worsening shortness of breath.   You have bloody sputum.  You faint.  You have lightheadedness.  You have a severe headache.   You vomit repeatedly. MAKE SURE YOU:   Understand these instructions.  Will watch your condition.  Will get help right away if you are not doing well or get worse. Document Released: 11/25/2005 Document Revised: 09/15/2013 Document Reviewed: 07/20/2013 [facility] Patient Information 2014 [place], [place].  Panic Attacks Panic attacks are sudden, short-livedsurges of severe anxiety, fear, or discomfort. They may occur for no reason when you are relaxed, when you are anxious, or when you are sleeping. Panic attacks may occur for a number of reasons:   Healthy people occasionally have panic attacks in extreme, life-threatening situations, such as war or natural disasters. Normal anxiety is a protective mechanism of the body that helps [place] react to danger (fight or  flight response).  Panic attacks are often seen with anxiety disorders, such as panic disorder, social anxiety disorder, generalized anxiety disorder, and phobias. Anxiety disorders cause excessive or uncontrollable anxiety. They may interfere with your relationships or other life activities.  Panic  attacks are sometimes seen with other mental illnesses such as depression and posttraumatic stress disorder.  Certain medical conditions, prescription medicines, and drugs of abuse can cause panic attacks. SYMPTOMS  Panic attacks start suddenly, peak within 20 minutes, and are accompanied by four or more of the following symptoms:  Pounding heart or fast heart rate (palpitations).  Sweating.  Trembling or shaking.  Shortness of breath or feeling smothered.  Feeling choked.  Chest pain or discomfort.  Nausea or strange feeling in your stomach.  Dizziness, lightheadedness, or feeling like you will faint.  Chills or hot flushes.  Numbness or tingling in your lips or hands and feet.  Feeling that things are not real or feeling that you are not yourself.  Fear of losing control or going crazy.  Fear of dying. Some of these symptoms can mimic serious medical conditions. For example, you may think you are having a heart attack. Although panic attacks can be very scary, they are not life threatening. DIAGNOSIS  Panic attacks are diagnosed through an assessment by your health care provider. Your health care provider will ask questions about your symptoms, such as where and when they occurred. Your health care provider will also ask about your medical history and use of alcohol and drugs, including prescription medicines. Your health care provider may order blood tests or other studies to rule out a serious medical condition. Your health care provider may refer you to a mental health professional for further evaluation. TREATMENT   Most healthy people who have one or two panic attacks in an extreme, life-threatening situation will not require treatment.  The treatment for panic attacks associated with anxiety disorders or other mental illness typically involves counseling with a mental health professional, medicine, or a combination of both. Your health care provider will help determine  what treatment is best for you.  Panic attacks due to physical illness usually goes away with treatment of the illness. If prescription medicine is causing panic attacks, talk with your health care provider about stopping the medicine, decreasing the dose, or substituting another medicine.  Panic attacks due to alcohol or drug abuse goes away with abstinence. Some adults need professional help in order to stop drinking or using drugs. HOME CARE INSTRUCTIONS   Take all your medicines as prescribed.   Check with your health care provider before starting new prescription or over-the-counter medicines.  Keep all follow up appointments with your health care provider. SEEK MEDICAL CARE IF:  You are not able to take your medicines as prescribed.  Your symptoms do not improve or get worse. SEEK IMMEDIATE MEDICAL CARE IF:   You experience panic attack symptoms that are different than your usual symptoms.  You have serious thoughts about hurting yourself or others.  You are taking medicine for panic attacks and have a serious side effect. MAKE SURE YOU:  Understand these instructions.  Will watch your condition.  Will get help right away if you are not doing well or get worse. Document Released: 11/25/2005 Document Revised: 09/15/2013 Document Reviewed: 07/09/2013 [facility] Patient Information 2014 [place], [place].

## 2014-01-12 NOTE — ED Notes (Signed)
Spoke to Doris-SW about patient. sts will speak to patient.

## 2014-01-12 NOTE — ED Notes (Signed)
Pt.s sister Horton FinerGina Leah Farmer called this RN requesting patient get psychiatric evaluation due to increased stress with difficult divorce and childcare situation. Sts pt.s stress has caused pt to be break out in hives, increased BP, and panic attacks.  Pt. sts RN can share any medical information with sister. RN updated on plan to medically clear, and then communicate with social worker for additional mental health resources.

## 2014-01-12 NOTE — ED Notes (Signed)
Pt sts she is going to die today. Pt telling friend "I know my body and now that I am right with God I'm going home. Tell my family I love them" Pt vital signs WNL. Breathing equal and unlabored. Pt eyes closed and moving extremities. RN lifted arm up above head, and arm laid down next to patients body. Pt grimacing to painful stimuli with pressure on nails. Pt and family member comforted.
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
## 2014-01-12 NOTE — ED Notes (Signed)
Per EMS pt has been having generalized body aches x 2 weeks, today pt was at [facility] and had worsening body aches, tachypnea and numbness and tingling. Pt was able to be talked down by EMS. Pt sts has been under lots of stress recently.  Pt very anxious on arrival.

## 2014-01-13 NOTE — Progress Notes (Signed)
Pt denies SI/HI/hallucinations.   [address]irstina [place], [place] [phone]

## 2014-01-14 NOTE — ED Provider Notes (Signed)
Medical screening examination/treatment/procedure(s) were performed by non-physician practitioner and as supervising physician I was immediately available for consultation/collaboration.  EKG Interpretation    Date/Time:  Wednesday January 12 2014 11:19:47 EST Ventricular Rate:  90 PR Interval:  147 QRS Duration: 93 QT Interval:  365 QTC Calculation: 447 R Axis:   85 Text Interpretation:  Sinus rhythm Normal ECG When compared with ECG of 11/02/2013 No significant change was found Confirmed by [facility]  MD, Shirlena Brinegar [phone]([id number]) on 01/12/2014 11:48:48 AM              Laray AngerKathleen M Majid Mccravy, DO 01/14/14 1406

## 2014-01-28 ENCOUNTER — Ambulatory Visit: Payer: 59

## 2014-02-11 ENCOUNTER — Encounter: Payer: Self-pay | Admitting: General Practice

## 2014-03-17 ENCOUNTER — Other Ambulatory Visit: Payer: Self-pay | Admitting: Family Medicine

## 2014-05-09 ENCOUNTER — Encounter (HOSPITAL_COMMUNITY): Payer: Self-pay

## 2014-05-09 ENCOUNTER — Inpatient Hospital Stay (HOSPITAL_COMMUNITY)
Admission: AD | Admit: 2014-05-09 | Discharge: 2014-05-09 | Disposition: A | Discharge status: Home / Self Care | Payer: Medicaid Other | Source: Ambulatory Visit | Attending: Obstetrics & Gynecology | Admitting: Obstetrics & Gynecology

## 2014-05-09 DIAGNOSIS — IMO0002 Reserved for concepts with insufficient information to code with codable children: Secondary | ICD-10-CM | POA: Insufficient documentation

## 2014-05-09 DIAGNOSIS — O99330 Smoking (tobacco) complicating pregnancy, unspecified trimester: Secondary | ICD-10-CM | POA: Diagnosis not present

## 2014-05-09 DIAGNOSIS — N760 Acute vaginitis: Secondary | ICD-10-CM | POA: Insufficient documentation

## 2014-05-09 DIAGNOSIS — O99891 Other specified diseases and conditions complicating pregnancy: Secondary | ICD-10-CM | POA: Insufficient documentation

## 2014-05-09 DIAGNOSIS — A499 Bacterial infection, unspecified: Secondary | ICD-10-CM | POA: Insufficient documentation

## 2014-05-09 DIAGNOSIS — B9689 Other specified bacterial agents as the cause of diseases classified elsewhere: Secondary | ICD-10-CM | POA: Diagnosis not present

## 2014-05-09 DIAGNOSIS — L02412 Cutaneous abscess of left axilla: Secondary | ICD-10-CM

## 2014-05-09 DIAGNOSIS — Z3490 Encounter for supervision of normal pregnancy, unspecified, unspecified trimester: Secondary | ICD-10-CM

## 2014-05-09 DIAGNOSIS — R079 Chest pain, unspecified: Secondary | ICD-10-CM

## 2014-05-09 DIAGNOSIS — O9989 Other specified diseases and conditions complicating pregnancy, childbirth and the puerperium: Principal | ICD-10-CM

## 2014-05-09 LAB — URINALYSIS, ROUTINE W REFLEX MICROSCOPIC
BILIRUBIN URINE: NEGATIVE
Glucose, UA: NEGATIVE mg/dL
HGB URINE DIPSTICK: NEGATIVE
Ketones, ur: 15 mg/dL — AB
Leukocytes, UA: NEGATIVE
Nitrite: NEGATIVE
PROTEIN: NEGATIVE mg/dL
Specific Gravity, Urine: 1.02 (ref 1.005–1.030)
Urobilinogen, UA: 0.2 mg/dL (ref 0.0–1.0)
pH: 6.5 (ref 5.0–8.0)

## 2014-05-09 LAB — RAPID URINE DRUG SCREEN, HOSP PERFORMED
Amphetamines: NOT DETECTED
Barbiturates: NOT DETECTED
Benzodiazepines: NOT DETECTED
Cocaine: NOT DETECTED
OPIATES: NOT DETECTED
Tetrahydrocannabinol: POSITIVE — AB

## 2014-05-09 LAB — WET PREP, GENITAL
Trich, Wet Prep: NONE SEEN
Yeast Wet Prep HPF POC: NONE SEEN

## 2014-05-09 LAB — POCT PREGNANCY, URINE: PREG TEST UR: POSITIVE — AB

## 2014-05-09 LAB — HIV ANTIBODY (ROUTINE TESTING W REFLEX): HIV: NONREACTIVE

## 2014-05-09 MED ORDER — METRONIDAZOLE 500 MG PO TABS
500.0000 mg | ORAL_TABLET | Freq: Two times a day (BID) | ORAL | Status: DC
Start: 2014-05-09 — End: 2014-07-12

## 2014-05-09 MED ORDER — CEPHALEXIN 500 MG PO CAPS: 500.0000 mg | ORAL_CAPSULE | Freq: Four times a day (QID) | ORAL | Status: DC

## 2014-05-09 MED ORDER — FLUCONAZOLE 150 MG PO TABS: 150.0000 mg | ORAL_TABLET | Freq: Every day | ORAL | Status: DC

## 2014-05-09 NOTE — MAU Note (Signed)
Patient states she has had a positive home pregnancy test . States she has been having epigastric/chest for a while after eating then lying down. No pain this am. Denies bleeding but has a slight discharge and wants STI testing. Denies nausea or vomiting.

## 2014-05-09 NOTE — MAU Note (Signed)
Pt not having chest pain. Points to epigastric area. Intermittent burning.

## 2014-05-09 NOTE — Discharge Instructions (Signed)
Bacterial Vaginosis Bacterial vaginosis is a vaginal infection that occurs when the normal balance of bacteria in the vagina is disrupted. It results from an overgrowth of certain bacteria. This is the most common vaginal infection in women of childbearing age. Treatment is important to prevent complications, especially in pregnant women, as it can cause a premature delivery. CAUSES  Bacterial vaginosis is caused by an increase in harmful bacteria that are normally present in smaller amounts in the vagina. Several different kinds of bacteria can cause bacterial vaginosis. However, the reason that the condition develops is not fully understood. RISK FACTORS Certain activities or behaviors can put you at an increased risk of developing bacterial vaginosis, including:  Having a new sex partner or multiple sex partners.  Douching.  Using an intrauterine device (IUD) for contraception. Women do not get bacterial vaginosis from toilet seats, bedding, swimming pools, or contact with objects around them. SIGNS AND SYMPTOMS  Some women with bacterial vaginosis have no signs or symptoms. Common symptoms include:  Grey vaginal discharge.  A fishlike odor with discharge, especially after sexual intercourse.  Itching or burning of the vagina and vulva.  Burning or pain with urination. DIAGNOSIS  Your health care provider will take a medical history and examine the vagina for signs of bacterial vaginosis. A sample of vaginal fluid may be taken. Your health care provider will look at this sample under a microscope to check for bacteria and abnormal cells. A vaginal pH test may also be done.  TREATMENT  Bacterial vaginosis may be treated with antibiotic medicines. These may be given in the form of a pill or a vaginal cream. A second round of antibiotics may be prescribed if the condition comes back after treatment.  HOME CARE INSTRUCTIONS   Only take over-the-counter or prescription medicines as  directed by your health care provider.  If antibiotic medicine was prescribed, take it as directed. Make sure you finish it even if you start to feel better.  Do not have sex until treatment is completed.  Tell all sexual partners that you have a vaginal infection. They should see their health care provider and be treated if they have problems, such as a mild rash or itching.  Practice safe sex by using condoms and only having one sex partner. SEEK MEDICAL CARE IF:   Your symptoms are not improving after 3 days of treatment.  You have increased discharge or pain.  You have a fever. MAKE SURE YOU:   Understand these instructions.  Will watch your condition.  Will get help right away if you are not doing well or get worse. FOR MORE INFORMATION  Centers for Disease Control and Prevention, Division of STD Prevention: [employer] American Sexual Health Association (ASHA): www.ashastd.org  Document Released: 11/25/2005 Document Revised: 09/15/2013 Document Reviewed: 07/07/2013 [facility] Patient Information 2014 [place], [place].  Abscess An abscess is an infected area that contains a collection of pus and debris.It can occur in almost any part of the body. An abscess is also known as a furuncle or boil. CAUSES  An abscess occurs when tissue gets infected. This can occur from blockage of oil or sweat glands, infection of hair follicles, or a minor injury to the skin. As the body tries to fight the infection, pus collects in the area and creates pressure under the skin. This pressure causes pain. People with weakened immune systems have difficulty fighting infections and get certain abscesses more often.  SYMPTOMS Usually an abscess develops on the skin and becomes  a painful mass that is red, warm, and tender. If the abscess forms under the skin, you may feel a moveable soft area under the skin. Some abscesses break open (rupture) on their own, but most will continue to get worse without  care. The infection can spread deeper into the body and eventually into the bloodstream, causing you to feel ill.  DIAGNOSIS  Your caregiver will take your medical history and perform a physical exam. A sample of fluid may also be taken from the abscess to determine what is causing your infection. TREATMENT  Your caregiver may prescribe antibiotic medicines to fight the infection. However, taking antibiotics alone usually does not cure an abscess. Your caregiver may need to make a small cut (incision) in the abscess to drain the pus. In some cases, gauze is packed into the abscess to reduce pain and to continue draining the area. HOME CARE INSTRUCTIONS   Only take over-the-counter or prescription medicines for pain, discomfort, or fever as directed by your caregiver.  If you were prescribed antibiotics, take them as directed. Finish them even if you start to feel better.  If gauze is used, follow your caregiver's directions for changing the gauze.  To avoid spreading the infection:  Keep your draining abscess covered with a bandage.  Wash your hands well.  Do not share personal care items, towels, or whirlpools with others.  Avoid skin contact with others.  Keep your skin and clothes clean around the abscess.  Keep all follow-up appointments as directed by your caregiver. SEEK MEDICAL CARE IF:   You have increased pain, swelling, redness, fluid drainage, or bleeding.  You have muscle aches, chills, or a general ill feeling.  You have a fever. MAKE SURE YOU:   Understand these instructions.  Will watch your condition.  Will get help right away if you are not doing well or get worse. Document Released: 09/04/2005 Document Revised: 05/26/2012 Document Reviewed: 02/07/2012 [facility] Patient Information 2014 [place], [place].
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
## 2014-05-10 LAB — GC/CHLAMYDIA PROBE AMP
CT PROBE, AMP APTIMA: NEGATIVE
GC Probe RNA: NEGATIVE

## 2014-07-12 ENCOUNTER — Emergency Department (HOSPITAL_COMMUNITY): Payer: Medicaid Other

## 2014-07-12 ENCOUNTER — Emergency Department (HOSPITAL_COMMUNITY)
Admission: EM | Admit: 2014-07-12 | Discharge: 2014-07-12 | Disposition: A | Discharge status: Home / Self Care | Payer: Medicaid Other | Attending: Emergency Medicine | Admitting: Emergency Medicine

## 2014-07-12 ENCOUNTER — Encounter (HOSPITAL_COMMUNITY): Payer: Self-pay | Admitting: Emergency Medicine

## 2014-07-12 DIAGNOSIS — F419 Anxiety disorder, unspecified: Secondary | ICD-10-CM

## 2014-07-12 DIAGNOSIS — E669 Obesity, unspecified: Secondary | ICD-10-CM | POA: Diagnosis not present

## 2014-07-12 DIAGNOSIS — Z87440 Personal history of urinary (tract) infections: Secondary | ICD-10-CM | POA: Insufficient documentation

## 2014-07-12 DIAGNOSIS — F17200 Nicotine dependence, unspecified, uncomplicated: Secondary | ICD-10-CM | POA: Diagnosis not present

## 2014-07-12 DIAGNOSIS — I10 Essential (primary) hypertension: Secondary | ICD-10-CM | POA: Diagnosis not present

## 2014-07-12 DIAGNOSIS — R0789 Other chest pain: Secondary | ICD-10-CM | POA: Diagnosis not present

## 2014-07-12 DIAGNOSIS — Z79899 Other long term (current) drug therapy: Secondary | ICD-10-CM | POA: Diagnosis not present

## 2014-07-12 DIAGNOSIS — Z8619 Personal history of other infectious and parasitic diseases: Secondary | ICD-10-CM | POA: Diagnosis not present

## 2014-07-12 DIAGNOSIS — F411 Generalized anxiety disorder: Secondary | ICD-10-CM | POA: Diagnosis not present

## 2014-07-12 DIAGNOSIS — Z872 Personal history of diseases of the skin and subcutaneous tissue: Secondary | ICD-10-CM | POA: Diagnosis not present

## 2014-07-12 DIAGNOSIS — R079 Chest pain, unspecified: Secondary | ICD-10-CM | POA: Insufficient documentation

## 2014-07-12 DIAGNOSIS — Z8742 Personal history of other diseases of the female genital tract: Secondary | ICD-10-CM | POA: Diagnosis not present

## 2014-07-12 LAB — BASIC METABOLIC PANEL
ANION GAP: 12 (ref 5–15)
BUN: 13 mg/dL (ref 6–23)
CALCIUM: 9.4 mg/dL (ref 8.4–10.5)
CO2: 26 mEq/L (ref 19–32)
Chloride: 101 mEq/L (ref 96–112)
Creatinine, Ser: 1 mg/dL (ref 0.50–1.10)
GFR calc non Af Amer: 75 mL/min — ABNORMAL LOW (ref >90)
GFR, EST AFRICAN AMERICAN: 87 mL/min — AB (ref >90)
Glucose, Bld: 104 mg/dL — ABNORMAL HIGH (ref 70–99)
Potassium: 4.1 mEq/L (ref 3.7–5.3)
Sodium: 139 mEq/L (ref 137–147)

## 2014-07-12 LAB — CBC
HCT: 40.8 % (ref 36.0–46.0)
Hemoglobin: 13.5 g/dL (ref 12.0–15.0)
MCH: 30.5 pg (ref 26.0–34.0)
MCHC: 33.1 g/dL (ref 30.0–36.0)
MCV: 92.1 fL (ref 78.0–100.0)
PLATELETS: 359 10*3/uL (ref 150–400)
RBC: 4.43 MIL/uL (ref 3.87–5.11)
RDW: 12.9 % (ref 11.5–15.5)
WBC: 8.8 10*3/uL (ref 4.0–10.5)

## 2014-07-12 LAB — I-STAT TROPONIN, ED: Troponin i, poc: 0 ng/mL (ref 0.00–0.08)

## 2014-07-12 LAB — PRO B NATRIURETIC PEPTIDE: PRO B NATRI PEPTIDE: 52.2 pg/mL (ref 0–125)

## 2014-07-12 MED ORDER — LORAZEPAM 1 MG PO TABS: 1.0000 mg | ORAL_TABLET | Freq: Three times a day (TID) | ORAL | Status: DC | PRN

## 2014-07-12 MED ORDER — NAPROXEN 500 MG PO TABS
500.0000 mg | ORAL_TABLET | Freq: Once | ORAL | Status: AC
  Administered 2014-07-12: 500 mg via ORAL
  Filled 2014-07-12: qty 1

## 2014-07-12 MED ORDER — LORAZEPAM 1 MG PO TABS
2.0000 mg | ORAL_TABLET | Freq: Once | ORAL | Status: AC
  Administered 2014-07-12: 2 mg via ORAL
  Filled 2014-07-12: qty 2

## 2014-07-12 MED ORDER — NAPROXEN 500 MG PO TABS
500.0000 mg | ORAL_TABLET | Freq: Two times a day (BID) | ORAL | Status: DC
Start: 2014-07-12 — End: 2014-11-24

## 2014-07-12 NOTE — ED Notes (Signed)
Pt c/o left sided chest pain, that started yesterday. C/o SOB as well but denies n/v.
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
## 2014-07-12 NOTE — Discharge Instructions (Signed)

## 2014-07-26 ENCOUNTER — Encounter (HOSPITAL_BASED_OUTPATIENT_CLINIC_OR_DEPARTMENT_OTHER): Payer: Self-pay | Admitting: Emergency Medicine

## 2014-07-26 ENCOUNTER — Emergency Department (HOSPITAL_BASED_OUTPATIENT_CLINIC_OR_DEPARTMENT_OTHER)
Admission: EM | Admit: 2014-07-26 | Discharge: 2014-07-26 | Disposition: A | Discharge status: Home / Self Care | Payer: Medicaid Other | Attending: Emergency Medicine | Admitting: Emergency Medicine

## 2014-07-26 DIAGNOSIS — F411 Generalized anxiety disorder: Secondary | ICD-10-CM | POA: Diagnosis not present

## 2014-07-26 DIAGNOSIS — Z791 Long term (current) use of non-steroidal anti-inflammatories (NSAID): Secondary | ICD-10-CM | POA: Diagnosis not present

## 2014-07-26 DIAGNOSIS — R079 Chest pain, unspecified: Secondary | ICD-10-CM | POA: Diagnosis present

## 2014-07-26 DIAGNOSIS — Z872 Personal history of diseases of the skin and subcutaneous tissue: Secondary | ICD-10-CM | POA: Diagnosis not present

## 2014-07-26 DIAGNOSIS — F419 Anxiety disorder, unspecified: Secondary | ICD-10-CM

## 2014-07-26 DIAGNOSIS — R0789 Other chest pain: Secondary | ICD-10-CM | POA: Insufficient documentation

## 2014-07-26 DIAGNOSIS — F17200 Nicotine dependence, unspecified, uncomplicated: Secondary | ICD-10-CM | POA: Diagnosis not present

## 2014-07-26 DIAGNOSIS — E669 Obesity, unspecified: Secondary | ICD-10-CM | POA: Diagnosis not present

## 2014-07-26 DIAGNOSIS — I10 Essential (primary) hypertension: Secondary | ICD-10-CM | POA: Diagnosis not present

## 2014-07-26 DIAGNOSIS — Z8619 Personal history of other infectious and parasitic diseases: Secondary | ICD-10-CM | POA: Insufficient documentation

## 2014-07-26 DIAGNOSIS — Z87448 Personal history of other diseases of urinary system: Secondary | ICD-10-CM | POA: Diagnosis not present

## 2014-07-26 DIAGNOSIS — Z87440 Personal history of urinary (tract) infections: Secondary | ICD-10-CM | POA: Insufficient documentation

## 2014-07-26 DIAGNOSIS — Z79899 Other long term (current) drug therapy: Secondary | ICD-10-CM | POA: Diagnosis not present

## 2014-07-26 MED ORDER — LORAZEPAM 1 MG PO TABS
0.5000 mg | ORAL_TABLET | Freq: Once | ORAL | Status: AC
  Administered 2014-07-26: 0.5 mg via ORAL
  Filled 2014-07-26: qty 1

## 2014-07-26 MED ORDER — ALPRAZOLAM 0.5 MG PO TABS: 0.5000 mg | ORAL_TABLET | Freq: Two times a day (BID) | ORAL | Status: DC | PRN

## 2014-07-26 NOTE — Discharge Instructions (Signed)
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
## 2014-07-26 NOTE — ED Notes (Signed)
Pt says "I think I am having an anxiety attack, I ran out of my anxiety pills 3 days ago". Pt says that her head, chest and body "feel tight" and feels weak.

## 2014-09-30 ENCOUNTER — Ambulatory Visit (INDEPENDENT_AMBULATORY_CARE_PROVIDER_SITE_OTHER): Payer: Medicaid Other | Admitting: Family Medicine

## 2014-09-30 ENCOUNTER — Other Ambulatory Visit (HOSPITAL_COMMUNITY)
Admission: RE | Admit: 2014-09-30 | Discharge: 2014-09-30 | Disposition: A | Discharge status: Home / Self Care | Payer: Medicaid Other | Source: Ambulatory Visit | Attending: Family Medicine | Admitting: Family Medicine

## 2014-09-30 VITALS — BP 131/87 | HR 80 | Temp 98.0°F | Ht 64.0 in | Wt 187.4 lb

## 2014-09-30 DIAGNOSIS — Z331 Pregnant state, incidental: Secondary | ICD-10-CM

## 2014-09-30 DIAGNOSIS — F411 Generalized anxiety disorder: Secondary | ICD-10-CM

## 2014-09-30 DIAGNOSIS — O0990 Supervision of high risk pregnancy, unspecified, unspecified trimester: Secondary | ICD-10-CM | POA: Insufficient documentation

## 2014-09-30 DIAGNOSIS — I10 Essential (primary) hypertension: Secondary | ICD-10-CM

## 2014-09-30 DIAGNOSIS — N898 Other specified noninflammatory disorders of vagina: Secondary | ICD-10-CM

## 2014-09-30 DIAGNOSIS — N760 Acute vaginitis: Secondary | ICD-10-CM | POA: Insufficient documentation

## 2014-09-30 DIAGNOSIS — Z202 Contact with and (suspected) exposure to infections with a predominantly sexual mode of transmission: Secondary | ICD-10-CM

## 2014-09-30 DIAGNOSIS — Z3490 Encounter for supervision of normal pregnancy, unspecified, unspecified trimester: Secondary | ICD-10-CM

## 2014-09-30 LAB — POCT WET PREP (WET MOUNT): Clue Cells Wet Prep Whiff POC: POSITIVE

## 2014-09-30 LAB — POCT URINE PREGNANCY: Preg Test, Ur: POSITIVE

## 2014-09-30 MED ORDER — ESCITALOPRAM OXALATE 10 MG PO TABS: 10.0000 mg | ORAL_TABLET | Freq: Every day | ORAL | Status: DC

## 2014-09-30 MED ORDER — HYDROCHLOROTHIAZIDE 25 MG PO TABS: 25.0000 mg | ORAL_TABLET | Freq: Every day | ORAL | Status: DC

## 2014-09-30 MED ORDER — TRAMADOL HCL 50 MG PO TABS: 50.0000 mg | ORAL_TABLET | Freq: Three times a day (TID) | ORAL | Status: DC | PRN

## 2014-09-30 NOTE — Assessment & Plan Note (Signed)
Does not plan on keeping baby. Concerned about this -- will contact [facility].   Tearful but has support at home.
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
## 2014-09-30 NOTE — Patient Instructions (Signed)
Take the Lexapro for mood.  Start this when you pick it up.  Refill for HCTZ today.   Take the Tramadol for pain relief for your foot.  You can take 1-2 pills if you need it.  Start with 1.    I will refer you to a podiatrist.    Come back and see me in 2-3 weeks so I know you're doing okay

## 2014-09-30 NOTE — Assessment & Plan Note (Signed)
Refill of HCTZ Unsure if she needs this longterm due to controlled BP's today However cannot tell for sure as she's been taking her mother's medicine. Will FU in 2-3 weeks. Discussed orthostatic/low BP symptoms.

## 2014-09-30 NOTE — Assessment & Plan Note (Signed)
Denied DC -- but had + BV/yeast today.   Didn't want to deal with this after learning about her + preg. Will ask again next visit.

## 2014-09-30 NOTE — Assessment & Plan Note (Signed)
Not helped by + preg test Restart Lexapro. Safety contract.   FU in 2-3 weeks, to call if having trouble with worsening mood/changes/SI

## 2014-10-03 LAB — CERVICOVAGINAL ANCILLARY ONLY
CHLAMYDIA, DNA PROBE: NEGATIVE
Neisseria Gonorrhea: NEGATIVE

## 2014-10-06 ENCOUNTER — Encounter: Payer: Self-pay | Admitting: Family Medicine

## 2014-10-10 ENCOUNTER — Encounter (HOSPITAL_BASED_OUTPATIENT_CLINIC_OR_DEPARTMENT_OTHER): Payer: Self-pay | Admitting: Emergency Medicine

## 2014-10-28 ENCOUNTER — Encounter (HOSPITAL_COMMUNITY): Payer: Self-pay | Admitting: Emergency Medicine

## 2014-10-28 ENCOUNTER — Emergency Department (HOSPITAL_COMMUNITY)
Admission: EM | Admit: 2014-10-28 | Discharge: 2014-10-28 | Disposition: A | Discharge status: Home / Self Care | Payer: Medicaid Other | Attending: Emergency Medicine | Admitting: Emergency Medicine

## 2014-10-28 ENCOUNTER — Telehealth: Payer: Self-pay | Admitting: *Deleted

## 2014-10-28 DIAGNOSIS — Z720 Tobacco use: Secondary | ICD-10-CM | POA: Diagnosis not present

## 2014-10-28 DIAGNOSIS — Z79899 Other long term (current) drug therapy: Secondary | ICD-10-CM | POA: Insufficient documentation

## 2014-10-28 DIAGNOSIS — Z791 Long term (current) use of non-steroidal anti-inflammatories (NSAID): Secondary | ICD-10-CM | POA: Diagnosis not present

## 2014-10-28 DIAGNOSIS — I10 Essential (primary) hypertension: Secondary | ICD-10-CM | POA: Diagnosis not present

## 2014-10-28 DIAGNOSIS — F419 Anxiety disorder, unspecified: Secondary | ICD-10-CM | POA: Insufficient documentation

## 2014-10-28 DIAGNOSIS — F410 Panic disorder [episodic paroxysmal anxiety] without agoraphobia: Secondary | ICD-10-CM

## 2014-10-28 DIAGNOSIS — E669 Obesity, unspecified: Secondary | ICD-10-CM | POA: Diagnosis not present

## 2014-10-28 DIAGNOSIS — Z872 Personal history of diseases of the skin and subcutaneous tissue: Secondary | ICD-10-CM | POA: Diagnosis not present

## 2014-10-28 DIAGNOSIS — F329 Major depressive disorder, single episode, unspecified: Secondary | ICD-10-CM | POA: Diagnosis not present

## 2014-10-28 DIAGNOSIS — R112 Nausea with vomiting, unspecified: Secondary | ICD-10-CM | POA: Insufficient documentation

## 2014-10-28 DIAGNOSIS — Z8619 Personal history of other infectious and parasitic diseases: Secondary | ICD-10-CM | POA: Diagnosis not present

## 2014-10-28 DIAGNOSIS — M549 Dorsalgia, unspecified: Secondary | ICD-10-CM | POA: Diagnosis not present

## 2014-10-28 DIAGNOSIS — Z87440 Personal history of urinary (tract) infections: Secondary | ICD-10-CM | POA: Diagnosis not present

## 2014-10-28 DIAGNOSIS — Z87448 Personal history of other diseases of urinary system: Secondary | ICD-10-CM | POA: Insufficient documentation

## 2014-10-28 DIAGNOSIS — R079 Chest pain, unspecified: Secondary | ICD-10-CM | POA: Diagnosis present

## 2014-10-28 MED ORDER — ACETAMINOPHEN 325 MG PO TABS
650.0000 mg | ORAL_TABLET | Freq: Once | ORAL | Status: AC
  Administered 2014-10-28: 650 mg via ORAL
  Filled 2014-10-28: qty 2

## 2014-10-28 MED ORDER — DIPHENHYDRAMINE HCL 25 MG PO CAPS
25.0000 mg | ORAL_CAPSULE | Freq: Once | ORAL | Status: AC
  Administered 2014-10-28: 25 mg via ORAL
  Filled 2014-10-28: qty 1

## 2014-10-28 NOTE — ED Notes (Signed)
Patient given discharge instructions but states "I don't need you to read them to me. F--- this sh--." Discharge instructions in hand. No other complaints/concerns. Ambulatory with steady gait. A&Ox4. RR even-unlabored. In NAD.

## 2014-10-28 NOTE — Telephone Encounter (Signed)
Patient called tearful requesting to speak with PCP, patient informed that MD was in clinic. Tried to get more information but patient only stated 'I just need to speak with Dr. Gwendolyn GrantWalden its about my mood'. Upon review of patient chart it appears that [facility] for panic attack, will forward to PCP.

## 2014-10-28 NOTE — Discharge Instructions (Signed)
Please read and follow all provided instructions.  Your diagnoses today include:  1. Anxiety attack     Tests performed today include:  Vital signs. See below for your results today.   Medications prescribed:   None  Home care instructions:  Follow any educational materials contained in this packet.  Follow-up instructions: Please follow-up with your primary care provider as needed for further evaluation of your symptoms.  Return instructions:   Please return to the Emergency Department if you experience worsening symptoms.   Please return if you have any other emergent concerns.  Additional Information:  Your vital signs today were: BP 138/96 mmHg   Pulse 98   Temp(Src) 98.8 F (37.1 C)   Resp 16   SpO2 100%   LMP 08/10/2014 If your blood pressure (BP) was elevated above 135/85 this visit, please have this repeated by your doctor within one month. ---------------

## 2014-10-28 NOTE — ED Notes (Signed)
Pt reports chest pain since 0700 this am with sob, weakness and vomiting. Hx of HTN, has not taken medication today. Also complains of HA and eye pain. Pt pregnant, estimates 2 months along.
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
## 2014-10-31 NOTE — Telephone Encounter (Signed)
**  Late Note:  Called Ms Lisa Crosby after work on Friday.  Had to leave message, telling her to return call or call ED line this weekend if she's having any concerns.  Will wait to hear back from her.  Mentioned I had seen she had been in the ED.

## 2014-11-08 ENCOUNTER — Other Ambulatory Visit: Payer: Self-pay | Admitting: Family Medicine

## 2014-11-08 NOTE — Telephone Encounter (Signed)
Pt called and would like a prescription for Lexapro. She never picked up the one that was written on 10/23. jw

## 2014-11-09 MED ORDER — ESCITALOPRAM OXALATE 10 MG PO TABS: 10.0000 mg | ORAL_TABLET | Freq: Every day | ORAL | Status: DC

## 2014-11-15 ENCOUNTER — Other Ambulatory Visit: Payer: Medicaid Other

## 2014-11-24 ENCOUNTER — Other Ambulatory Visit (HOSPITAL_COMMUNITY)
Admission: RE | Admit: 2014-11-24 | Discharge: 2014-11-24 | Disposition: A | Discharge status: Home / Self Care | Payer: Medicaid Other | Source: Ambulatory Visit | Attending: Family Medicine | Admitting: Family Medicine

## 2014-11-24 ENCOUNTER — Encounter: Payer: Self-pay | Admitting: Family Medicine

## 2014-11-24 ENCOUNTER — Ambulatory Visit (INDEPENDENT_AMBULATORY_CARE_PROVIDER_SITE_OTHER): Payer: Medicaid Other | Admitting: Family Medicine

## 2014-11-24 VITALS — BP 111/74 | HR 76 | Temp 98.9°F | Wt 192.1 lb

## 2014-11-24 DIAGNOSIS — Z331 Pregnant state, incidental: Secondary | ICD-10-CM

## 2014-11-24 DIAGNOSIS — Z113 Encounter for screening for infections with a predominantly sexual mode of transmission: Secondary | ICD-10-CM | POA: Diagnosis not present

## 2014-11-24 DIAGNOSIS — Z3490 Encounter for supervision of normal pregnancy, unspecified, unspecified trimester: Secondary | ICD-10-CM

## 2014-11-24 DIAGNOSIS — F17200 Nicotine dependence, unspecified, uncomplicated: Secondary | ICD-10-CM

## 2014-11-24 DIAGNOSIS — Z720 Tobacco use: Secondary | ICD-10-CM

## 2014-11-24 MED ORDER — PRENATAL VITAMIN 27-0.8 MG PO TABS: 1.0000 | ORAL_TABLET | Freq: Every day | ORAL | Status: DC

## 2014-11-24 MED ORDER — NICOTINE 21 MG/24HR TD PT24: 21.0000 mg | MEDICATED_PATCH | Freq: Every day | TRANSDERMAL | Status: DC

## 2014-11-24 NOTE — Addendum Note (Signed)
Addended by: Hazeline JunkerGRUNZ, RYAN B on: 11/24/2014 11:57 AM   Modules accepted: Orders

## 2014-11-24 NOTE — Patient Instructions (Signed)
We are getting your OB labs done today and [facility].  Start taking the prenatal vitamin and keep taking lexapro. Start placing the nicotine patch once per day and try to cut down on the cigarettes as much as possible.  - Follow up with me in 4 weeks.   Thank you,  - Dr. Jarvis NewcomerGrunz
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
## 2014-11-25 LAB — OBSTETRIC PANEL
Antibody Screen: NEGATIVE
Basophils Absolute: 0 10*3/uL (ref 0.0–0.1)
Basophils Relative: 0 % (ref 0–1)
EOS PCT: 2 % (ref 0–5)
Eosinophils Absolute: 0.2 10*3/uL (ref 0.0–0.7)
HEMATOCRIT: 36.6 % (ref 36.0–46.0)
HEMOGLOBIN: 12.3 g/dL (ref 12.0–15.0)
Hepatitis B Surface Ag: NEGATIVE
LYMPHS ABS: 2.9 10*3/uL (ref 0.7–4.0)
Lymphocytes Relative: 25 % (ref 12–46)
MCH: 30 pg (ref 26.0–34.0)
MCHC: 33.6 g/dL (ref 30.0–36.0)
MCV: 89.3 fL (ref 78.0–100.0)
MONOS PCT: 8 % (ref 3–12)
MPV: 9 fL — AB (ref 9.4–12.4)
Monocytes Absolute: 0.9 10*3/uL (ref 0.1–1.0)
Neutro Abs: 7.4 10*3/uL (ref 1.7–7.7)
Neutrophils Relative %: 65 % (ref 43–77)
Platelets: 378 10*3/uL (ref 150–400)
RBC: 4.1 MIL/uL (ref 3.87–5.11)
RDW: 13.2 % (ref 11.5–15.5)
RH TYPE: POSITIVE
Rubella: 5.71 Index — ABNORMAL HIGH (ref <0.90)
WBC: 11.4 10*3/uL — ABNORMAL HIGH (ref 4.0–10.5)

## 2014-11-25 LAB — CULTURE, OB URINE
COLONY COUNT: NO GROWTH
Organism ID, Bacteria: NO GROWTH

## 2014-11-25 LAB — HIV ANTIBODY (ROUTINE TESTING W REFLEX): HIV: NONREACTIVE

## 2014-11-25 LAB — URINE CYTOLOGY ANCILLARY ONLY
Chlamydia: NEGATIVE
NEISSERIA GONORRHEA: NEGATIVE

## 2014-11-25 LAB — SICKLE CELL SCREEN: Sickle Cell Screen: NEGATIVE

## 2014-11-28 ENCOUNTER — Ambulatory Visit (HOSPITAL_COMMUNITY): Admission: RE | Admit: 2014-11-28 | Payer: Medicaid Other | Source: Ambulatory Visit

## 2014-11-28 NOTE — Telephone Encounter (Signed)
LVM for patient to call back to inform her of Rx called in.

## 2014-11-30 ENCOUNTER — Ambulatory Visit: Payer: Medicaid Other | Admitting: Family Medicine

## 2014-11-30 ENCOUNTER — Telehealth: Payer: Self-pay | Admitting: Family Medicine

## 2014-11-30 NOTE — Telephone Encounter (Signed)
Spoke with patient and informed her that she would need to be seen before any medication can be prescribed. i am mailing her out a Medicaid dental list for her to review.

## 2014-11-30 NOTE — Telephone Encounter (Signed)
Spoke with pt to inform her about a previous call to let her know that her prescription had been sent in from a previous open encounter.  Pt stated that she has an abscess on her gums and made an appointment for this afternoon.   Pt also inquired about results from some tests ran. Lisa SakaiZimmerman Crosby, [name]

## 2014-11-30 NOTE — Telephone Encounter (Signed)
Pt missed her appointment today at 3:15 and wanted to know what she can do for her abscess tooth. jw

## 2014-11-30 NOTE — Telephone Encounter (Signed)
Pt called to see which one the nurses called her. I didn't see any notes and she said it was a few days ago. jw

## 2014-12-02 ENCOUNTER — Inpatient Hospital Stay (HOSPITAL_COMMUNITY): Payer: Medicaid Other

## 2014-12-02 ENCOUNTER — Encounter (HOSPITAL_COMMUNITY): Payer: Self-pay | Admitting: *Deleted

## 2014-12-02 ENCOUNTER — Inpatient Hospital Stay (HOSPITAL_COMMUNITY)
Admission: AD | Admit: 2014-12-02 | Discharge: 2014-12-02 | Disposition: A | Discharge status: Home / Self Care | Payer: Medicaid Other | Source: Ambulatory Visit | Attending: Obstetrics & Gynecology | Admitting: Obstetrics & Gynecology

## 2014-12-02 DIAGNOSIS — O26899 Other specified pregnancy related conditions, unspecified trimester: Secondary | ICD-10-CM

## 2014-12-02 DIAGNOSIS — O9989 Other specified diseases and conditions complicating pregnancy, childbirth and the puerperium: Secondary | ICD-10-CM | POA: Diagnosis not present

## 2014-12-02 DIAGNOSIS — Z87891 Personal history of nicotine dependence: Secondary | ICD-10-CM | POA: Insufficient documentation

## 2014-12-02 DIAGNOSIS — R109 Unspecified abdominal pain: Secondary | ICD-10-CM | POA: Insufficient documentation

## 2014-12-02 DIAGNOSIS — K047 Periapical abscess without sinus: Secondary | ICD-10-CM | POA: Diagnosis not present

## 2014-12-02 DIAGNOSIS — Z3A15 15 weeks gestation of pregnancy: Secondary | ICD-10-CM | POA: Insufficient documentation

## 2014-12-02 DIAGNOSIS — R1031 Right lower quadrant pain: Secondary | ICD-10-CM | POA: Insufficient documentation

## 2014-12-02 DIAGNOSIS — K088 Other specified disorders of teeth and supporting structures: Secondary | ICD-10-CM | POA: Diagnosis present

## 2014-12-02 LAB — CBC WITH DIFFERENTIAL/PLATELET
Basophils Absolute: 0 10*3/uL (ref 0.0–0.1)
Basophils Relative: 0 % (ref 0–1)
Eosinophils Absolute: 0.4 10*3/uL (ref 0.0–0.7)
Eosinophils Relative: 3 % (ref 0–5)
HEMATOCRIT: 34.2 % — AB (ref 36.0–46.0)
Hemoglobin: 11.8 g/dL — ABNORMAL LOW (ref 12.0–15.0)
LYMPHS PCT: 25 % (ref 12–46)
Lymphs Abs: 3.5 10*3/uL (ref 0.7–4.0)
MCH: 30.6 pg (ref 26.0–34.0)
MCHC: 34.5 g/dL (ref 30.0–36.0)
MCV: 88.8 fL (ref 78.0–100.0)
MONO ABS: 1 10*3/uL (ref 0.1–1.0)
Monocytes Relative: 7 % (ref 3–12)
Neutro Abs: 9.1 10*3/uL — ABNORMAL HIGH (ref 1.7–7.7)
Neutrophils Relative %: 65 % (ref 43–77)
Platelets: 307 10*3/uL (ref 150–400)
RBC: 3.85 MIL/uL — ABNORMAL LOW (ref 3.87–5.11)
RDW: 12.6 % (ref 11.5–15.5)
WBC: 13.9 10*3/uL — AB (ref 4.0–10.5)

## 2014-12-02 LAB — URINALYSIS, ROUTINE W REFLEX MICROSCOPIC
Bilirubin Urine: NEGATIVE
GLUCOSE, UA: NEGATIVE mg/dL
HGB URINE DIPSTICK: NEGATIVE
Ketones, ur: NEGATIVE mg/dL
Leukocytes, UA: NEGATIVE
Nitrite: NEGATIVE
PROTEIN: NEGATIVE mg/dL
Specific Gravity, Urine: 1.02 (ref 1.005–1.030)
Urobilinogen, UA: 0.2 mg/dL (ref 0.0–1.0)
pH: 6.5 (ref 5.0–8.0)

## 2014-12-02 MED ORDER — TRAMADOL HCL 50 MG PO TABS
50.0000 mg | ORAL_TABLET | Freq: Four times a day (QID) | ORAL | Status: DC | PRN
Start: 2014-12-02 — End: 2015-02-15

## 2014-12-02 MED ORDER — AMOXICILLIN 500 MG PO CAPS
500.0000 mg | ORAL_CAPSULE | Freq: Once | ORAL | Status: AC
  Administered 2014-12-02: 500 mg via ORAL
  Filled 2014-12-02: qty 1

## 2014-12-02 MED ORDER — TRAMADOL HCL 50 MG PO TABS
100.0000 mg | ORAL_TABLET | Freq: Once | ORAL | Status: AC
  Administered 2014-12-02: 100 mg via ORAL
  Filled 2014-12-02: qty 2

## 2014-12-02 MED ORDER — AMOXICILLIN 500 MG PO CAPS: 500.0000 mg | ORAL_CAPSULE | Freq: Three times a day (TID) | ORAL | Status: DC
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
## 2014-12-02 NOTE — MAU Note (Addendum)
Is unsure how pregnant but has toothache and abd pain. Went to have TAB but clinic would not do it because was 13wks. That was 3-4wks ago. LMP around 9/5. Abd pain since last night. Pain is RLQ and if I move the wrong way it makes it hurt. My whole R side hurt. Abscess R lower gum for 3 days. Has had something like that before but has been awhile. States has not felt baby move in couple days and had felt movement. Denies LOF or bleeding .

## 2014-12-02 NOTE — MAU Note (Signed)
Pt. Here for evaluation due to tooth pain and right side abdominal pain. Pt. Also experiencing lower abdominal stretching. Pt. States she went to her PCP last week and was to be seen here for ultrasound but was unable to come.  Denies leakage of fluid or bleeding. Pt. States she has not felt baby move for 2 days and was feeling baby move before that. PT. States she was to see PCP about referral for a dentist for abscess tooth but was unable to make that appointment as well.

## 2014-12-02 NOTE — Discharge Instructions (Signed)
Dental Abscess A dental abscess is a collection of infected fluid (pus) from a bacterial infection in the inner part of the tooth (pulp). It usually occurs at the end of the tooth's root.  CAUSES   Severe tooth decay.  Trauma to the tooth that allows bacteria to enter into the pulp, such as a broken or chipped tooth. SYMPTOMS   Severe pain in and around the infected tooth.  Swelling and redness around the abscessed tooth or in the mouth or face.  Tenderness.  Pus drainage.  Bad breath.  Bitter taste in the mouth.  Difficulty swallowing.  Difficulty opening the mouth.  Nausea.  Vomiting.  Chills.  Swollen neck glands. DIAGNOSIS   A medical and dental history will be taken.  An examination will be performed by tapping on the abscessed tooth.  X-rays may be taken of the tooth to identify the abscess. TREATMENT The goal of treatment is to eliminate the infection. You may be prescribed antibiotic medicine to stop the infection from spreading. A root canal may be performed to save the tooth. If the tooth cannot be saved, it may be pulled (extracted) and the abscess may be drained.  HOME CARE INSTRUCTIONS  Only take over-the-counter or prescription medicines for pain, fever, or discomfort as directed by your caregiver.  Rinse your mouth (gargle) often with salt water ( tsp salt in 8 oz [250 ml] of warm water) to relieve pain or swelling.  Do not drive after taking pain medicine (narcotics).  Do not apply heat to the outside of your face.  Return to your dentist for further treatment as directed. SEEK MEDICAL CARE IF:  Your pain is not helped by medicine.  Your pain is getting worse instead of better. SEEK IMMEDIATE MEDICAL CARE IF:  You have a fever or persistent symptoms for more than 2-3 days.  You have a fever and your symptoms suddenly get worse.  You have chills or a very bad headache.  You have problems breathing or swallowing.  You have trouble  opening your mouth.  You have swelling in the neck or around the eye. Document Released: 11/25/2005 Document Revised: 08/19/2012 Document Reviewed: 03/05/2011 [facility] Patient Information 2015 [place]. This information is not intended to replace advice given to you by your health care provider. Make sure you discuss any questions you have with your health care provider.   Abdominal Pain During Pregnancy Belly (abdominal) pain is common during pregnancy. Most of the time, it is not a serious problem. Other times, it can be a sign that something is wrong with the pregnancy. Always tell your doctor if you have belly pain. HOME CARE Monitor your belly pain for any changes. The following actions may help you feel better:  Do not have sex (intercourse) or put anything in your vagina until you feel better.  Rest until your pain stops.  Drink clear fluids if you feel sick to your stomach (nauseous). Do not eat solid food until you feel better.  Only take medicine as told by your doctor.  Keep all doctor visits as told. GET HELP RIGHT AWAY IF:   You are bleeding, leaking fluid, or pieces of tissue come out of your vagina.  You have more pain or cramping.  You keep throwing up (vomiting).  You have pain when you pee (urinate) or have blood in your pee.  You have a fever.  You do not feel your baby moving as much.  You feel very weak or feel like  passing out.  You have trouble breathing, with or without belly pain.  You have a very bad headache and belly pain.  You have fluid leaking from your vagina and belly pain.  You keep having watery poop (diarrhea).  Your belly pain does not go away after resting, or the pain gets worse. MAKE SURE YOU:   Understand these instructions.  Will watch your condition.  Will get help right away if you are not doing well or get worse. Document Released: 11/13/2009 Document Revised: 07/28/2013 Document Reviewed: 06/24/2013 [facility]  Patient Information 2015 [place], [place]. This information is not intended to replace advice given to you by your health care provider. Make sure you discuss any questions you have with your health care provider.

## 2014-12-04 DIAGNOSIS — R109 Unspecified abdominal pain: Secondary | ICD-10-CM | POA: Insufficient documentation

## 2014-12-04 DIAGNOSIS — O26899 Other specified pregnancy related conditions, unspecified trimester: Secondary | ICD-10-CM | POA: Insufficient documentation

## 2014-12-04 DIAGNOSIS — Z3A15 15 weeks gestation of pregnancy: Secondary | ICD-10-CM | POA: Insufficient documentation

## 2014-12-09 NOTE — L&D Delivery Note (Signed)
Patient is 34 y.o. G6Y4034G3P2002 6141w0d admitted for SROM, hx of cHTN.  Delivery Note At 8:03 AM a viable female was delivered via  (Presentation: ROA).  APGAR: 9, 9; weight pending. Placenta status: intact.  Cord: 3V.  with the following complications: None.   Anesthesia: Epidural  Episiotomy:  None Lacerations:  Superficial labial left Est. Blood Loss (mL):  430 mL  Mom to postpartum.  Baby to Couplet care / Skin to Skin.  Upon arrival patient was complete. She pushed with good maternal effort to deliver a healthy baby girl. Baby delivered without difficulty, was noted to have good tone and place on maternal abdomen for drying and stimulation. Delayed cord clamping performed. Placenta delivered intact with 3V cord. Vaginal canal and perineum was inspected and only with superficial labial lac, hemostatic. Pitocin was started and uterus massaged until bleeding slowed. Counts of sharps, instruments, and lap pads were all correct.   Caryl AdaJazma Phelps, DO 05/13/2015, 8:26 AM PGY-1, [facility]  I was gloved and present for entire delivery No difficulty with shoulders Agree with note Aviva SignsMarie L Genesis Paget, CNM

## 2014-12-10 ENCOUNTER — Telehealth: Payer: Self-pay | Admitting: Family Medicine

## 2014-12-10 MED ORDER — FLUCONAZOLE 150 MG PO TABS: 150.0000 mg | ORAL_TABLET | Freq: Once | ORAL | Status: DC

## 2014-12-10 NOTE — Telephone Encounter (Signed)
Emergency Line / After Hours Call  Pt called the emergency line due to severe vaginal yeast infection. Currently [redacted]w[redacted]d pregnant. Was seen at the MAU on 12/25 for a dental abscess, and prescribed amoxicillin. Has now begun to have very bad vulvar itching with some discharge. Consistent with prior yeast infections, but worse. She is at work today and cannot leave to go to the doctor due as it is mandatory that she stay at work. She is very uncomfortable due to the severe itching and asks if it would be possible for [place] to call in medication for a yeast infection.  While we do not typically provide medications over the phone, this is an expected side effect of her antibiotic medication and I am thus willing to treat her without being seen. Advised that I will send in a diflucan  pill to take today, with one additional pill to repeat in 3 days if not better. If still having symptoms at that time, will need to come in for an appointment to be seen. Pt agreeable to this plan and appreciative.  Levert Feinstein, MD Family Medicine PGY-3

## 2014-12-14 ENCOUNTER — Ambulatory Visit (INDEPENDENT_AMBULATORY_CARE_PROVIDER_SITE_OTHER): Payer: Medicaid Other | Admitting: Family Medicine

## 2014-12-14 ENCOUNTER — Encounter: Payer: Self-pay | Admitting: Family Medicine

## 2014-12-14 VITALS — BP 114/77 | HR 98 | Temp 98.2°F | Wt 199.0 lb

## 2014-12-14 DIAGNOSIS — R112 Nausea with vomiting, unspecified: Secondary | ICD-10-CM

## 2014-12-14 MED ORDER — PROMETHAZINE HCL 25 MG PO TABS: 25.0000 mg | ORAL_TABLET | Freq: Three times a day (TID) | ORAL | Status: DC | PRN

## 2014-12-14 NOTE — Patient Instructions (Signed)
Thank you for coming in, today!  I think you may have a virus causing your symptoms. You might be having some diarrhea from your amoxicillin, as well. Try to drink as much as possible even if you can't eat. I will give you a prescription for Phenergan (promethazine) to take for nausea. You can take Tylenol for pain.  Make sure to keep your prenatal appointments. Try to get in to see the dentist as soon as possible. If your symptoms get worse, if you have any vaginal bleeding, or if you stop feeling the baby move, or if you keep having vision changes, go to the MAU.  Please feel free to call with any questions or concerns at any time, at [phone]. --Dr. Casper HarrisonStreet

## 2014-12-14 NOTE — Progress Notes (Signed)
   Subjective:    Patient ID: Lisa Crosby, female    DOB: 1981-03-24, 34 y.o.   MRN: [id number]  HPI: Pt presents to clinic for SDA clinic for N/V and diarrhea for 2 days, with chief complaint of "just feeling bad." She is around 17w pregnant. She is on amoxicillin for an abscessed tooth (she has been unable to get to the dentist yet) and has used OTC yeast infection medication for a subsequent yeast infection. She states she is unable to keep food down and when she does, it "goes right through her." She states she has some headaches and spots on her vision as well. She has taken some Tylenol, without any relief. She has had no fevers but has had some chills. She is concerned that she has lost some weight, as well (199 today, "a few over 200" the other day). She does have a good appetite but feels "afraid to eat." Of note, she has not had a flu shot, "this year or ever."  Of note, pt is slightly vague about some symptoms; she states, "I haven't felt the baby move as much for 2 days," but later states she feels the baby moving, currently. Also states, "I've had a little spotting when I wipe" but does not quantify or further clarify (it is unclear from her statements if this has been recent or not). Pt is able to state reasons for presentation to the MAU, but states she "hasn't had anything that I felt like needed to get checked out, until today, since I just feel so bad."  Review of Systems: As above.      Objective:   Physical Exam BP 114/77 mmHg  Pulse 98  Temp(Src) 98.2 F (36.8 C) (Oral)  Wt 199 lb (90.266 kg)  LMP 08/13/2014 Gen: ill-appearing adult female, uncomfortable but in NAD HEENT: [place]/AT, EOMI, PERRLA, TM's clear bilaterally  Nasal and posterior oropharynx mildly erythematous Neck: supple, no significant lymphadenopathy Cardio: RRR, no murmur Pulm: CTAB, no wheezes, normal WOB Abd: soft, nontender, BS+, gravid in contour; no RUQ tenderness Ext: wam, well-perfused, no LE edema GU  exam: declines, though states medication "cleared" her discharge, as above in HPI  Fetal heart tones 155 bpm with bedside doppler.     Assessment & Plan:  34yo female with nausea and vomiting, as well as loose stools - history / exam suggests viral illness +/- side effect(s) of antibiotics for dental abscess as unifying diagnosis  - vision symptoms slightly concerning in pregnant patient, but no other signs / symptoms suggestive of pre-eclampsia  Plan: - strongly suggested good hydration even if she does not feel like eating - Rx for Phenergan for nausea, which may also help with sleep - recommended continued Tylenol for pain and Benadryl for sleep, if Phenergan does not help - continue amoxicillin and strongly recommended follow-up with dentist as soon as possible - strongly recommended presentation to the MAU PRN and reviewed reasons to go (symptoms of pre-eclampsia, poor fetal movements, vaginal bleeding / discharge, etc)  F/u with PCP Dr. Jarvis NewcomerGrunz PRN, otherwise. Note FYI to him.  Bobbye Mortonhristopher M Harsha Yusko, MD PGY-3, [facility] 12/14/2014, 7:19 PM

## 2014-12-18 ENCOUNTER — Other Ambulatory Visit: Payer: Self-pay | Admitting: Family Medicine

## 2014-12-21 MED ORDER — NICOTINE 7 MG/24HR TD PT24: 7.0000 mg | MEDICATED_PATCH | Freq: Every day | TRANSDERMAL | Status: DC

## 2014-12-21 NOTE — Telephone Encounter (Signed)
Spoke with patient to inform her that rx was sent in, but patient was wanting to know if she can get a lower dosage

## 2014-12-21 NOTE — Addendum Note (Signed)
Addended by: Hazeline JunkerGRUNZ, Danissa Rundle B on: 12/21/2014 10:42 PM   Modules accepted: Orders, Medications

## 2014-12-21 NOTE — Telephone Encounter (Signed)
Sent in 7mg  dose to replace 21mg  dose. She may require 14mg  and may apply 2 patches if required. She should make an appointment with me to discuss this further. Thank you!

## 2014-12-22 ENCOUNTER — Ambulatory Visit (HOSPITAL_COMMUNITY)
Admission: RE | Admit: 2014-12-22 | Discharge: 2014-12-22 | Disposition: A | Discharge status: Home / Self Care | Payer: Medicaid Other | Source: Ambulatory Visit | Attending: Family Medicine | Admitting: Family Medicine

## 2014-12-22 DIAGNOSIS — O99210 Obesity complicating pregnancy, unspecified trimester: Secondary | ICD-10-CM | POA: Insufficient documentation

## 2014-12-22 DIAGNOSIS — O10012 Pre-existing essential hypertension complicating pregnancy, second trimester: Secondary | ICD-10-CM | POA: Insufficient documentation

## 2014-12-22 DIAGNOSIS — O10919 Unspecified pre-existing hypertension complicating pregnancy, unspecified trimester: Secondary | ICD-10-CM | POA: Insufficient documentation

## 2014-12-22 DIAGNOSIS — Z3A17 17 weeks gestation of pregnancy: Secondary | ICD-10-CM | POA: Insufficient documentation

## 2014-12-22 DIAGNOSIS — Z3490 Encounter for supervision of normal pregnancy, unspecified, unspecified trimester: Secondary | ICD-10-CM

## 2014-12-22 DIAGNOSIS — O99212 Obesity complicating pregnancy, second trimester: Secondary | ICD-10-CM | POA: Diagnosis not present

## 2014-12-22 DIAGNOSIS — Z3689 Encounter for other specified antenatal screening: Secondary | ICD-10-CM | POA: Insufficient documentation

## 2014-12-22 DIAGNOSIS — Z36 Encounter for antenatal screening of mother: Secondary | ICD-10-CM | POA: Insufficient documentation

## 2014-12-22 NOTE — Telephone Encounter (Signed)
Spoke with pt to give her below message.

## 2015-01-27 ENCOUNTER — Encounter: Payer: Medicaid Other | Admitting: Family Medicine

## 2015-02-15 ENCOUNTER — Other Ambulatory Visit (HOSPITAL_COMMUNITY)
Admission: RE | Admit: 2015-02-15 | Discharge: 2015-02-15 | Disposition: A | Discharge status: Home / Self Care | Payer: Medicaid Other | Source: Ambulatory Visit | Attending: Family Medicine | Admitting: Family Medicine

## 2015-02-15 ENCOUNTER — Encounter: Payer: Self-pay | Admitting: Family Medicine

## 2015-02-15 ENCOUNTER — Telehealth: Payer: Self-pay | Admitting: Family Medicine

## 2015-02-15 ENCOUNTER — Ambulatory Visit (INDEPENDENT_AMBULATORY_CARE_PROVIDER_SITE_OTHER): Payer: Medicaid Other | Admitting: Family Medicine

## 2015-02-15 VITALS — BP 105/63 | HR 121 | Temp 98.3°F | Wt 215.9 lb

## 2015-02-15 DIAGNOSIS — O0990 Supervision of high risk pregnancy, unspecified, unspecified trimester: Secondary | ICD-10-CM

## 2015-02-15 DIAGNOSIS — K047 Periapical abscess without sinus: Secondary | ICD-10-CM

## 2015-02-15 DIAGNOSIS — Z202 Contact with and (suspected) exposure to infections with a predominantly sexual mode of transmission: Secondary | ICD-10-CM

## 2015-02-15 DIAGNOSIS — Z113 Encounter for screening for infections with a predominantly sexual mode of transmission: Secondary | ICD-10-CM | POA: Insufficient documentation

## 2015-02-15 DIAGNOSIS — E669 Obesity, unspecified: Secondary | ICD-10-CM

## 2015-02-15 DIAGNOSIS — F17200 Nicotine dependence, unspecified, uncomplicated: Secondary | ICD-10-CM

## 2015-02-15 DIAGNOSIS — B9689 Other specified bacterial agents as the cause of diseases classified elsewhere: Secondary | ICD-10-CM

## 2015-02-15 DIAGNOSIS — O09299 Supervision of pregnancy with other poor reproductive or obstetric history, unspecified trimester: Secondary | ICD-10-CM

## 2015-02-15 DIAGNOSIS — Z720 Tobacco use: Secondary | ICD-10-CM

## 2015-02-15 DIAGNOSIS — O10919 Unspecified pre-existing hypertension complicating pregnancy, unspecified trimester: Secondary | ICD-10-CM

## 2015-02-15 DIAGNOSIS — N760 Acute vaginitis: Secondary | ICD-10-CM

## 2015-02-15 LAB — POCT WET PREP (WET MOUNT): CLUE CELLS WET PREP WHIFF POC: POSITIVE

## 2015-02-15 MED ORDER — AMOXICILLIN 500 MG PO CAPS: 500.0000 mg | ORAL_CAPSULE | Freq: Three times a day (TID) | ORAL | Status: DC

## 2015-02-15 NOTE — Assessment & Plan Note (Signed)
Complicating pregnancy

## 2015-02-15 NOTE — Progress Notes (Signed)
Lisa SayresMia F Crosby is a 34 y.o. G4P2002 at 3781w4d for routine follow up.  She reports heartburn, back pain, constipation, and frequent urination.  See flow sheet for details.  U/S 12/22/2014 at 2432w5d showed limited normal female anatomy. In breech presentation at that time with normal placentation and amniotic fluid volume. EFW 55%ile A/P: SIUP at 381w4d.  Doing well.   - Pregnancy issues include: chronic HTN, history of eclampsia, tobacco use, late to care in unwanted pregnancy, obesity, STI exposure - Needs glucola, but declined at this time, will reschedule - Follow up ASAP for glucola - Referring to high risk OB

## 2015-02-15 NOTE — Telephone Encounter (Signed)
Pt is very concerned about her results of tests taken today, said she was told that she would receive a call about them. Please call and inform pt once available / thanks sr

## 2015-02-15 NOTE — Assessment & Plan Note (Signed)
Refer to high risk OB

## 2015-02-15 NOTE — Patient Instructions (Signed)
You need a glucose tolerance test "Glucola" test. Please schedule this as soon as possible.  We are referring [facility] to high [facility] OB at [facility] due to your complicated pregnancy history and high blood pressure.  You will need a repeat ultrasound of your baby.

## 2015-02-15 NOTE — Assessment & Plan Note (Signed)
Decided to keep baby once she heard [facility]. Overview updated today.

## 2015-02-15 NOTE — Telephone Encounter (Signed)
These are not done yet. Will call once results return.

## 2015-02-16 LAB — CERVICOVAGINAL ANCILLARY ONLY
CHLAMYDIA, DNA PROBE: NEGATIVE
Neisseria Gonorrhea: NEGATIVE

## 2015-02-16 MED ORDER — METRONIDAZOLE 500 MG PO TABS: 500.0000 mg | ORAL_TABLET | Freq: Two times a day (BID) | ORAL | Status: DC

## 2015-02-16 NOTE — Telephone Encounter (Signed)
Evidence of BV on wet prep; GC/Chl negative. I have sent in flagyl to be taken twice daily for 7 days. She also needs glucola, HIV, and RPR which she did not receive before leaving clinic.

## 2015-02-16 NOTE — Telephone Encounter (Signed)
Pt is calling back to check on the status of her test results and would also like to speak to a nurse. jw

## 2015-02-16 NOTE — Addendum Note (Signed)
Addended by: Hazeline JunkerGRUNZ, RYAN B on: 02/16/2015 10:19 PM   Modules accepted: Orders

## 2015-02-18 ENCOUNTER — Telehealth: Payer: Self-pay | Admitting: Family Medicine

## 2015-02-18 NOTE — Telephone Encounter (Signed)
Patient was seen Wed and sent to high risk clinic for pregnancy complications. She was wondering what her medications at the pharmacy were for. Her questions were answered.   Lisa RudeJeremy E Jameria Bradway, MD PGY-2, [facility] 02/18/2015, 12:15 PM

## 2015-02-21 NOTE — Telephone Encounter (Signed)
Pt calling requesting test results, per policy, I cannot give pt results, please call pt with results

## 2015-02-21 NOTE — Telephone Encounter (Signed)
LVM for patient to call back. ?

## 2015-02-21 NOTE — Telephone Encounter (Signed)
Spoke with patient and gave her below message. She stated that she is now being followed by [facility] and has an appointment end of the month. She wants to know if she can file for disability due to her complications with her pregnancy

## 2015-03-09 ENCOUNTER — Encounter: Payer: Medicaid Other | Admitting: Obstetrics and Gynecology

## 2015-03-09 ENCOUNTER — Telehealth: Payer: Self-pay | Admitting: Family Medicine

## 2015-03-09 NOTE — Telephone Encounter (Signed)
Patient called this morning about 9:17 am. She states she had an appointment this morning, but was not able to make it. I rescheduled her for 04/12, but she said she couldn't wait that long and was going to call her Dr. so he can send her to another provider.

## 2015-03-09 NOTE — Telephone Encounter (Signed)
Pt did not make her appt this am to [facility].  Need to see if provider can refer her to another practice cause [facility] won't have anything available until middle of April and she want to see someone sooner.

## 2015-03-10 NOTE — Telephone Encounter (Addendum)
She should have been seen prior to today, but she should definitely be seen at either an alternative high risk OB or at least in the [facility] before 04/12.   Please contact her to have an appointment early next week with any available provider; and she [facility].

## 2015-03-13 ENCOUNTER — Telehealth: Payer: Self-pay | Admitting: Family Medicine

## 2015-03-13 NOTE — Telephone Encounter (Signed)
Pt asking to speak with someone about her appt with high risk clinic, says she can't get in until the end of April. Doesn't feel like she is getting the proper care with this pregnancy. Has not been seen in months.

## 2015-03-16 ENCOUNTER — Other Ambulatory Visit: Payer: Self-pay | Admitting: Family Medicine

## 2015-03-20 ENCOUNTER — Telehealth: Payer: Self-pay | Admitting: Family Medicine

## 2015-03-20 NOTE — Telephone Encounter (Signed)
Pt called because she did a NO show to [facility] , now they do not have any appointments until the end of May and she is due in June. She wanted to know what is she suppose to do. jw

## 2015-03-21 NOTE — Telephone Encounter (Signed)
Scheduled patient Monday 04/03/2015 with Dr. Jarvis NewcomerGrunz at 3:15pm for her OB appointment.

## 2015-03-24 ENCOUNTER — Inpatient Hospital Stay (HOSPITAL_COMMUNITY)
Admission: AD | Admit: 2015-03-24 | Discharge: 2015-03-24 | Disposition: A | Discharge status: Home / Self Care | Payer: Medicaid Other | Source: Ambulatory Visit | Attending: Obstetrics & Gynecology | Admitting: Obstetrics & Gynecology

## 2015-03-24 ENCOUNTER — Encounter (HOSPITAL_COMMUNITY): Payer: Self-pay | Admitting: *Deleted

## 2015-03-24 DIAGNOSIS — Z87891 Personal history of nicotine dependence: Secondary | ICD-10-CM | POA: Diagnosis not present

## 2015-03-24 DIAGNOSIS — R102 Pelvic and perineal pain: Secondary | ICD-10-CM | POA: Insufficient documentation

## 2015-03-24 DIAGNOSIS — O9989 Other specified diseases and conditions complicating pregnancy, childbirth and the puerperium: Secondary | ICD-10-CM | POA: Insufficient documentation

## 2015-03-24 DIAGNOSIS — K0889 Other specified disorders of teeth and supporting structures: Secondary | ICD-10-CM

## 2015-03-24 DIAGNOSIS — K088 Other specified disorders of teeth and supporting structures: Secondary | ICD-10-CM

## 2015-03-24 DIAGNOSIS — Z3A32 32 weeks gestation of pregnancy: Secondary | ICD-10-CM | POA: Diagnosis not present

## 2015-03-24 LAB — URINE MICROSCOPIC-ADD ON

## 2015-03-24 LAB — URINALYSIS, ROUTINE W REFLEX MICROSCOPIC
Bilirubin Urine: NEGATIVE
GLUCOSE, UA: NEGATIVE mg/dL
Ketones, ur: NEGATIVE mg/dL
Nitrite: NEGATIVE
Protein, ur: NEGATIVE mg/dL
SPECIFIC GRAVITY, URINE: 1.015 (ref 1.005–1.030)
Urobilinogen, UA: 0.2 mg/dL (ref 0.0–1.0)
pH: 6 (ref 5.0–8.0)

## 2015-03-24 MED ORDER — TRAMADOL HCL 50 MG PO TABS
50.0000 mg | ORAL_TABLET | Freq: Once | ORAL | Status: AC
  Administered 2015-03-24: 50 mg via ORAL
  Filled 2015-03-24: qty 1

## 2015-03-24 MED ORDER — TRAMADOL HCL 50 MG PO TABS: 50.0000 mg | ORAL_TABLET | Freq: Once | ORAL | Status: DC

## 2015-03-24 MED ORDER — LIDOCAINE VISCOUS 2 % MT SOLN: 1.0000 mL | OROMUCOSAL | Status: DC | PRN

## 2015-03-24 MED ORDER — LIDOCAINE VISCOUS 2 % MT SOLN
1.0000 mL | OROMUCOSAL | Status: DC | PRN
  Administered 2015-03-24: 1 mL via OROMUCOSAL
  Filled 2015-03-24 (×2): qty 5
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
## 2015-03-24 NOTE — MAU Note (Signed)
PT SAYS SHE HAS TOOTHACHE -  STARTED  AT 1030PM-   SHE LOOKED ON LINE- TOLD  TO  EAT GARLIC  WITH  TOOTH-   SHE THINKS  NOW  IT'S  WORSE.    PNC-  [facility]-.Marland Kitchen.   LAST SEX-  2 WEEKS.    DENIES HSV AND     PT DENIES MRSA-  SAYS HER SON HAD MRSA- 4 YEARS  AGO.     HAS VAG  PRESSURE - STARTED  AT 7 PM-.  SPOTTING  STARTED AT 7PM- WHEN SHE WIPES.

## 2015-03-24 NOTE — Discharge Instructions (Signed)

## 2015-04-03 ENCOUNTER — Ambulatory Visit (INDEPENDENT_AMBULATORY_CARE_PROVIDER_SITE_OTHER): Payer: Medicaid Other | Admitting: Family Medicine

## 2015-04-03 VITALS — BP 109/72 | HR 87 | Temp 98.0°F | Wt 215.0 lb

## 2015-04-03 DIAGNOSIS — O0990 Supervision of high risk pregnancy, unspecified, unspecified trimester: Secondary | ICD-10-CM

## 2015-04-03 MED ORDER — ASPIRIN 81 MG PO TABS: 81.0000 mg | ORAL_TABLET | Freq: Every day | ORAL | Status: DC

## 2015-04-03 NOTE — Progress Notes (Signed)
Aspirin 81mg  po daily sent to pharmacy

## 2015-04-03 NOTE — Assessment & Plan Note (Signed)
Missed [facility] appts. Re-referral made today. Is already late for glucola and 28 week labs and refuses to have them done today (has to pick someone up at 4:30) but will schedule lab only appt for tomorrow morning for these.

## 2015-04-03 NOTE — Progress Notes (Signed)
[name] is a 34 y.o. G3P2002 at 1876w2d for routine follow up.  She reports GFM, no UC's, VB or discharge. Little ankle swelling. No HA or RUQ pain.  See flow sheet for details.  A/P: Pregnancy at 1876w2d.  Doing well.   Pregnancy issues include: chronic HTN, history of eclampsia, tobacco use, late to care in unwanted pregnancy, obesity Ordered CBC, RPR, HIV, 1hr glucola today.  Follow up anatomy scan scheduled today.  Tdap to be given at lab appointment tomorrow Preterm labor precautions reviewed. Safe sleep discussed. Kick counts reviewed. Follow up ASAP at high risk OB clinic.

## 2015-04-03 NOTE — Addendum Note (Signed)
Addended by: Hazeline JunkerGRUNZ, Armetta Henri B on: 04/03/2015 04:31 PM   Modules accepted: Orders

## 2015-04-04 ENCOUNTER — Other Ambulatory Visit (INDEPENDENT_AMBULATORY_CARE_PROVIDER_SITE_OTHER): Payer: Medicaid Other

## 2015-04-04 DIAGNOSIS — Z202 Contact with and (suspected) exposure to infections with a predominantly sexual mode of transmission: Secondary | ICD-10-CM

## 2015-04-04 DIAGNOSIS — E669 Obesity, unspecified: Secondary | ICD-10-CM

## 2015-04-04 DIAGNOSIS — O0990 Supervision of high risk pregnancy, unspecified, unspecified trimester: Secondary | ICD-10-CM

## 2015-04-04 LAB — CBC
HEMATOCRIT: 35.1 % — AB (ref 36.0–46.0)
Hemoglobin: 11.8 g/dL — ABNORMAL LOW (ref 12.0–15.0)
MCH: 30.2 pg (ref 26.0–34.0)
MCHC: 33.6 g/dL (ref 30.0–36.0)
MCV: 89.8 fL (ref 78.0–100.0)
MPV: 10.6 fL (ref 8.6–12.4)
Platelets: 327 10*3/uL (ref 150–400)
RBC: 3.91 MIL/uL (ref 3.87–5.11)
RDW: 13.7 % (ref 11.5–15.5)
WBC: 12.2 10*3/uL — ABNORMAL HIGH (ref 4.0–10.5)

## 2015-04-04 LAB — GLUCOSE, CAPILLARY
Comment 1: 1
Glucose-Capillary: 105 mg/dL — ABNORMAL HIGH (ref 70–99)

## 2015-04-04 NOTE — Progress Notes (Signed)
28 week labs drawn by [facility]le[facility]:  HIV, RPR, CBC   1 Hr OB glucose = 105 mg/dL   Done on 50 gram glucola

## 2015-04-05 LAB — RPR

## 2015-04-05 LAB — HIV ANTIBODY (ROUTINE TESTING W REFLEX): HIV: NONREACTIVE

## 2015-04-12 ENCOUNTER — Ambulatory Visit (HOSPITAL_COMMUNITY)
Admission: RE | Admit: 2015-04-12 | Discharge: 2015-04-12 | Disposition: A | Discharge status: Home / Self Care | Payer: Medicaid Other | Source: Ambulatory Visit | Attending: Family Medicine | Admitting: Family Medicine

## 2015-04-12 DIAGNOSIS — O99214 Obesity complicating childbirth: Secondary | ICD-10-CM | POA: Diagnosis not present

## 2015-04-12 DIAGNOSIS — Z3A33 33 weeks gestation of pregnancy: Secondary | ICD-10-CM | POA: Insufficient documentation

## 2015-04-12 DIAGNOSIS — O10013 Pre-existing essential hypertension complicating pregnancy, third trimester: Secondary | ICD-10-CM | POA: Diagnosis not present

## 2015-04-12 DIAGNOSIS — Z36 Encounter for antenatal screening of mother: Secondary | ICD-10-CM | POA: Insufficient documentation

## 2015-04-12 DIAGNOSIS — O0990 Supervision of high risk pregnancy, unspecified, unspecified trimester: Secondary | ICD-10-CM

## 2015-04-13 ENCOUNTER — Encounter: Payer: Self-pay | Admitting: Obstetrics & Gynecology

## 2015-04-13 ENCOUNTER — Other Ambulatory Visit: Payer: Medicaid Other

## 2015-04-17 ENCOUNTER — Telehealth: Payer: Self-pay | Admitting: Family Medicine

## 2015-04-17 NOTE — Telephone Encounter (Signed)
Patient is putting herself and fetus at risk due to noncompliance with testing and missing high risk OB appointments. We will need to get her in for biweekly testing, but I don't think we are able to do this [facility] the [facility]. For now she certainly needs to at least be SEEN weekly so will have an appointment scheduled ASAP.

## 2015-04-17 NOTE — Telephone Encounter (Signed)
-----   Message from Drucilla Schmidtiane L Day, RN sent at 04/13/2015  4:16 PM EDT ----- Regarding: FYI - missed appt Hi Dr. Jarvis NewcomerGrunz.  I just wanted to let you know that your pt Lisa Crosby again did not keep her appt in our office today. There are no additional appts scheduled for her at this time either in your office or ours. Of course she needs to have twice weekly fetal testing due to Chr HTN. We have sent her a certified letter regarding the missed appt. Also I noticed that she did not get her glucola test done on 4/[facility] but had abnormal CBG. She did however go for her US on 5/4 and all was wnl for EFW and AFI.   Thanks

## 2015-04-18 ENCOUNTER — Encounter: Payer: Self-pay | Admitting: Family

## 2015-04-18 ENCOUNTER — Ambulatory Visit (INDEPENDENT_AMBULATORY_CARE_PROVIDER_SITE_OTHER): Payer: Medicaid Other | Admitting: Family

## 2015-04-18 VITALS — BP 115/68 | HR 92 | Wt 223.6 lb

## 2015-04-18 DIAGNOSIS — Z23 Encounter for immunization: Secondary | ICD-10-CM | POA: Diagnosis not present

## 2015-04-18 DIAGNOSIS — O10913 Unspecified pre-existing hypertension complicating pregnancy, third trimester: Secondary | ICD-10-CM | POA: Diagnosis present

## 2015-04-18 DIAGNOSIS — O0993 Supervision of high risk pregnancy, unspecified, third trimester: Secondary | ICD-10-CM

## 2015-04-18 DIAGNOSIS — O0990 Supervision of high risk pregnancy, unspecified, unspecified trimester: Secondary | ICD-10-CM

## 2015-04-18 LAB — POCT URINALYSIS DIP (DEVICE)
Glucose, UA: NEGATIVE mg/dL
LEUKOCYTES UA: NEGATIVE
NITRITE: NEGATIVE
PROTEIN: 100 mg/dL — AB
Urobilinogen, UA: 0.2 mg/dL (ref 0.0–1.0)
pH: 6 (ref 5.0–8.0)

## 2015-04-18 MED ORDER — TETANUS-DIPHTH-ACELL PERTUSSIS 5-2.5-18.5 LF-MCG/0.5 IM SUSP
0.5000 mL | Freq: Once | INTRAMUSCULAR | Status: AC
  Administered 2015-04-18: 0.5 mL via INTRAMUSCULAR

## 2015-04-18 NOTE — Progress Notes (Signed)
[facility] here [facility].  Missed two prior scheduled appointments to [facility].  Pt states taking HCTZ 25 mg for HTN entire pregnancy.  Consulted with Dr. Erin FullingHarraway-Cogar > pt may continue on med.  Obtain protein creatinine ratio today (baseline labs have not been obtained).  1 hr glucola collected.  Pt needs a CMP.  Reviewed growth ultrasound at 33 wks, growth 68%ile.  NST-Reactive

## 2015-04-18 NOTE — Progress Notes (Signed)
Pt states she did not know she should be taking ASA 81mg  daily - will obtain Rx today.  1hr GTT today @ 1418.  Twice weekly fetal testing initiated today.

## 2015-04-18 NOTE — Addendum Note (Signed)
Addended by: Marlis EdelsonKARIM, WALIDAH N on: 04/18/2015 02:48 PM   Modules accepted: Level of Service

## 2015-04-19 LAB — PROTEIN / CREATININE RATIO, URINE
CREATININE, URINE: 444.3 mg/dL
Protein Creatinine Ratio: 0.17 — ABNORMAL HIGH (ref <0.15)
Total Protein, Urine: 74 mg/dL — ABNORMAL HIGH (ref 5–24)

## 2015-04-19 LAB — GLUCOSE TOLERANCE, 1 HOUR (50G) W/O FASTING: GLUCOSE 1 HOUR GTT: 111 mg/dL (ref 70–140)

## 2015-04-21 ENCOUNTER — Ambulatory Visit (INDEPENDENT_AMBULATORY_CARE_PROVIDER_SITE_OTHER): Payer: Medicaid Other | Admitting: *Deleted

## 2015-04-21 VITALS — BP 119/76 | HR 127

## 2015-04-21 DIAGNOSIS — O10913 Unspecified pre-existing hypertension complicating pregnancy, third trimester: Secondary | ICD-10-CM | POA: Diagnosis present

## 2015-04-21 NOTE — Addendum Note (Signed)
Addended by: Jill SideAY, DIANE L on: 04/21/2015 12:07 PM   Modules accepted: Orders

## 2015-04-25 ENCOUNTER — Ambulatory Visit (INDEPENDENT_AMBULATORY_CARE_PROVIDER_SITE_OTHER): Payer: Medicaid Other | Admitting: Family

## 2015-04-25 VITALS — BP 113/79 | HR 109 | Wt 222.6 lb

## 2015-04-25 DIAGNOSIS — O10913 Unspecified pre-existing hypertension complicating pregnancy, third trimester: Secondary | ICD-10-CM | POA: Diagnosis present

## 2015-04-25 LAB — POCT URINALYSIS DIP (DEVICE)
BILIRUBIN URINE: NEGATIVE
Glucose, UA: NEGATIVE mg/dL
Hgb urine dipstick: NEGATIVE
Ketones, ur: NEGATIVE mg/dL
Leukocytes, UA: NEGATIVE
NITRITE: NEGATIVE
PH: 6.5 (ref 5.0–8.0)
Protein, ur: NEGATIVE mg/dL
Specific Gravity, Urine: 1.015 (ref 1.005–1.030)
UROBILINOGEN UA: 0.2 mg/dL (ref 0.0–1.0)

## 2015-04-25 MED ORDER — ASPIRIN EC 81 MG PO TBEC: 81.0000 mg | DELAYED_RELEASE_TABLET | Freq: Every day | ORAL | Status: DC

## 2015-04-25 NOTE — Progress Notes (Signed)
Increased emotions - uncertain why.  Suprapubic pain - denies vaginal bleeding or leaking of fluid.  +nausea and vomiting, approx 3x/day.  Able to eat and drink fluids.  NST-Reactive.  Ultrasound scheduled for 05/12/15.  +fetal movement.

## 2015-04-25 NOTE — Progress Notes (Signed)
Pt states she does not feel good - has abdominal pain and soreness, has nausea and vomiting. She also states she had a bad headache yesterday and it went away after sleeping.  US for growth scheduled 6/3.

## 2015-04-26 NOTE — Progress Notes (Signed)
NST 04/21/15 reactive

## 2015-04-27 ENCOUNTER — Ambulatory Visit (INDEPENDENT_AMBULATORY_CARE_PROVIDER_SITE_OTHER): Payer: Medicaid Other | Admitting: *Deleted

## 2015-04-27 VITALS — BP 117/79 | HR 108 | Wt 223.2 lb

## 2015-04-27 DIAGNOSIS — O10913 Unspecified pre-existing hypertension complicating pregnancy, third trimester: Secondary | ICD-10-CM

## 2015-04-27 LAB — POCT URINALYSIS DIP (DEVICE)
Glucose, UA: NEGATIVE mg/dL
HGB URINE DIPSTICK: NEGATIVE
KETONES UR: NEGATIVE mg/dL
Leukocytes, UA: NEGATIVE
Nitrite: NEGATIVE
PROTEIN: 30 mg/dL — AB
Specific Gravity, Urine: >=1.03 (ref 1.005–1.030)
Urobilinogen, UA: 0.2 mg/dL (ref 0.0–1.0)
pH: 6.5 (ref 5.0–8.0)

## 2015-04-27 LAB — US OB LIMITED

## 2015-04-27 NOTE — Progress Notes (Signed)
NST performed today was reviewed and was found to be reactive.  AFI normal at 14 cm.  Continue recommended antenatal testing and prenatal care.

## 2015-05-02 ENCOUNTER — Other Ambulatory Visit: Payer: Medicaid Other

## 2015-05-04 ENCOUNTER — Ambulatory Visit (INDEPENDENT_AMBULATORY_CARE_PROVIDER_SITE_OTHER): Payer: Medicaid Other | Admitting: Obstetrics and Gynecology

## 2015-05-04 ENCOUNTER — Encounter: Payer: Self-pay | Admitting: Obstetrics and Gynecology

## 2015-05-04 ENCOUNTER — Other Ambulatory Visit: Payer: Self-pay | Admitting: Obstetrics and Gynecology

## 2015-05-04 VITALS — BP 108/79 | HR 98 | Wt 220.1 lb

## 2015-05-04 DIAGNOSIS — O163 Unspecified maternal hypertension, third trimester: Secondary | ICD-10-CM

## 2015-05-04 DIAGNOSIS — O99213 Obesity complicating pregnancy, third trimester: Secondary | ICD-10-CM

## 2015-05-04 DIAGNOSIS — E669 Obesity, unspecified: Secondary | ICD-10-CM

## 2015-05-04 DIAGNOSIS — O0993 Supervision of high risk pregnancy, unspecified, third trimester: Secondary | ICD-10-CM

## 2015-05-04 DIAGNOSIS — O10913 Unspecified pre-existing hypertension complicating pregnancy, third trimester: Secondary | ICD-10-CM

## 2015-05-04 LAB — OB RESULTS CONSOLE GC/CHLAMYDIA
Chlamydia: NEGATIVE
GC PROBE AMP, GENITAL: NEGATIVE

## 2015-05-04 LAB — OB RESULTS CONSOLE GBS: GBS: NEGATIVE

## 2015-05-04 NOTE — Progress Notes (Signed)
Patient is doing well without complaints. FM/labor precautions reviewed. Cultures collected today NST reviewed and reactive

## 2015-05-04 NOTE — Progress Notes (Signed)
Patient reports a lot of pelvic pressure/pain; also reports seeing spots and frequent headaches  US for growth scheduled 6/3.  Diane Day RNC

## 2015-05-05 LAB — GC/CHLAMYDIA PROBE AMP
CT PROBE, AMP APTIMA: NEGATIVE
GC PROBE AMP APTIMA: NEGATIVE

## 2015-05-06 LAB — CULTURE, BETA STREP (GROUP B ONLY)

## 2015-05-09 ENCOUNTER — Ambulatory Visit (INDEPENDENT_AMBULATORY_CARE_PROVIDER_SITE_OTHER): Payer: Medicaid Other | Admitting: Family Medicine

## 2015-05-09 VITALS — BP 120/74 | HR 91 | Wt 223.8 lb

## 2015-05-09 DIAGNOSIS — O0993 Supervision of high risk pregnancy, unspecified, third trimester: Secondary | ICD-10-CM | POA: Diagnosis not present

## 2015-05-09 DIAGNOSIS — O10913 Unspecified pre-existing hypertension complicating pregnancy, third trimester: Secondary | ICD-10-CM

## 2015-05-09 LAB — POCT URINALYSIS DIP (DEVICE)
Bilirubin Urine: NEGATIVE
Glucose, UA: NEGATIVE mg/dL
Ketones, ur: NEGATIVE mg/dL
Leukocytes, UA: NEGATIVE
NITRITE: NEGATIVE
PROTEIN: NEGATIVE mg/dL
Specific Gravity, Urine: 1.02 (ref 1.005–1.030)
UROBILINOGEN UA: 0.2 mg/dL (ref 0.0–1.0)
pH: 6.5 (ref 5.0–8.0)

## 2015-05-09 NOTE — Progress Notes (Signed)
No complaints NST reactive Induction scheduled for 6/11 (39 weeks) FM and labor precautions given.

## 2015-05-09 NOTE — Progress Notes (Signed)
US for growth on 6/3.  IOL scheduled 6/11 @ 0800.

## 2015-05-11 ENCOUNTER — Encounter (HOSPITAL_COMMUNITY): Payer: Self-pay | Admitting: *Deleted

## 2015-05-11 ENCOUNTER — Telehealth (HOSPITAL_COMMUNITY): Payer: Self-pay | Admitting: *Deleted

## 2015-05-11 NOTE — Telephone Encounter (Signed)
Preadmission screen  

## 2015-05-12 ENCOUNTER — Encounter: Payer: Self-pay | Admitting: *Deleted

## 2015-05-12 ENCOUNTER — Inpatient Hospital Stay (HOSPITAL_COMMUNITY): Payer: Medicaid Other | Admitting: Anesthesiology

## 2015-05-12 ENCOUNTER — Ambulatory Visit (HOSPITAL_COMMUNITY)
Admission: RE | Admit: 2015-05-12 | Discharge: 2015-05-12 | Disposition: A | Discharge status: Home / Self Care | Payer: Medicaid Other | Source: Ambulatory Visit | Attending: Family | Admitting: Family

## 2015-05-12 ENCOUNTER — Inpatient Hospital Stay (HOSPITAL_COMMUNITY)
Admission: AD | Admit: 2015-05-12 | Discharge: 2015-05-15 | DRG: 774 | Disposition: A | Discharge status: Home / Self Care | Payer: Medicaid Other | Source: Ambulatory Visit | Attending: Obstetrics & Gynecology | Admitting: Obstetrics & Gynecology

## 2015-05-12 ENCOUNTER — Ambulatory Visit (INDEPENDENT_AMBULATORY_CARE_PROVIDER_SITE_OTHER): Payer: Self-pay | Admitting: *Deleted

## 2015-05-12 ENCOUNTER — Encounter (HOSPITAL_COMMUNITY): Payer: Self-pay | Admitting: Obstetrics

## 2015-05-12 DIAGNOSIS — O99213 Obesity complicating pregnancy, third trimester: Secondary | ICD-10-CM | POA: Insufficient documentation

## 2015-05-12 DIAGNOSIS — Z6838 Body mass index (BMI) 38.0-38.9, adult: Secondary | ICD-10-CM | POA: Diagnosis not present

## 2015-05-12 DIAGNOSIS — O99214 Obesity complicating childbirth: Secondary | ICD-10-CM | POA: Diagnosis present

## 2015-05-12 DIAGNOSIS — O10913 Unspecified pre-existing hypertension complicating pregnancy, third trimester: Secondary | ICD-10-CM

## 2015-05-12 DIAGNOSIS — Z3A38 38 weeks gestation of pregnancy: Secondary | ICD-10-CM | POA: Diagnosis not present

## 2015-05-12 DIAGNOSIS — O99334 Smoking (tobacco) complicating childbirth: Secondary | ICD-10-CM | POA: Diagnosis present

## 2015-05-12 DIAGNOSIS — IMO0001 Reserved for inherently not codable concepts without codable children: Secondary | ICD-10-CM

## 2015-05-12 DIAGNOSIS — Z3A37 37 weeks gestation of pregnancy: Secondary | ICD-10-CM | POA: Diagnosis present

## 2015-05-12 DIAGNOSIS — E669 Obesity, unspecified: Secondary | ICD-10-CM | POA: Diagnosis present

## 2015-05-12 DIAGNOSIS — F17210 Nicotine dependence, cigarettes, uncomplicated: Secondary | ICD-10-CM | POA: Diagnosis present

## 2015-05-12 DIAGNOSIS — O1092 Unspecified pre-existing hypertension complicating childbirth: Secondary | ICD-10-CM | POA: Diagnosis not present

## 2015-05-12 DIAGNOSIS — Z3483 Encounter for supervision of other normal pregnancy, third trimester: Secondary | ICD-10-CM | POA: Diagnosis present

## 2015-05-12 LAB — CBC
HCT: 35.1 % — ABNORMAL LOW (ref 36.0–46.0)
HEMOGLOBIN: 12 g/dL (ref 12.0–15.0)
MCH: 30.3 pg (ref 26.0–34.0)
MCHC: 34.2 g/dL (ref 30.0–36.0)
MCV: 88.6 fL (ref 78.0–100.0)
Platelets: 277 10*3/uL (ref 150–400)
RBC: 3.96 MIL/uL (ref 3.87–5.11)
RDW: 13.8 % (ref 11.5–15.5)
WBC: 14.7 10*3/uL — ABNORMAL HIGH (ref 4.0–10.5)

## 2015-05-12 LAB — URINALYSIS, ROUTINE W REFLEX MICROSCOPIC
BILIRUBIN URINE: NEGATIVE
Glucose, UA: NEGATIVE mg/dL
HGB URINE DIPSTICK: NEGATIVE
Leukocytes, UA: NEGATIVE
NITRITE: NEGATIVE
PH: 6 (ref 5.0–8.0)
Protein, ur: 30 mg/dL — AB
Specific Gravity, Urine: >1.03 — ABNORMAL HIGH (ref 1.005–1.030)
Urobilinogen, UA: 0.2 mg/dL (ref 0.0–1.0)

## 2015-05-12 LAB — COMPREHENSIVE METABOLIC PANEL
ALBUMIN: 2.8 g/dL — AB (ref 3.5–5.0)
ALK PHOS: 202 U/L — AB (ref 38–126)
ALT: 11 U/L — AB (ref 14–54)
ANION GAP: 6 (ref 5–15)
AST: 16 U/L (ref 15–41)
BUN: 7 mg/dL (ref 6–20)
CO2: 19 mmol/L — ABNORMAL LOW (ref 22–32)
Calcium: 8.6 mg/dL — ABNORMAL LOW (ref 8.9–10.3)
Chloride: 110 mmol/L (ref 101–111)
Creatinine, Ser: 0.63 mg/dL (ref 0.44–1.00)
GFR calc Af Amer: >60 mL/min (ref >60)
GFR calc non Af Amer: >60 mL/min (ref >60)
GLUCOSE: 80 mg/dL (ref 65–99)
Potassium: 3.8 mmol/L (ref 3.5–5.1)
Sodium: 135 mmol/L (ref 135–145)
TOTAL PROTEIN: 6.7 g/dL (ref 6.5–8.1)
Total Bilirubin: 0.4 mg/dL (ref 0.3–1.2)

## 2015-05-12 LAB — TYPE AND SCREEN
ABO/RH(D): B POS
Antibody Screen: NEGATIVE

## 2015-05-12 LAB — PROTEIN / CREATININE RATIO, URINE
Creatinine, Urine: 346 mg/dL
Protein Creatinine Ratio: 0.18 mg/mg{Cre} — ABNORMAL HIGH (ref 0.00–0.15)
TOTAL PROTEIN, URINE: 61 mg/dL

## 2015-05-12 LAB — URINE MICROSCOPIC-ADD ON

## 2015-05-12 LAB — ABO/RH: ABO/RH(D): B POS

## 2015-05-12 MED ORDER — CITRIC ACID-SODIUM CITRATE 334-500 MG/5ML PO SOLN: 30.0000 mL | ORAL | Status: DC | PRN

## 2015-05-12 MED ORDER — HYDROCHLOROTHIAZIDE 25 MG PO TABS
25.0000 mg | ORAL_TABLET | Freq: Every day | ORAL | Status: DC
  Administered 2015-05-12: 25 mg via ORAL
  Filled 2015-05-12 (×2): qty 1

## 2015-05-12 MED ORDER — DIPHENHYDRAMINE HCL 50 MG/ML IJ SOLN: 12.5000 mg | INTRAMUSCULAR | Status: DC | PRN

## 2015-05-12 MED ORDER — OXYCODONE-ACETAMINOPHEN 5-325 MG PO TABS: 2.0000 | ORAL_TABLET | ORAL | Status: DC | PRN

## 2015-05-12 MED ORDER — FENTANYL CITRATE (PF) 100 MCG/2ML IJ SOLN: 50.0000 ug | INTRAMUSCULAR | Status: DC | PRN

## 2015-05-12 MED ORDER — FENTANYL 2.5 MCG/ML BUPIVACAINE 1/10 % EPIDURAL INFUSION (WH - ANES)
14.0000 mL/h | INTRAMUSCULAR | Status: DC | PRN
Start: 2015-05-12 — End: 2015-05-13
  Administered 2015-05-12 – 2015-05-13 (×2): 14 mL/h via EPIDURAL
  Filled 2015-05-12 (×3): qty 125

## 2015-05-12 MED ORDER — PHENYLEPHRINE 40 MCG/ML (10ML) SYRINGE FOR IV PUSH (FOR BLOOD PRESSURE SUPPORT)
80.0000 ug | PREFILLED_SYRINGE | INTRAVENOUS | Status: DC | PRN
  Filled 2015-05-12: qty 2
  Filled 2015-05-12: qty 20

## 2015-05-12 MED ORDER — LACTATED RINGERS IV SOLN
500.0000 mL | INTRAVENOUS | Status: DC | PRN
  Administered 2015-05-13: 500 mL via INTRAVENOUS

## 2015-05-12 MED ORDER — OXYCODONE-ACETAMINOPHEN 5-325 MG PO TABS
1.0000 | ORAL_TABLET | ORAL | Status: DC | PRN
  Administered 2015-05-13: 1 via ORAL
  Filled 2015-05-12: qty 1

## 2015-05-12 MED ORDER — OXYTOCIN BOLUS FROM INFUSION
500.0000 mL | INTRAVENOUS | Status: DC
  Administered 2015-05-13: 500 mL via INTRAVENOUS

## 2015-05-12 MED ORDER — EPHEDRINE 5 MG/ML INJ
10.0000 mg | INTRAVENOUS | Status: DC | PRN
  Filled 2015-05-12: qty 2

## 2015-05-12 MED ORDER — OXYTOCIN 40 UNITS IN LACTATED RINGERS INFUSION - SIMPLE MED
1.0000 m[IU]/min | INTRAVENOUS | Status: DC
  Administered 2015-05-12: 2 m[IU]/min via INTRAVENOUS
  Filled 2015-05-12: qty 1000

## 2015-05-12 MED ORDER — OXYTOCIN 40 UNITS IN LACTATED RINGERS INFUSION - SIMPLE MED: 62.5000 mL/h | INTRAVENOUS | Status: DC

## 2015-05-12 MED ORDER — LACTATED RINGERS IV SOLN
INTRAVENOUS | Status: DC
  Administered 2015-05-12 (×2): via INTRAVENOUS

## 2015-05-12 MED ORDER — FENTANYL 2.5 MCG/ML BUPIVACAINE 1/10 % EPIDURAL INFUSION (WH - ANES): 14.0000 mL/h | INTRAMUSCULAR | Status: DC | PRN

## 2015-05-12 MED ORDER — LIDOCAINE HCL (PF) 1 % IJ SOLN
30.0000 mL | INTRAMUSCULAR | Status: DC | PRN
  Filled 2015-05-12: qty 30

## 2015-05-12 MED ORDER — LIDOCAINE HCL (PF) 1 % IJ SOLN
INTRAMUSCULAR | Status: DC | PRN
  Administered 2015-05-12: 6 mL
  Administered 2015-05-12: 4 mL

## 2015-05-12 MED ORDER — FLEET ENEMA 7-19 GM/118ML RE ENEM: 1.0000 | ENEMA | RECTAL | Status: DC | PRN

## 2015-05-12 MED ORDER — TERBUTALINE SULFATE 1 MG/ML IJ SOLN: 0.2500 mg | Freq: Once | INTRAMUSCULAR | Status: AC | PRN

## 2015-05-12 MED ORDER — ACETAMINOPHEN 325 MG PO TABS: 650.0000 mg | ORAL_TABLET | ORAL | Status: DC | PRN

## 2015-05-12 MED ORDER — ONDANSETRON HCL 4 MG/2ML IJ SOLN: 4.0000 mg | Freq: Four times a day (QID) | INTRAMUSCULAR | Status: DC | PRN

## 2015-05-12 NOTE — Anesthesia Procedure Notes (Signed)
Epidural Patient location during procedure: OB  Preanesthetic Checklist Completed: patient identified, site marked, surgical consent, pre-op evaluation, timeout performed, IV checked, risks and benefits discussed and monitors and equipment checked  Epidural Patient position: sitting Prep: site prepped and draped and DuraPrep Patient monitoring: continuous pulse ox and blood pressure Approach: midline Location: L3-L4 Injection technique: LOR air  Needle:  Needle type: Tuohy  Needle gauge: 17 G Needle length: 9 cm and 9 Needle insertion depth: 7 cm Catheter type: closed end flexible Catheter size: 19 Gauge Catheter at skin depth: 14 cm Test dose: negative  Assessment Events: blood not aspirated, injection not painful, no injection resistance, negative IV test and no paresthesia  Additional Notes Dosing of Epidural:  1st dose, through catheter .............................................  Xylocaine 40 mg  2nd dose, through catheter, after waiting 3 minutes.........Xylocaine 60 mg    ( 1% Xylo charted as a single dose in Epic Meds for ease of charting; actual dosing was fractionated as above, for saftey's sake)  As each dose occurred, patient was free of IV sx; and patient exhibited no evidence of SA injection.  Patient is more comfortable after epidural dosed. Please see RN's note for documentation of vital signs,and FHR which are stable.  Patient reminded not to try to ambulate with numb legs, and that an RN must be present when she attempts to get up.       

## 2015-05-12 NOTE — Progress Notes (Signed)
Labor Progress Note  Lisa Crosby is a 34 y.o. G3P2002 at 4122w6d  admitted for rupture of membranes  S: Patient doing well. Comfortable without complaints.   O:  BP 114/73 mmHg  Pulse 89  Temp(Src) 98.2 F (36.8 C) (Oral)  Resp 16  Ht 5\' 4"  (1.626 m)  Wt 223 lb (101.152 kg)  BMI 38.26 kg/m2  SpO2 99%  LMP 08/13/2014 (Approximate) FHT:  FHR: 130 bpm, variability: moderate,  accelerations:  Present,  decelerations:  Absent UC:   regular, every 6 minutes SVE:   Dilation: 3 Effacement (%): 70 Station: -2 Exam by:: L.Mears,RN SROM @1615 : clear  Labs: Lab Results  Component Value Date   WBC 14.7* 05/12/2015   HGB 12.0 05/12/2015   HCT 35.1* 05/12/2015   MCV 88.6 05/12/2015   PLT 277 05/12/2015    Assessment / Plan: 34 y.o. G3P2002 3022w6d in early labor Spontaneous labor, progressing normally  Labor: Progressing normally. Will add Pit for augmentation of labor due to inadequate contraction pattern.  Fetal Wellbeing:  Category I Pain Control:  Epidural Anticipated MOD:  NSVD  Dr. Jarvis NewcomerGrunz will be notified when patient is in active labor.  Expectant management   Caryl AdaJazma Phelps, DO 05/12/2015, 9:24 PM PGY-1, [facility]

## 2015-05-12 NOTE — Anesthesia Preprocedure Evaluation (Signed)
Anesthesia Evaluation  Patient identified by MRN, date of birth, ID band Patient awake    Reviewed: Allergy & Precautions, H&P , Patient's Chart, lab work & pertinent test results  Airway Mallampati: II  TM Distance: >3 FB Neck ROM: full    Dental  (+) Teeth Intact   Pulmonary former smoker,  breath sounds clear to auscultation        Cardiovascular hypertension, On Medications Rhythm:regular Rate:Normal     Neuro/Psych    GI/Hepatic   Endo/Other    Renal/GU      Musculoskeletal   Abdominal   Peds  Hematology   Anesthesia Other Findings       Reproductive/Obstetrics (+) Pregnancy                             Anesthesia Physical Anesthesia Plan  ASA: II  Anesthesia Plan: Epidural   Post-op Pain Management:    Induction:   Airway Management Planned:   Additional Equipment:   Intra-op Plan:   Post-operative Plan:   Informed Consent: I have reviewed the patients History and Physical, chart, labs and discussed the procedure including the risks, benefits and alternatives for the proposed anesthesia with the patient or authorized representative who has indicated his/her understanding and acceptance.   Dental Advisory Given  Plan Discussed with:   Anesthesia Plan Comments: (Labs checked- platelets confirmed with RN in room. Fetal heart tracing, per RN, reported to be stable enough for sitting procedure. Discussed epidural, and patient consents to the procedure:  included risk of possible headache,backache, failed block, allergic reaction, and nerve injury. This patient was asked if she had any questions or concerns before the procedure started.)        Anesthesia Quick Evaluation

## 2015-05-12 NOTE — MAU Note (Signed)
Per Urban GibsonBobbi Jo, RN charge, pt to go to room 174
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
## 2015-05-12 NOTE — Progress Notes (Signed)
US for growth done today 

## 2015-05-12 NOTE — Progress Notes (Signed)
NST reactive.

## 2015-05-13 ENCOUNTER — Encounter (HOSPITAL_COMMUNITY): Payer: Self-pay | Admitting: *Deleted

## 2015-05-13 DIAGNOSIS — Z3A38 38 weeks gestation of pregnancy: Secondary | ICD-10-CM

## 2015-05-13 DIAGNOSIS — O1092 Unspecified pre-existing hypertension complicating childbirth: Secondary | ICD-10-CM

## 2015-05-13 LAB — CBC
HEMATOCRIT: 35 % — AB (ref 36.0–46.0)
Hemoglobin: 11.7 g/dL — ABNORMAL LOW (ref 12.0–15.0)
MCH: 29.6 pg (ref 26.0–34.0)
MCHC: 33.4 g/dL (ref 30.0–36.0)
MCV: 88.6 fL (ref 78.0–100.0)
PLATELETS: 241 10*3/uL (ref 150–400)
RBC: 3.95 MIL/uL (ref 3.87–5.11)
RDW: 13.7 % (ref 11.5–15.5)
WBC: 17.3 10*3/uL — AB (ref 4.0–10.5)

## 2015-05-13 LAB — RPR: RPR: NONREACTIVE

## 2015-05-13 MED ORDER — BENZOCAINE-MENTHOL 20-0.5 % EX AERO
1.0000 "application " | INHALATION_SPRAY | CUTANEOUS | Status: DC | PRN
  Filled 2015-05-13: qty 56

## 2015-05-13 MED ORDER — ONDANSETRON HCL 4 MG/2ML IJ SOLN: 4.0000 mg | INTRAMUSCULAR | Status: DC | PRN

## 2015-05-13 MED ORDER — DIBUCAINE 1 % RE OINT: 1.0000 "application " | TOPICAL_OINTMENT | RECTAL | Status: DC | PRN

## 2015-05-13 MED ORDER — SENNOSIDES-DOCUSATE SODIUM 8.6-50 MG PO TABS
2.0000 | ORAL_TABLET | ORAL | Status: DC
  Administered 2015-05-13 – 2015-05-14 (×3): 2 via ORAL
  Filled 2015-05-13 (×3): qty 2

## 2015-05-13 MED ORDER — ONDANSETRON HCL 4 MG PO TABS: 4.0000 mg | ORAL_TABLET | ORAL | Status: DC | PRN

## 2015-05-13 MED ORDER — ACETAMINOPHEN 325 MG PO TABS: 650.0000 mg | ORAL_TABLET | ORAL | Status: DC | PRN

## 2015-05-13 MED ORDER — OXYCODONE-ACETAMINOPHEN 5-325 MG PO TABS
1.0000 | ORAL_TABLET | ORAL | Status: DC | PRN
  Administered 2015-05-13 – 2015-05-14 (×5): 1 via ORAL
  Filled 2015-05-13 (×7): qty 1

## 2015-05-13 MED ORDER — DIPHENHYDRAMINE HCL 25 MG PO CAPS: 25.0000 mg | ORAL_CAPSULE | Freq: Four times a day (QID) | ORAL | Status: DC | PRN

## 2015-05-13 MED ORDER — IBUPROFEN 600 MG PO TABS
600.0000 mg | ORAL_TABLET | Freq: Four times a day (QID) | ORAL | Status: DC
  Administered 2015-05-13 – 2015-05-15 (×9): 600 mg via ORAL
  Filled 2015-05-13 (×9): qty 1

## 2015-05-13 MED ORDER — LANOLIN HYDROUS EX OINT: TOPICAL_OINTMENT | CUTANEOUS | Status: DC | PRN

## 2015-05-13 MED ORDER — MISOPROSTOL 200 MCG PO TABS
ORAL_TABLET | ORAL | Status: AC
  Filled 2015-05-13: qty 4

## 2015-05-13 MED ORDER — OXYCODONE-ACETAMINOPHEN 5-325 MG PO TABS
2.0000 | ORAL_TABLET | ORAL | Status: DC | PRN
Start: 2015-05-13 — End: 2015-05-15
  Administered 2015-05-14 – 2015-05-15 (×6): 2 via ORAL
  Filled 2015-05-13 (×5): qty 2

## 2015-05-13 MED ORDER — SIMETHICONE 80 MG PO CHEW: 80.0000 mg | CHEWABLE_TABLET | ORAL | Status: DC | PRN

## 2015-05-13 MED ORDER — ZOLPIDEM TARTRATE 5 MG PO TABS: 5.0000 mg | ORAL_TABLET | Freq: Every evening | ORAL | Status: DC | PRN

## 2015-05-13 MED ORDER — PRENATAL MULTIVITAMIN CH
1.0000 | ORAL_TABLET | Freq: Every day | ORAL | Status: DC
  Administered 2015-05-14 – 2015-05-15 (×2): 1 via ORAL
  Filled 2015-05-13 (×2): qty 1

## 2015-05-13 MED ORDER — MISOPROSTOL 200 MCG PO TABS
800.0000 ug | ORAL_TABLET | Freq: Once | ORAL | Status: AC
  Administered 2015-05-13: 800 ug via RECTAL

## 2015-05-13 MED ORDER — TETANUS-DIPHTH-ACELL PERTUSSIS 5-2.5-18.5 LF-MCG/0.5 IM SUSP: 0.5000 mL | Freq: Once | INTRAMUSCULAR | Status: DC

## 2015-05-13 MED ORDER — MISOPROSTOL 200 MCG PO TABS
ORAL_TABLET | ORAL | Status: AC
  Administered 2015-05-13: 800 ug via RECTAL
  Filled 2015-05-13: qty 4

## 2015-05-13 MED ORDER — WITCH HAZEL-GLYCERIN EX PADS: 1.0000 "application " | MEDICATED_PAD | CUTANEOUS | Status: DC | PRN

## 2015-05-13 NOTE — Progress Notes (Signed)
Upon arrival to room, patient stated she needed to void.  She stood from wheelchair and her left leg appeared heavy and she was unable to bear full weight to walk.  Patient assisted with the stedy to the bathroom and back to bed. Discussed with patient to call for assistance from the staff to go to the bathroom.

## 2015-05-13 NOTE — Progress Notes (Signed)
Patient ID: Lisa Crosby, female   DOB: 08-30-1981, 34 y.o.   MRN: [id number] Doing well but tired  Filed Vitals:   05/13/15 0337 05/13/15 0401 05/13/15 0431 05/13/15 0501  BP: 116/70 113/50 121/81 103/88  Pulse: 76 91 75 80  Temp:   98.4 F (36.9 C)   TempSrc:   Oral   Resp:   16   Height:      Weight:      SpO2:       FHR stable UCs more adequate  Dilation: 4.5 Effacement (%): 80 Cervical Position: Middle Station: -2 Presentation: Vertex Exam by:: Karlon Schlafer cnm  There is appreciable change in dilation, effacement and station since my last exam at 2:30

## 2015-05-13 NOTE — Progress Notes (Signed)
Upon entering room, patient stated she had gotten up with the assistance of her support person and voided.  Stated her leg "felt better" and she could walk with no assistance.

## 2015-05-13 NOTE — Progress Notes (Signed)
Patient ID: Clement SayresMia F Broxterman, female   DOB: 03-20-1981, 34 y.o.   MRN: [id number] Doing well.  Filed Vitals:   05/13/15 0101 05/13/15 0131 05/13/15 0201 05/13/15 0231  BP: 124/81 135/89 135/80 116/69  Pulse: 87 79 80 82  Temp:    98.3 F (36.8 C)  TempSrc:    Oral  Resp: 18 18 18 18   Height:      Weight:      SpO2:       FHR reassuring Unable to monitor UCs  AROM forebag of clear fluid  Dilation: 3.5 Effacement (%): 80 Cervical Position: Middle Station: -3 Presentation: Vertex Exam by:: Artelia LarocheM. Mahima Hottle CNM  IUPC inserted

## 2015-05-13 NOTE — Anesthesia Postprocedure Evaluation (Signed)
  Anesthesia Post-op Note  Patient: Lisa SayresMia F Garlock  Procedure(s) Performed: * No procedures listed *  Patient Location: Mother/Baby  Anesthesia Type:Epidural  Level of Consciousness: awake  Airway and Oxygen Therapy: Patient Spontanous Breathing  Post-op Pain: mild  Post-op Assessment: Post-op Vital signs reviewed, Patient's Cardiovascular Status Stable, Respiratory Function Stable, No signs of Nausea or vomiting, Pain level controlled, No headache, No residual numbness and No residual motor weakness  Post-op Vital Signs: Reviewed  Last Vitals:  Filed Vitals:   05/13/15 1215  BP: 108/63  Pulse: 79  Temp: 36.8 C  Resp: 18    Complications: No apparent anesthesia complications

## 2015-05-13 NOTE — Progress Notes (Signed)
PCP Note   As patient's PCP I stopped by to visit [name] this morning, still in her birthing suite room. She is resting quietly with her daughter, Lisa Crosby, and her pain is controlled. We discussed her labor and the plan for the rest of her hospitalization. I will be by to see her and [facility] tomorrow morning as well. I explained that I am hopeful that she can be discharged at 24 hours, but that this is subject to their progress today. I appreciate the care provided by the providers of [facility]. If I can help in any way, I can be reached by cell phone at [phone].   Kota Ciancio B. Jarvis NewcomerGrunz, MD, PGY-2 05/13/2015 11:47 AM

## 2015-05-14 LAB — CBC
HEMATOCRIT: 29.2 % — AB (ref 36.0–46.0)
HEMOGLOBIN: 9.8 g/dL — AB (ref 12.0–15.0)
MCH: 29.6 pg (ref 26.0–34.0)
MCHC: 33.6 g/dL (ref 30.0–36.0)
MCV: 88.2 fL (ref 78.0–100.0)
PLATELETS: 246 10*3/uL (ref 150–400)
RBC: 3.31 MIL/uL — ABNORMAL LOW (ref 3.87–5.11)
RDW: 13.7 % (ref 11.5–15.5)
WBC: 15.7 10*3/uL — ABNORMAL HIGH (ref 4.0–10.5)

## 2015-05-14 NOTE — Clinical Social Work Maternal (Signed)
  CLINICAL SOCIAL WORK MATERNAL/CHILD NOTE  Patient Details  Name: Lisa Crosby MRN: [id number] Date of Birth: 04/19/1981  Date:  05/14/2015  Clinical Social Worker Initiating Note:  Norlene Duel, LCSW Date/ Time Initiated:  05/14/15/1400     Child's Name:      Legal Guardian:  Mother   Need for Interpreter:  None   Date of Referral:  05/13/15     Reason for Referral:  Other (Comment)   Referral Source:  [employer]   Address:  [address] [place], [place] [address]  Phone number:   [phone])   Household Members:  Minor Children, Friends   Therapist, music (not living in the home):  Extended Family, Friends   Chiropodist: None   Employment: Unemployed   Type of Work:     Education:      Pensions consultant:  [employer]   Other Resources:  [employer], Physicist, medical    Cultural/Religious Considerations Which May Impact Care:  none noted  Strengths:  Ability to meet basic needs , Compliance with medical plan , Home prepared for child    Risk Factors/Current Problems:   (unstable housing)   Cognitive State:  Alert , Able to Concentrate    Mood/Affect:  Bright , Happy , Calm    CSW Assessment:  Acknowledged order for social work consult to assess mother's hx of mental illness and substance abuse.    Mother also had limited prenatal care.    Met with mother who was pleasant and receptive to social work.   She is a single parent with two other dependents ages 32 and 28.   MOB states that she was diagnosed with depression during this pregnancy and was on medication until 2 months ago.  She denies any current symptoms of anxiety or depression.  She also denies any hx of psychiatric hospitalization.  Mother states that she was considering abortion, made the plans and went to the clinic to terminate the pregnancy, but decided against it at the last minute.  She states that she is happy with her decision to parent this child.  Mother reports hx of marijuana,  but denies any use since age 34.  She also denies any other illicit drug use.   Mother states that she was employed, but had to stop working due to the pregnancy and ended up losing her apartment.  She is currently residing with a good friend who has reportedly been very supportive.  Mother states that she hopes to begin working again after her 6 week checkup and regain her independence.  She has been able to connect with available resources in the community.   She was not interested in homeless shelters.   She was informed of social work Fish farm manager.   CSW Plan/Description:     Provided information and resources on PP Depression No further intervention required No barriers to discharge Will continue to monitor drug screen  Joenathan Sakuma J, LCSW 05/14/2015, 4:29 PM

## 2015-05-14 NOTE — Lactation Note (Signed)
This note was copied from the chart of Lisa Shelbie AmmonsMia Correnti. Lactation Consultation Note  Attempted consult. MOB saw my lactation tag and told me that she was "OK" and to leave the paperwork on the counter.  Patient Name: Lisa Crosby RUEAV'WToday's Date: 05/14/2015     Maternal Data    Feeding    LATCH Score/Interventions                      Lactation Tools Discussed/Used     Consult Status      [name], Emarie Paul 05/14/2015, 12:56 PM
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
## 2015-05-15 MED ORDER — IBUPROFEN 600 MG PO TABS: 600.0000 mg | ORAL_TABLET | Freq: Four times a day (QID) | ORAL | Status: DC

## 2015-05-15 MED ORDER — OXYCODONE-ACETAMINOPHEN 5-325 MG PO TABS: 2.0000 | ORAL_TABLET | ORAL | Status: DC | PRN

## 2015-05-15 NOTE — Discharge Summary (Signed)
Obstetric Discharge Summary Reason for Admission: onset of labor Prenatal Procedures: ultrasound Intrapartum Procedures: spontaneous vaginal delivery Postpartum Procedures: none Complications-Operative and Postpartum: none HEMOGLOBIN  Date Value Ref Range Status  05/14/2015 9.8* 12.0 - 15.0 g/dL Final   HCT  Date Value Ref Range Status  05/14/2015 29.2* 36.0 - 46.0 % Final    Physical Exam:  General: alert, cooperative, appears stated age and no distress Lochia: appropriate Uterine Fundus: firm Incision: n/a DVT Evaluation: No evidence of DVT seen on physical exam. Negative Homan's sign. No cords or calf tenderness. Positive Homan's sign.  Discharge Diagnoses: Term Pregnancy-delivered  Discharge Information: Date: 05/15/2015 Activity: pelvic rest Diet: routine Medications: PNV, Ibuprofen and Percocet Condition: stable and improved Instructions: refer to practice specific booklet Discharge to: home Follow-up Information    [facility] Go in 6 [facility].   Contact information:   [address] [place] [id number] [phone]      Newborn Data: Live born female  Birth Weight: 6 lb 8.9 oz (2975 g) APGAR: 8, 9  Home with mother.  LAWSON, MARIE DARLENE 05/15/2015, 7:17 AM

## 2015-05-15 NOTE — Progress Notes (Signed)
Post discharge chart review completed.  

## 2015-05-15 NOTE — Lactation Note (Signed)
This note was copied from the chart of Girl Shelbie AmmonsMia Sortino. Lactation Consultation Note  Patient Name: Girl Shelbie AmmonsMia Gerads GNFAO'ZToday's Date: 05/15/2015 Reason for consult: Follow-up assessment Mom unsure if baby will be d/c today. Mom declines any question/concerns or need for help by LC. Mom advised she will call if she wants assist.   Maternal Data    Feeding Feeding Type: Breast Fed Length of feed: 25 min  LATCH Score/Interventions Latch: Grasps breast easily, tongue down, lips flanged, rhythmical sucking.  Audible Swallowing: Spontaneous and intermittent  Type of Nipple: Everted at rest and after stimulation  Comfort (Breast/Nipple): Soft / non-tender     Hold (Positioning): No assistance needed to correctly position infant at breast.  LATCH Score: 10  Lactation Tools Discussed/Used     Consult Status Consult Status: Complete    Alfred LevinsGranger, Khaleah Duer Ann 05/15/2015, 11:36 AM

## 2015-05-16 ENCOUNTER — Encounter: Payer: Self-pay | Admitting: Medical

## 2015-05-16 ENCOUNTER — Other Ambulatory Visit: Payer: Medicaid Other

## 2015-05-19 ENCOUNTER — Other Ambulatory Visit: Payer: Medicaid Other

## 2015-05-20 ENCOUNTER — Inpatient Hospital Stay (HOSPITAL_COMMUNITY): Admission: RE | Admit: 2015-05-20 | Payer: Medicaid Other | Source: Ambulatory Visit

## 2015-05-24 ENCOUNTER — Encounter: Payer: Self-pay | Admitting: Obstetrics & Gynecology

## 2015-06-09 ENCOUNTER — Ambulatory Visit: Payer: Self-pay | Admitting: Medical

## 2015-06-15 ENCOUNTER — Other Ambulatory Visit: Payer: Self-pay | Admitting: Family Medicine

## 2015-06-15 MED ORDER — HYDROCORTISONE 2.5 % RE CREA: 1.0000 "application " | TOPICAL_CREAM | Freq: Two times a day (BID) | RECTAL | Status: DC

## 2015-06-20 ENCOUNTER — Telehealth: Payer: Self-pay | Admitting: Family Medicine

## 2015-06-20 MED ORDER — NYSTATIN 100000 UNIT/ML MT SUSP: 5.0000 mL | Freq: Four times a day (QID) | OROMUCOSAL | Status: DC

## 2015-06-20 NOTE — Telephone Encounter (Signed)
Message delivered

## 2015-06-20 NOTE — Telephone Encounter (Signed)
Sent in nystatin.  If doesn't clear, needs to be seen.  Please call to inform mom.

## 2015-06-20 NOTE — Telephone Encounter (Signed)
LMOV for pt to call the office.  Please inform pt of information below. Sunday SpillersSharon T Petros Ahart, CMA

## 2015-06-20 NOTE — Telephone Encounter (Signed)
Baby has thrush.  Mom now has it on her breasts. Need something to clear this up  Please advise

## 2015-06-21 NOTE — Telephone Encounter (Signed)
error 

## 2015-06-22 ENCOUNTER — Other Ambulatory Visit (HOSPITAL_COMMUNITY)
Admission: RE | Admit: 2015-06-22 | Discharge: 2015-06-22 | Disposition: A | Discharge status: Home / Self Care | Payer: Medicaid Other | Source: Ambulatory Visit | Attending: Obstetrics and Gynecology | Admitting: Obstetrics and Gynecology

## 2015-06-22 ENCOUNTER — Ambulatory Visit (INDEPENDENT_AMBULATORY_CARE_PROVIDER_SITE_OTHER): Payer: Medicaid Other | Admitting: Obstetrics and Gynecology

## 2015-06-22 ENCOUNTER — Encounter: Payer: Self-pay | Admitting: Obstetrics and Gynecology

## 2015-06-22 DIAGNOSIS — Z01419 Encounter for gynecological examination (general) (routine) without abnormal findings: Secondary | ICD-10-CM | POA: Insufficient documentation

## 2015-06-22 DIAGNOSIS — Z1151 Encounter for screening for human papillomavirus (HPV): Secondary | ICD-10-CM | POA: Diagnosis present

## 2015-06-22 DIAGNOSIS — Z113 Encounter for screening for infections with a predominantly sexual mode of transmission: Secondary | ICD-10-CM

## 2015-06-22 DIAGNOSIS — Z118 Encounter for screening for other infectious and parasitic diseases: Secondary | ICD-10-CM

## 2015-06-22 DIAGNOSIS — Z124 Encounter for screening for malignant neoplasm of cervix: Secondary | ICD-10-CM

## 2015-06-22 LAB — POCT PREGNANCY, URINE: Preg Test, Ur: NEGATIVE

## 2015-06-22 NOTE — Progress Notes (Signed)
  Subjective:     Lisa Crosby is a 34 y.o. female who presents for a postpartum visit. She is 6 weeks postpartum following a spontaneous vaginal delivery. I have fully reviewed the prenatal and intrapartum course. The delivery was at 37.6 gestational weeks secondary to PROM. Outcome: spontaneous vaginal delivery. Anesthesia: epidural. Postpartum course has been uncomplicated. Baby's course has been uncomplicated. Baby is feeding by breast. Bleeding no bleeding. Bowel function is normal. Bladder function is normal. Patient is sexually active one week ago. Contraception method is none. Postpartum depression screening: negative.     Review of Systems A comprehensive review of systems was negative.   Objective:    BP 143/105 mmHg  Pulse 95  Temp(Src) 99.1 F (37.3 C) (Oral)  Resp 18  SpO2 95%  General:  alert, cooperative and no distress   Breasts:  inspection negative, no nipple discharge or bleeding, no masses or nodularity palpable  Lungs: clear to auscultation bilaterally  Heart:  regular rate and rhythm  Abdomen: soft, non-tender; bowel sounds normal; no masses,  no organomegaly   Vulva:  not evaluated  Vagina: normal vagina, no discharge, exudate, lesion, or erythema  Cervix:  multiparous appearance  Corpus: normal size, contour, position, consistency, mobility, non-tender  Adnexa:  normal adnexa and no mass, fullness, tenderness  Rectal Exam: Not performed.        Assessment:     Normal postpartum exam. Pap smear done at today's visit.   Plan:    1. Contraception: condoms and patient will return in 2 weeks for pregnancy test and nexplanon insertion  2. Patient has not been taking her antihypertensive. Advised to resume her medications 3. Follow up in: 2 weeks for Nexplanon or as needed.

## 2015-06-23 ENCOUNTER — Ambulatory Visit: Payer: Self-pay | Admitting: Medical

## 2015-06-23 LAB — RPR

## 2015-06-23 LAB — HEPATITIS C ANTIBODY: HCV Ab: NEGATIVE

## 2015-06-23 LAB — HIV ANTIBODY (ROUTINE TESTING W REFLEX): HIV 1&2 Ab, 4th Generation: NONREACTIVE

## 2015-06-23 LAB — HEPATITIS B SURFACE ANTIGEN: Hepatitis B Surface Ag: NEGATIVE

## 2015-06-27 LAB — CYTOLOGY - PAP

## 2015-07-05 ENCOUNTER — Ambulatory Visit: Payer: Medicaid Other | Admitting: Family Medicine

## 2015-07-05 ENCOUNTER — Telehealth: Payer: Self-pay | Admitting: *Deleted

## 2015-07-05 ENCOUNTER — Telehealth: Payer: Self-pay | Admitting: Family Medicine

## 2015-07-05 NOTE — Telephone Encounter (Addendum)
Pt left message requesting STD results. Called pt and informed her of all STD test results and pap.  Pt voiced understanding and asked what she can do about her hemorrhoids. She was prescribed medication by her doctor and has been using for 2 days with no results. Pt stated she is going to go to the ED. I advised pt to call her doctor's office before going to the ED. She may be able to be seen at the office or be offered alternative advice. Pt voiced understanding.

## 2015-07-05 NOTE — Telephone Encounter (Signed)
Returned call to patient regarding her hemorrhoids.  Pt stated she has one about the size of  Her pinky finger.  It is very painful and she can't push it back in.  Pt requested something for pain and a stronger cream.  Per Dr. Gwendolyn Grant, patient should be seen in the urgent care for an evaluation.  The cream may not work if patient is stating she can't push it back in.  Patient stated understanding.  Clovis Pu, RN

## 2015-07-05 NOTE — Telephone Encounter (Signed)
Would like to speak to a nurse about medications for hemorrhoids.

## 2015-07-06 ENCOUNTER — Ambulatory Visit (INDEPENDENT_AMBULATORY_CARE_PROVIDER_SITE_OTHER): Payer: Medicaid Other | Admitting: Family Medicine

## 2015-07-06 ENCOUNTER — Encounter: Payer: Self-pay | Admitting: Family Medicine

## 2015-07-06 VITALS — BP 132/84 | HR 74 | Wt 208.0 lb

## 2015-07-06 DIAGNOSIS — K649 Unspecified hemorrhoids: Secondary | ICD-10-CM

## 2015-07-06 DIAGNOSIS — K644 Residual hemorrhoidal skin tags: Secondary | ICD-10-CM | POA: Insufficient documentation

## 2015-07-06 HISTORY — DX: Residual hemorrhoidal skin tags: K64.4

## 2015-07-06 MED ORDER — HYDROCODONE-ACETAMINOPHEN 5-325 MG PO TABS: 1.0000 | ORAL_TABLET | Freq: Four times a day (QID) | ORAL | Status: DC | PRN

## 2015-07-06 MED ORDER — HYDROCORTISONE 2.5 % RE CREA: 1.0000 "application " | TOPICAL_CREAM | Freq: Two times a day (BID) | RECTAL | Status: DC

## 2015-07-06 NOTE — Progress Notes (Signed)
   Subjective:    Patient ID: Lisa Crosby, female    DOB: May 06, 1981, 35 y.o.   MRN: [id number]  Seen for Same day visit for   CC: Hemorrhoids  Patient comes in today reporting hemorrhoids for the past week.  She has tried over-the-counter product to phone with minimal relief.  Reports 8 out of 10 pain the majority of the day, unless she is taking a bath.  Denies any constipation.  Denies rectal bleeding.  Denies previous problems with hemorrhoids.  Not taking any opiates.   Review of Systems   See HPI for ROS. Objective:  BP 132/84 mmHg  Pulse 74  Wt 208 lb (94.348 kg)  Breastfeeding? No  General: NAD Rectum: Large 2cmx1cm nonthrombosed external hemorrhoid    Assessment & Plan:  See Problem List Documentation

## 2015-07-06 NOTE — Assessment & Plan Note (Signed)
Nonthrombosed external hemorrhoid See AVS for symptomatically treatment with hydration, sitz baths, fiber - Norco, #20 given for severe pain  - Anusol twice a day 1 week - Referred to general surgery; she may cancel appointment if hemorrhoid resolves with conservative treatment

## 2015-07-06 NOTE — Patient Instructions (Signed)
I have referred you to general surgery for treatment of your hemorrhoids. You can cancel this appointment if it resolves with the below treatment.   Hemorrhoids Hemorrhoids are swollen veins around the rectum or anus. There are two types of hemorrhoids:   Internal hemorrhoids. These occur in the veins just inside the rectum. They may poke through to the outside and become irritated and painful.  External hemorrhoids. These occur in the veins outside the anus and can be felt as a painful swelling or hard lump near the anus. CAUSES  Pregnancy.   Obesity.   Constipation or diarrhea.   Straining to have a bowel movement.   Sitting for long periods on the toilet.  Heavy lifting or other activity that caused you to strain.  Anal intercourse. SYMPTOMS   Pain.   Anal itching or irritation.   Rectal bleeding.   Fecal leakage.   Anal swelling.   One or more lumps around the anus.  DIAGNOSIS  Your caregiver may be able to diagnose hemorrhoids by visual examination. Other examinations or tests that may be performed include:   Examination of the rectal area with a gloved hand (digital rectal exam).   Examination of anal canal using a small tube (scope).   A blood test if you have lost a significant amount of blood.  A test to look inside the colon (sigmoidoscopy or colonoscopy). TREATMENT Most hemorrhoids can be treated at home. However, if symptoms do not seem to be getting better or if you have a lot of rectal bleeding, your caregiver may perform a procedure to help make the hemorrhoids get smaller or remove them completely. Possible treatments include:   Placing a rubber band at the base of the hemorrhoid to cut off the circulation (rubber band ligation).   Injecting a chemical to shrink the hemorrhoid (sclerotherapy).   Using a tool to burn the hemorrhoid (infrared light therapy).   Surgically removing the hemorrhoid (hemorrhoidectomy).   Stapling the  hemorrhoid to block blood flow to the tissue (hemorrhoid stapling).  HOME CARE INSTRUCTIONS   Eat foods with fiber, such as whole grains, beans, nuts, fruits, and vegetables. Ask your doctor about taking products with added fiber in them (fibersupplements).  Increase fluid intake. Drink enough water and fluids to keep your urine clear or pale yellow.   Exercise regularly.   Go to the bathroom when you have the urge to have a bowel movement. Do not wait.   Avoid straining to have bowel movements.   Keep the anal area dry and clean. Use wet toilet paper or moist towelettes after a bowel movement.   Medicated creams and suppositories may be used or applied as directed.   Only take over-the-counter or prescription medicines as directed by your caregiver.   Take warm sitz baths for 15-20 minutes, 3-4 times a day to ease pain and discomfort.   Place ice packs on the hemorrhoids if they are tender and swollen. Using ice packs between sitz baths may be helpful.   Put ice in a plastic bag.   Place a towel between your skin and the bag.   Leave the ice on for 15-20 minutes, 3-4 times a day.   Do not use a donut-shaped pillow or sit on the toilet for long periods. This increases blood pooling and pain.  SEEK MEDICAL CARE IF:  You have increasing pain and swelling that is not controlled by treatment or medicine.  You have uncontrolled bleeding.  You have difficulty or you  are unable to have a bowel movement.  You have pain or inflammation outside the area of the hemorrhoids. MAKE SURE YOU:  Understand these instructions.  Will watch your condition.  Will get help right away if you are not doing well or get worse. Document Released: 11/22/2000 Document Revised: 11/11/2012 Document Reviewed: 09/29/2012 [facility] Patient Information 2015 [place], [place]. This information is not intended to replace advice given to you by your health care provider. Make sure you discuss  any questions you have with your health care provider.

## 2015-07-07 ENCOUNTER — Ambulatory Visit: Payer: Medicaid Other | Admitting: Obstetrics & Gynecology

## 2015-07-07 ENCOUNTER — Telehealth: Payer: Self-pay | Admitting: Family Medicine

## 2015-07-07 NOTE — Telephone Encounter (Signed)
Pt want a call back to discuss this problem.  Patient is irate over this.

## 2015-07-07 NOTE — Telephone Encounter (Signed)
It looks like she was just seen yesterday.  We won't be able to prescribe her something else without being seen.  Recommend if it's bad enough that Norco or Anusol is not helping, she should be seen in ED.

## 2015-07-07 NOTE — Telephone Encounter (Signed)
Patient need another med for her hemorrhoids.   Previous rx not workign.  Condition getting worse.

## 2015-07-10 NOTE — Telephone Encounter (Signed)
I am covering for Dr. Gwendolyn Grant who is away from the office.  Called patient to follow up on this. She states hemorrhoids are now doing well and had no requests.  Latrelle Dodrill, MD

## 2015-07-13 ENCOUNTER — Ambulatory Visit: Payer: Medicaid Other | Admitting: Obstetrics and Gynecology

## 2015-08-07 ENCOUNTER — Ambulatory Visit: Payer: Medicaid Other | Admitting: Family Medicine

## 2015-09-06 ENCOUNTER — Encounter: Payer: Medicaid Other | Admitting: Internal Medicine

## 2015-09-28 ENCOUNTER — Ambulatory Visit: Payer: Medicaid Other

## 2015-10-05 ENCOUNTER — Ambulatory Visit: Payer: Medicaid Other

## 2015-11-08 ENCOUNTER — Telehealth: Payer: Self-pay | Admitting: Family Medicine

## 2015-11-08 NOTE — Telephone Encounter (Signed)
[place] in [place] helping mom. She is having anxiety attacks.  She has been to the hospital on 2 occasions for these attacks. She will be staying in [place] for a while She needs something "fast" Please advise

## 2015-11-09 MED ORDER — ESCITALOPRAM OXALATE 10 MG PO TABS: 10.0000 mg | ORAL_TABLET | Freq: Every day | ORAL | Status: DC

## 2015-11-09 NOTE — Telephone Encounter (Signed)
She was previously on Lexapro.  I can prescribe this, but would need to know what pharmacy to send it to.  The best option for her is to be seen by a physician there to make sure it's anxiety and not something else, and to have rapid treatment options there.

## 2015-11-09 NOTE — Telephone Encounter (Signed)
Contacted pt to inform her of below, she stated that she had gone to ED and didn't think there any concerns health wise she stated her BP was 121/89. She said you could [facility] the [facility] [place], [place].  The address is [address] [place], [place] [id number], the phone number is [phone].  Pt said thank you from the bottom of her heart and stated that she would be back in December and would then schedule an appointment. Please advise if I need to do anything else. Lamonte SakaiZimmerman Rumple, [name], [place]

## 2015-11-09 NOTE — Telephone Encounter (Signed)
RN staff - I am covering for Dr. Gwendolyn GrantWalden, please call in Lexapro 10 mg PO daily, call into the pharmacy listed in nursing note, dispense #30, refill #0, schedule follow up, thanks

## 2015-11-10 NOTE — Telephone Encounter (Signed)
Contacted pharmacy and called in below information, pt notified. Lisa Crosby, [name], [place]

## 2015-11-20 IMAGING — CR DG CHEST 2V
2 series · 2 of 2 positions shown · non-contrast
Comparison: 11/02/2013

CLINICAL DATA: Body aches, chest discomfort, history hypertension,
smoking

EXAM:
CHEST  2 VIEW

[w chest lat]
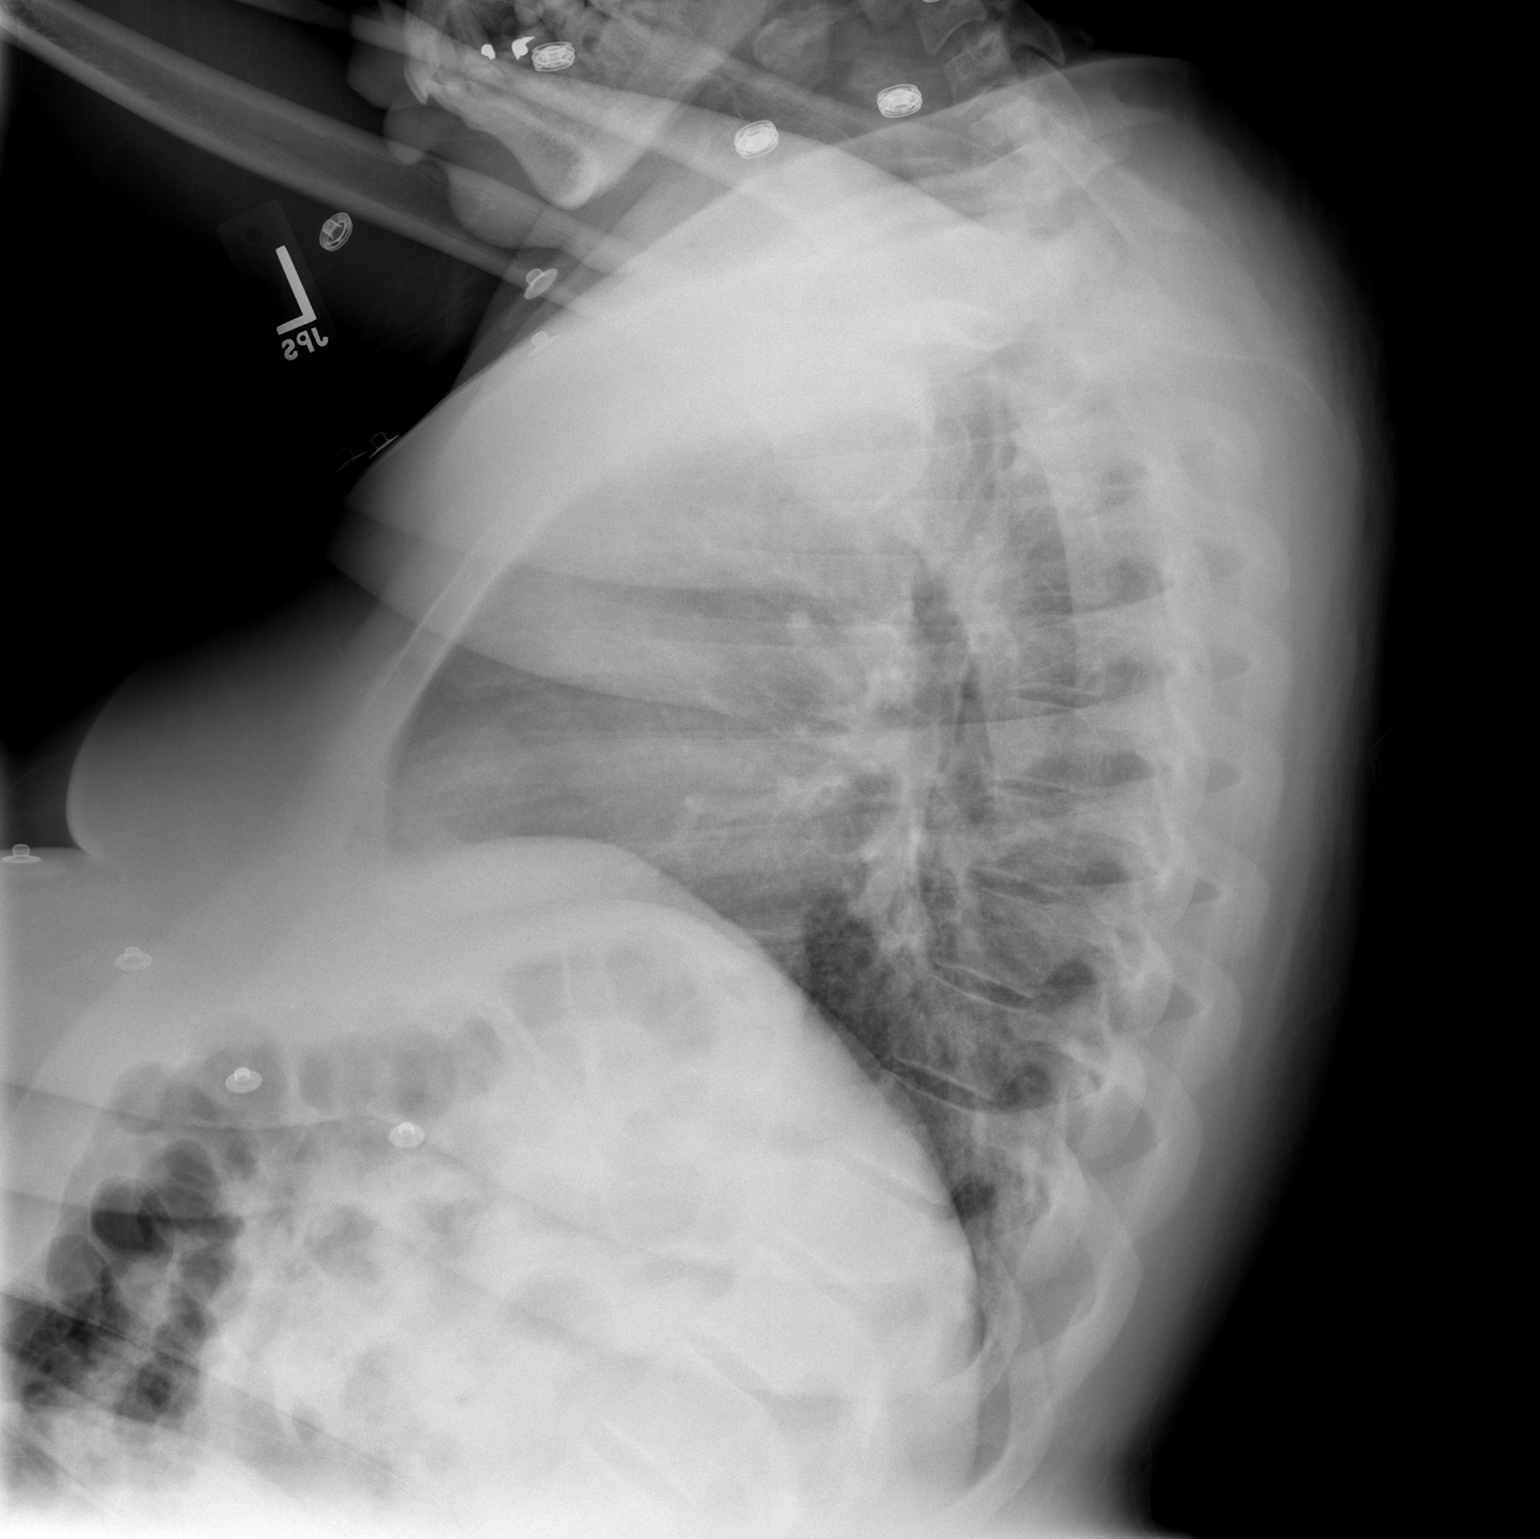

[w chest ap]
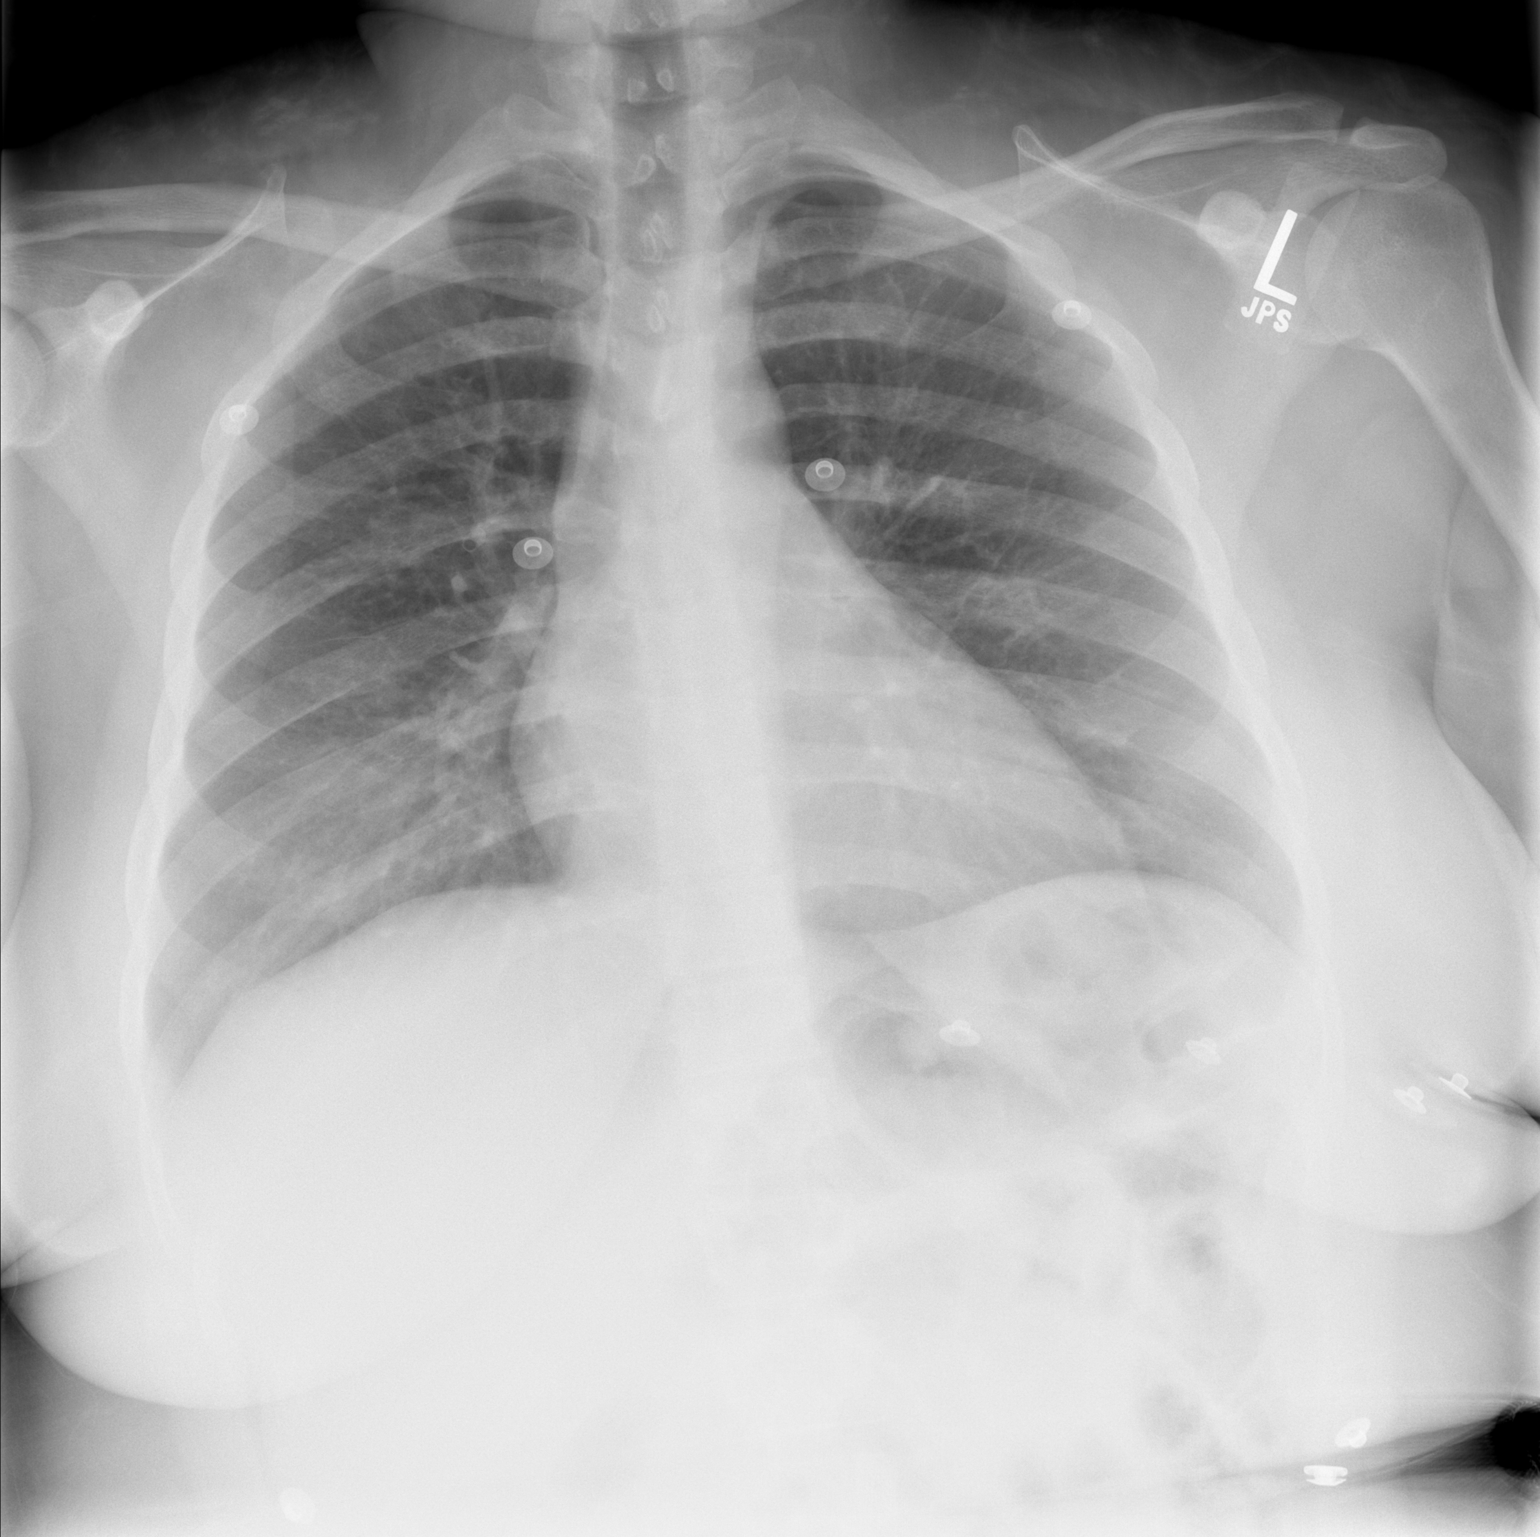

[2 of 2 positions shown; findings below may reference images not displayed]

FINDINGS: Upper normal heart size.

Normal mediastinal contours and pulmonary vascularity.

Minimal peribronchial thickening.

Lungs clear.

No pleural effusion or pneumothorax.

Osseous structures unremarkable.
IMPRESSION: Minimal bronchitic changes without infiltrate.

## 2015-12-05 ENCOUNTER — Other Ambulatory Visit: Payer: Self-pay | Admitting: Family Medicine

## 2015-12-05 DIAGNOSIS — O0990 Supervision of high risk pregnancy, unspecified, unspecified trimester: Secondary | ICD-10-CM

## 2015-12-05 NOTE — Telephone Encounter (Signed)
Patient asks refills for hydrochorothiazide 25 mg and escitalopram 10 mg sent to [facility] on [address]. Please, follow up with Patient.

## 2015-12-06 MED ORDER — ESCITALOPRAM OXALATE 10 MG PO TABS: 10.0000 mg | ORAL_TABLET | Freq: Every day | ORAL | Status: DC

## 2015-12-06 MED ORDER — HYDROCHLOROTHIAZIDE 25 MG PO TABS: 25.0000 mg | ORAL_TABLET | Freq: Every day | ORAL | Status: DC

## 2015-12-19 ENCOUNTER — Encounter: Payer: Medicaid Other | Admitting: Family Medicine

## 2015-12-29 ENCOUNTER — Ambulatory Visit (INDEPENDENT_AMBULATORY_CARE_PROVIDER_SITE_OTHER): Payer: Medicaid Other | Admitting: Family Medicine

## 2015-12-29 ENCOUNTER — Encounter: Payer: Medicaid Other | Admitting: Family Medicine

## 2015-12-29 ENCOUNTER — Encounter: Payer: Self-pay | Admitting: Family Medicine

## 2015-12-29 VITALS — BP 125/84 | HR 85 | Temp 98.4°F | Wt 220.3 lb

## 2015-12-29 DIAGNOSIS — K0889 Other specified disorders of teeth and supporting structures: Secondary | ICD-10-CM | POA: Diagnosis present

## 2015-12-29 DIAGNOSIS — F411 Generalized anxiety disorder: Secondary | ICD-10-CM | POA: Diagnosis not present

## 2015-12-29 DIAGNOSIS — I10 Essential (primary) hypertension: Secondary | ICD-10-CM

## 2015-12-29 DIAGNOSIS — Z113 Encounter for screening for infections with a predominantly sexual mode of transmission: Secondary | ICD-10-CM | POA: Insufficient documentation

## 2015-12-29 DIAGNOSIS — Z309 Encounter for contraceptive management, unspecified: Secondary | ICD-10-CM

## 2015-12-29 DIAGNOSIS — N898 Other specified noninflammatory disorders of vagina: Secondary | ICD-10-CM

## 2015-12-29 DIAGNOSIS — Z3009 Encounter for other general counseling and advice on contraception: Secondary | ICD-10-CM

## 2015-12-29 MED ORDER — HYDROCODONE-ACETAMINOPHEN 10-325 MG PO TABS: 1.0000 | ORAL_TABLET | Freq: Three times a day (TID) | ORAL | Status: DC | PRN

## 2015-12-29 MED ORDER — CITALOPRAM HYDROBROMIDE 10 MG PO TABS: 10.0000 mg | ORAL_TABLET | Freq: Every day | ORAL | Status: DC

## 2015-12-29 MED ORDER — CLINDAMYCIN HCL 300 MG PO CAPS: 300.0000 mg | ORAL_CAPSULE | Freq: Three times a day (TID) | ORAL | Status: DC

## 2015-12-29 MED ORDER — AMLODIPINE BESYLATE 10 MG PO TABS: 10.0000 mg | ORAL_TABLET | Freq: Every day | ORAL | Status: DC

## 2015-12-29 NOTE — Assessment & Plan Note (Signed)
Will start amlodipine today as this appears well controlled today. (Of note patient took her mother's prescription for amlodipine today.)

## 2015-12-29 NOTE — Assessment & Plan Note (Signed)
Discussed options for birth control. Patient wants nexplanon. Will schedule an appointment next week for placement.

## 2015-12-29 NOTE — Patient Instructions (Signed)
Please schedule an appointment next week for your nexplanon insertion.  It will take a few days for your tests today to come back.  We will give you antibiotics and pain medications for your tooth pain. Please see the dentist as soon as possible.  We will start celexa for your anxiety. We are starting at a low dose to minimize side effects. Please come back in a few weeks for follow up.  We will also start amlodipine today for your blood pressure.  Take care,  Dr Jimmey Ralph

## 2015-12-29 NOTE — Assessment & Plan Note (Signed)
Will start low dose of celexa today. Could consider paxil instead, however given side effects with higher doses of lexapro instead decided to proceed with celexa. Will follow up with PCP for further titration of SSRIs. Patient declined referral to psychologist.
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
## 2015-12-29 NOTE — Addendum Note (Signed)
Addended by: Ardith Dark on: 12/29/2015 07:06 PM   Modules accepted: Kipp Brood

## 2015-12-30 LAB — HIV ANTIBODY (ROUTINE TESTING W REFLEX): HIV 1&2 Ab, 4th Generation: NONREACTIVE

## 2015-12-30 LAB — RPR

## 2016-01-01 ENCOUNTER — Encounter: Payer: Self-pay | Admitting: Family Medicine

## 2016-01-01 ENCOUNTER — Other Ambulatory Visit: Payer: Self-pay | Admitting: Family Medicine

## 2016-01-01 NOTE — Telephone Encounter (Signed)
Wants prescription for some nicotine patches.  The pills she was given for anxiety last week arent working. They make her heart race.  She didn't like the way they made her feel.  Could she get something else?

## 2016-01-02 MED ORDER — NICOTINE 21 MG/24HR TD PT24: 21.0000 mg | MEDICATED_PATCH | Freq: Every day | TRANSDERMAL | Status: DC

## 2016-01-02 NOTE — Telephone Encounter (Signed)
Spoke to pt, understood Rx for nicotine patches has been sent but may not be covered. She will make an appointment to meet and discuss anxiety medicine. Sunday Spillers, CMA

## 2016-01-02 NOTE — Telephone Encounter (Signed)
I have sent in nicotine patches for her, though I'm not sure they'll be covered as they're over the counter.  To discuss the anxiety medicine, she needs to return. We'd need more details as to what she means by how they made her feel.  She was on her this medication previously and tolerated it.

## 2016-01-09 ENCOUNTER — Ambulatory Visit: Payer: Medicaid Other | Admitting: Family Medicine

## 2016-01-12 ENCOUNTER — Ambulatory Visit: Payer: Medicaid Other | Admitting: Family Medicine

## 2016-02-04 ENCOUNTER — Emergency Department (HOSPITAL_COMMUNITY)
Admission: EM | Admit: 2016-02-04 | Discharge: 2016-02-04 | Disposition: A | Discharge status: Home / Self Care | Payer: Medicaid Other | Attending: Emergency Medicine | Admitting: Emergency Medicine

## 2016-02-04 ENCOUNTER — Emergency Department (HOSPITAL_COMMUNITY): Payer: Medicaid Other

## 2016-02-04 ENCOUNTER — Encounter (HOSPITAL_COMMUNITY): Payer: Self-pay | Admitting: Emergency Medicine

## 2016-02-04 DIAGNOSIS — R062 Wheezing: Secondary | ICD-10-CM | POA: Insufficient documentation

## 2016-02-04 DIAGNOSIS — Z79899 Other long term (current) drug therapy: Secondary | ICD-10-CM | POA: Insufficient documentation

## 2016-02-04 DIAGNOSIS — Z87440 Personal history of urinary (tract) infections: Secondary | ICD-10-CM | POA: Insufficient documentation

## 2016-02-04 DIAGNOSIS — Z8619 Personal history of other infectious and parasitic diseases: Secondary | ICD-10-CM | POA: Diagnosis not present

## 2016-02-04 DIAGNOSIS — Z87891 Personal history of nicotine dependence: Secondary | ICD-10-CM | POA: Diagnosis not present

## 2016-02-04 DIAGNOSIS — R0789 Other chest pain: Secondary | ICD-10-CM | POA: Diagnosis not present

## 2016-02-04 DIAGNOSIS — F419 Anxiety disorder, unspecified: Secondary | ICD-10-CM | POA: Insufficient documentation

## 2016-02-04 DIAGNOSIS — R079 Chest pain, unspecified: Secondary | ICD-10-CM

## 2016-02-04 DIAGNOSIS — E669 Obesity, unspecified: Secondary | ICD-10-CM | POA: Insufficient documentation

## 2016-02-04 DIAGNOSIS — Z872 Personal history of diseases of the skin and subcutaneous tissue: Secondary | ICD-10-CM | POA: Insufficient documentation

## 2016-02-04 DIAGNOSIS — M546 Pain in thoracic spine: Secondary | ICD-10-CM | POA: Insufficient documentation

## 2016-02-04 DIAGNOSIS — Z87448 Personal history of other diseases of urinary system: Secondary | ICD-10-CM | POA: Insufficient documentation

## 2016-02-04 DIAGNOSIS — Z8742 Personal history of other diseases of the female genital tract: Secondary | ICD-10-CM | POA: Insufficient documentation

## 2016-02-04 DIAGNOSIS — I10 Essential (primary) hypertension: Secondary | ICD-10-CM | POA: Insufficient documentation

## 2016-02-04 LAB — BASIC METABOLIC PANEL
ANION GAP: 11 (ref 5–15)
BUN: 9 mg/dL (ref 6–20)
CALCIUM: 9.3 mg/dL (ref 8.9–10.3)
CHLORIDE: 105 mmol/L (ref 101–111)
CO2: 22 mmol/L (ref 22–32)
Creatinine, Ser: 1.08 mg/dL — ABNORMAL HIGH (ref 0.44–1.00)
GFR calc Af Amer: >60 mL/min (ref >60)
GLUCOSE: 99 mg/dL (ref 65–99)
POTASSIUM: 3.9 mmol/L (ref 3.5–5.1)
Sodium: 138 mmol/L (ref 135–145)

## 2016-02-04 LAB — CBC
HCT: 40 % (ref 36.0–46.0)
HEMOGLOBIN: 13.3 g/dL (ref 12.0–15.0)
MCH: 28.9 pg (ref 26.0–34.0)
MCHC: 33.3 g/dL (ref 30.0–36.0)
MCV: 86.8 fL (ref 78.0–100.0)
PLATELETS: 348 10*3/uL (ref 150–400)
RBC: 4.61 MIL/uL (ref 3.87–5.11)
RDW: 13.4 % (ref 11.5–15.5)
WBC: 7.2 10*3/uL (ref 4.0–10.5)

## 2016-02-04 LAB — I-STAT TROPONIN, ED: TROPONIN I, POC: 0 ng/mL (ref 0.00–0.08)

## 2016-02-04 MED ORDER — ALBUTEROL SULFATE HFA 108 (90 BASE) MCG/ACT IN AERS: 2.0000 | INHALATION_SPRAY | Freq: Once | RESPIRATORY_TRACT | Status: DC

## 2016-02-04 MED ORDER — ALBUTEROL SULFATE HFA 108 (90 BASE) MCG/ACT IN AERS
2.0000 | INHALATION_SPRAY | Freq: Once | RESPIRATORY_TRACT | Status: AC
  Administered 2016-02-04: 2 via RESPIRATORY_TRACT
  Filled 2016-02-04: qty 6.7

## 2016-02-04 MED ORDER — AEROCHAMBER PLUS W/MASK MISC
1.0000 | Freq: Once | Status: AC
  Administered 2016-02-04: 1
  Filled 2016-02-04: qty 1

## 2016-02-04 NOTE — ED Notes (Signed)
Pt from home for eval of left sided cp with radiation to back and fingers, pt also reports some sob when pain starts. Pt states hx of anxiety but no longer takes meds, pt in nad in triage. Pt also reports some diarrhea x2 days.

## 2016-02-04 NOTE — ED Notes (Signed)
Pt departed in NAD.  

## 2016-02-04 NOTE — Discharge Instructions (Signed)
Take 4 over the counter ibuprofen tablets 3 times a day or 2 over-the-counter naproxen tablets twice a day for pain. ° °Chest Wall Pain °Chest wall pain is pain in or around the bones and muscles of your chest. Sometimes, an injury causes this pain. Sometimes, the cause may not be known. This pain may take several weeks or longer to get better. °HOME CARE °Pay attention to any changes in your symptoms. Take these actions to help with your pain: °· Rest as told by your doctor. °· Avoid activities that cause pain. Try not to use your chest, belly (abdominal), or side muscles to lift heavy things. °· If directed, apply ice to the painful area: °¨ Put ice in a plastic bag. °¨ Place a towel between your skin and the bag. °¨ Leave the ice on for 20 minutes, 2-3 times per day. °· Take over-the-counter and prescription medicines only as told by your doctor. °· Do not use tobacco products, including cigarettes, chewing tobacco, and e-cigarettes. If you need help quitting, ask your doctor. °· Keep all follow-up visits as told by your doctor. This is important. °GET HELP IF: °· You have a fever. °· Your chest pain gets worse. °· You have new symptoms. °GET HELP RIGHT AWAY IF: °· You feel sick to your stomach (nauseous) or you throw up (vomit). °· You feel sweaty or light-headed. °· You have a cough with phlegm (sputum) or you cough up blood. °· You are short of breath. °  °This information is not intended to replace advice given to you by your health care provider. Make sure you discuss any questions you have with your health care provider. °  °Document Released: 05/13/2008 Document Revised: 08/16/2015 Document Reviewed: 02/20/2015 °Elsevier Interactive Patient Education ©2016 Elsevier Inc. ° °
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
## 2016-02-09 ENCOUNTER — Ambulatory Visit: Payer: Medicaid Other | Admitting: Family Medicine

## 2016-02-14 ENCOUNTER — Other Ambulatory Visit: Payer: Self-pay | Admitting: Family Medicine

## 2016-02-27 ENCOUNTER — Encounter: Payer: Medicaid Other | Admitting: Family Medicine

## 2016-02-29 ENCOUNTER — Ambulatory Visit: Payer: Medicaid Other | Admitting: Family Medicine

## 2016-03-21 ENCOUNTER — Ambulatory Visit (INDEPENDENT_AMBULATORY_CARE_PROVIDER_SITE_OTHER): Payer: Medicaid Other | Admitting: Family Medicine

## 2016-03-21 ENCOUNTER — Encounter: Payer: Self-pay | Admitting: Family Medicine

## 2016-03-21 VITALS — BP 138/101 | HR 76 | Temp 98.3°F | Ht 64.0 in | Wt 216.1 lb

## 2016-03-21 DIAGNOSIS — F411 Generalized anxiety disorder: Secondary | ICD-10-CM

## 2016-03-21 DIAGNOSIS — K0889 Other specified disorders of teeth and supporting structures: Secondary | ICD-10-CM | POA: Diagnosis not present

## 2016-03-21 MED ORDER — LORAZEPAM 0.5 MG PO TABS: 0.5000 mg | ORAL_TABLET | Freq: Every day | ORAL | Status: DC | PRN

## 2016-03-21 MED ORDER — IBUPROFEN 800 MG PO TABS: 800.0000 mg | ORAL_TABLET | Freq: Three times a day (TID) | ORAL | Status: DC | PRN

## 2016-03-21 MED ORDER — BUSPIRONE HCL 7.5 MG PO TABS: 7.5000 mg | ORAL_TABLET | Freq: Two times a day (BID) | ORAL | Status: DC

## 2016-03-21 NOTE — Progress Notes (Signed)
    Subjective   Lisa Crosby is a 35 y.o. female that presents for a same day visit  1. Anxiety: This is a chronic issue but she has been having an acute issue with this problem. She feels like world events have been causing her stress in addition to certain personal situations. She has started a new job which has caused her anxiety secondary meeting new people. She has associated trouble sleeping secondary to anxious thoughts. She currently smokes about 1 pack of cigarettes per day. She has been drinking daily to cope with her anxiety. She was previously on Celexa 10mg  daily which did not help. Atarax has helped slightly. She got Ativan while out of town which helped significantly.  2. Mouth pain: she noticed her pain in her mouth for the last few months. No fevers, nausea or vomiting. She just recently got insurance and plans to get her teeth removed at the end of the month.   ROS Per HPI  Social History  Substance Use Topics  . Smoking status: Former Smoker -- 0.25 packs/day  . Smokeless tobacco: Never Used  . Alcohol Use: 0.6 oz/week    1 Glasses of wine per week     Comment: not with pregnancy    Allergies  Allergen Reactions  . Shellfish Allergy Hives    Patient was pregnant at the time    Objective   BP 138/101 mmHg  Pulse 76  Temp(Src) 98.3 F (36.8 C) (Oral)  Ht 5\' 4"  (1.626 m)  Wt 216 lb 1.6 oz (98.022 kg)  BMI 37.08 kg/m2  LMP 03/14/2016  General: Well appearing, no distress HEENT:   Head:  Normocephalic  Eyes: Pupils equal and reactive to light/accomodation. Extraocular movements intact bilaterally.  Ears: Tympanic membranes normal bilaterally.  Nose/Throat: Nares patent bilaterally. Oropharnx clear and moist. Multiple broken teeth on left lower side of mouth. No abscesses  Neck: No cervical adenopathy bilaterally  Assessment and Plan   1. Generalized anxiety disorder Discussed that this is something that needs to be treated chronically. Since patient  appears to be having significant distress from symptoms, will prescribe short course of Ativan and start her on Buspar. Patient promised that she would not use alcohol and benzo together. Follow-up in two weeks with PCP - LORazepam (ATIVAN) 0.5 MG tablet; Take 1 tablet (0.5 mg total) by mouth daily as needed for anxiety.  Dispense: 30 tablet; Refill: 0 - busPIRone (BUSPAR) 7.5 MG tablet; Take 1 tablet (7.5 mg total) by mouth 2 (two) times daily.  Dispense: 60 tablet; Refill: 1  2. Pain, dental Follow-up with dentist. Will treat pain. - ibuprofen (ADVIL,MOTRIN) 800 MG tablet; Take 1 tablet (800 mg total) by mouth every 8 (eight) hours as needed.  Dispense: 30 tablet; Refill: 0

## 2016-03-21 NOTE — Patient Instructions (Addendum)
Thank you for coming to see me today. It was a pleasure. Today we talked about:   Anxiety: I will start you on Buspar 7.5mg  twice daily. Take this every day. I will give you a 30 day supply of Ativan to help with acute events as needed daily. As discussed during our visit, you have promised me that you will not drink alcohol while taking these medications as this can have very bad effects on you (I would honestly hate for that to happen).  Dental pain: I will prescribe ibuprofen 800mg  up to three times daily as needed.  Please make an appointment to see Dr. Gwendolyn GrantWalden in two weeks for follow-up of your anxiety.  If you have any questions or concerns, please do not hesitate to call the office at [phone].  Sincerely,  Lisa Hawkingalph Nichole Neyer, MD

## 2016-04-24 ENCOUNTER — Other Ambulatory Visit: Payer: Self-pay | Admitting: *Deleted

## 2016-04-24 MED ORDER — AMLODIPINE BESYLATE 10 MG PO TABS: 10.0000 mg | ORAL_TABLET | Freq: Every day | ORAL | Status: DC

## 2016-04-26 ENCOUNTER — Ambulatory Visit: Payer: Medicaid Other | Admitting: Family Medicine

## 2016-04-26 ENCOUNTER — Other Ambulatory Visit: Payer: Self-pay | Admitting: Family Medicine

## 2016-04-26 DIAGNOSIS — F411 Generalized anxiety disorder: Secondary | ICD-10-CM

## 2016-04-26 MED ORDER — CITALOPRAM HYDROBROMIDE 10 MG PO TABS: ORAL_TABLET | ORAL | Status: DC

## 2016-04-26 MED ORDER — LORAZEPAM 0.5 MG PO TABS: 0.5000 mg | ORAL_TABLET | Freq: Every day | ORAL | Status: DC | PRN

## 2016-04-26 NOTE — Telephone Encounter (Signed)
Patient missed bus and would not make it to her 10 AM appt on time. Dr. Gwendolyn GrantWalden not able to see her later. Patient needs refills on Ativan and Celexa.

## 2016-05-04 ENCOUNTER — Telehealth: Payer: Self-pay | Admitting: Family Medicine

## 2016-05-04 NOTE — Telephone Encounter (Signed)
Pt. Called after hours line. She says that she has been having a "pulling sensation" in the back of her head. She describes it as a "tension". She says that it has been going on only today. It goes away if she takes ibuprofen. She is wondering if this is something serious and if she needs imaging of her head. I mentioned that I could not examine her. It sounds more like a tension headache / muscle spasm to me. I recommended ibuprofen and follow up in clinic early next week if it persists. She says that this sounds like a good plan.   Devota Pacealeb Derrian Poli, MD Family Medicine -PGY 2

## 2016-06-02 ENCOUNTER — Other Ambulatory Visit: Payer: Self-pay | Admitting: Family Medicine

## 2016-06-03 ENCOUNTER — Telehealth: Payer: Self-pay | Admitting: Family Medicine

## 2016-06-03 NOTE — Telephone Encounter (Signed)
Went to dentist in April. It was recommended she have 9 teeth pulled. Dentist gave antibotic, hydrocodone for the pain. She has started an new job and cannot get off work until 2 weeks from now.  She would like to have some type of pain med to help her make it until the dentist appt.  [facility] [address]

## 2016-06-04 NOTE — Telephone Encounter (Signed)
This was placed in the "to be faxed" file.  But I'm not sure I specified which pharmacy.  If not, would you please call this in?  Thanks!  JW

## 2016-06-05 MED ORDER — TRAMADOL HCL 50 MG PO TABS: 50.0000 mg | ORAL_TABLET | Freq: Three times a day (TID) | ORAL | Status: DC | PRN

## 2016-06-05 NOTE — Telephone Encounter (Signed)
I can feel some Tramadol for her until her appointment.  [facility]

## 2016-06-06 NOTE — Telephone Encounter (Signed)
Done. Sharon T Saunders, CMA  

## 2016-06-24 ENCOUNTER — Other Ambulatory Visit: Payer: Self-pay | Admitting: *Deleted

## 2016-06-24 DIAGNOSIS — F411 Generalized anxiety disorder: Secondary | ICD-10-CM

## 2016-06-24 MED ORDER — BUSPIRONE HCL 7.5 MG PO TABS: 7.5000 mg | ORAL_TABLET | Freq: Two times a day (BID) | ORAL | Status: DC

## 2016-07-09 ENCOUNTER — Ambulatory Visit (INDEPENDENT_AMBULATORY_CARE_PROVIDER_SITE_OTHER): Payer: Medicaid Other | Admitting: Family Medicine

## 2016-07-09 ENCOUNTER — Other Ambulatory Visit (HOSPITAL_COMMUNITY)
Admission: RE | Admit: 2016-07-09 | Discharge: 2016-07-09 | Disposition: A | Discharge status: Home / Self Care | Payer: Medicaid Other | Source: Ambulatory Visit | Attending: Family Medicine | Admitting: Family Medicine

## 2016-07-09 ENCOUNTER — Telehealth: Payer: Self-pay | Admitting: Family Medicine

## 2016-07-09 ENCOUNTER — Encounter: Payer: Self-pay | Admitting: Family Medicine

## 2016-07-09 VITALS — BP 138/90 | HR 88 | Temp 99.6°F | Ht 64.0 in | Wt 217.8 lb

## 2016-07-09 DIAGNOSIS — Z113 Encounter for screening for infections with a predominantly sexual mode of transmission: Secondary | ICD-10-CM | POA: Insufficient documentation

## 2016-07-09 DIAGNOSIS — N898 Other specified noninflammatory disorders of vagina: Secondary | ICD-10-CM | POA: Diagnosis present

## 2016-07-09 LAB — POCT WET PREP (WET MOUNT): Clue Cells Wet Prep Whiff POC: POSITIVE

## 2016-07-09 MED ORDER — NORGESTIMATE-ETH ESTRADIOL 0.25-35 MG-MCG PO TABS: 1.0000 | ORAL_TABLET | Freq: Every day | ORAL | 11 refills | Status: DC

## 2016-07-09 MED ORDER — CITALOPRAM HYDROBROMIDE 10 MG PO TABS: ORAL_TABLET | ORAL | 2 refills | Status: DC

## 2016-07-09 MED ORDER — DICLOFENAC SODIUM 75 MG PO TBEC: 75.0000 mg | DELAYED_RELEASE_TABLET | Freq: Two times a day (BID) | ORAL | 1 refills | Status: DC | PRN

## 2016-07-09 MED ORDER — DESONIDE 0.05 % EX CREA: TOPICAL_CREAM | Freq: Two times a day (BID) | CUTANEOUS | 0 refills | Status: DC

## 2016-07-09 MED ORDER — METRONIDAZOLE 500 MG PO TABS: 1000.0000 mg | ORAL_TABLET | Freq: Two times a day (BID) | ORAL | 0 refills | Status: DC

## 2016-07-09 MED ORDER — AMLODIPINE BESYLATE 10 MG PO TABS: 10.0000 mg | ORAL_TABLET | Freq: Every day | ORAL | 2 refills | Status: DC

## 2016-07-09 MED ORDER — LORAZEPAM 0.5 MG PO TABS: 0.5000 mg | ORAL_TABLET | Freq: Every evening | ORAL | 1 refills | Status: DC | PRN

## 2016-07-09 NOTE — Patient Instructions (Addendum)
It was great to see you today like always.  We are checking you for any sexually transmitted diseases.  Use the Sprintec pills for contraception until your Nexplanon arrives.    We will be to scheduled for a Nexplanon insertion in the next couple weeks.    Use the Desonide on the spots on your arm.

## 2016-07-09 NOTE — Telephone Encounter (Signed)
[facility] for [facility]ic[facility]ac Sodium tablet. Formulary and PA form placed in provider box for completion.  Patient is also requesting test results.  Please give patient a call.   Clovis Pu, RN
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
## 2016-07-09 NOTE — Telephone Encounter (Signed)
Received PA approval for Diclofenac Sodium 75 mg via [place] [employer].  Med approved for 07/09/16 - 09/07/16.  [facility]  PA approval number [id number]. Clovis Pu, RN

## 2016-07-09 NOTE — Telephone Encounter (Signed)
I will complete the prior auth.    Of note, her wet prep results were positive for both bacterial vaginosis and trichomonas.  The treatment is the same for each.  I tried to call and let her know the results of her wet prep however had liver voicemail. I did not give her the results as I do not like to leave this on voicemail. When she calls back please let her know that she is positive for both of the 2 above. I sent in the Flagyl for her to take.

## 2016-07-09 NOTE — Telephone Encounter (Signed)
Pt would like the results of her pap. Pt stated the medication prescribed for pain was denied by medicaid and would like something different to be called in. Please call pt after Rx has been called in. Thanks! ep

## 2016-07-09 NOTE — Telephone Encounter (Signed)
Contacted pt and gave her the information below. Lamonte Sakai, [name], [place]

## 2016-07-10 LAB — CERVICOVAGINAL ANCILLARY ONLY
CHLAMYDIA, DNA PROBE: NEGATIVE
Neisseria Gonorrhea: NEGATIVE

## 2016-07-15 ENCOUNTER — Encounter: Payer: Self-pay | Admitting: Family Medicine

## 2016-07-22 ENCOUNTER — Other Ambulatory Visit: Payer: Self-pay | Admitting: Family Medicine

## 2016-08-12 ENCOUNTER — Other Ambulatory Visit: Payer: Self-pay | Admitting: Family Medicine

## 2016-08-12 DIAGNOSIS — F411 Generalized anxiety disorder: Secondary | ICD-10-CM

## 2016-09-17 ENCOUNTER — Telehealth: Payer: Self-pay | Admitting: Family Medicine

## 2016-09-17 NOTE — Telephone Encounter (Signed)
Needs refill on eczema. Needs more than the little tube. [facility] [address]

## 2016-09-18 MED ORDER — DESONIDE 0.05 % EX CREA: TOPICAL_CREAM | CUTANEOUS | 1 refills | Status: DC

## 2016-10-16 ENCOUNTER — Ambulatory Visit: Payer: Medicaid Other | Admitting: Family Medicine

## 2016-10-17 ENCOUNTER — Ambulatory Visit (INDEPENDENT_AMBULATORY_CARE_PROVIDER_SITE_OTHER): Payer: Medicaid Other | Admitting: Internal Medicine

## 2016-10-17 ENCOUNTER — Encounter: Payer: Self-pay | Admitting: Internal Medicine

## 2016-10-17 VITALS — BP 162/108 | Temp 98.4°F | Wt 221.6 lb

## 2016-10-17 DIAGNOSIS — F411 Generalized anxiety disorder: Secondary | ICD-10-CM | POA: Diagnosis not present

## 2016-10-17 DIAGNOSIS — I10 Essential (primary) hypertension: Secondary | ICD-10-CM | POA: Diagnosis present

## 2016-10-17 DIAGNOSIS — K047 Periapical abscess without sinus: Secondary | ICD-10-CM | POA: Diagnosis not present

## 2016-10-17 MED ORDER — AMOXICILLIN-POT CLAVULANATE 875-125 MG PO TABS: 1.0000 | ORAL_TABLET | Freq: Two times a day (BID) | ORAL | 0 refills | Status: DC

## 2016-10-17 MED ORDER — PAROXETINE HCL 20 MG PO TABS: 20.0000 mg | ORAL_TABLET | Freq: Every day | ORAL | 0 refills | Status: DC

## 2016-10-17 MED ORDER — DESONIDE 0.05 % EX CREA: TOPICAL_CREAM | CUTANEOUS | 1 refills | Status: DC

## 2016-10-17 MED ORDER — LORAZEPAM 0.5 MG PO TABS: 0.5000 mg | ORAL_TABLET | Freq: Every evening | ORAL | 0 refills | Status: DC | PRN

## 2016-10-17 MED ORDER — TRAMADOL HCL 50 MG PO TABS: 50.0000 mg | ORAL_TABLET | Freq: Three times a day (TID) | ORAL | 0 refills | Status: DC | PRN

## 2016-10-17 MED ORDER — AMLODIPINE BESYLATE 10 MG PO TABS: 10.0000 mg | ORAL_TABLET | Freq: Every day | ORAL | 2 refills | Status: DC
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
## 2016-10-17 NOTE — Assessment & Plan Note (Signed)
Elevated in clinic today with repeat still elevated at 162/108. Denies chest pain, HA, blurred vision. Likely due to pain as her blood pressure readings at past visits has been stable.  - continue Norvasc  - will get pain under control - patient to make nurse visit in a few days for blood pressure check - return precautions discussed

## 2016-10-17 NOTE — Assessment & Plan Note (Signed)
Not taking Celexa due to making her sleepy. Still interested in trying another medication. Will start Paxil 20mg  daily. Refilled Ativan daily at bedtime PRN #20 tabs. Interested in seeing behavioral health therapist; not interested in meeting someone during this visit. Contact information given.  - follow up with PCP in 1 week

## 2016-10-17 NOTE — Patient Instructions (Addendum)
For your anxiety, let's try Paxil. Take one tablet daily in the morning. Please follow up in 1 week with Dr. Gwendolyn GrantWalden. Please call the behavioral health consultant to make an appointment.   I prescribed Augmentin (antibiotic) for your tooth infection. You still need to go to the dentist to be evaluated. I prescribed Tramadol that you can take as needed for pain. Please continue to try to call the dentist to see if you can get an appointment sooner. If you start having fevers or vomiting, please seen immediate medical attention.   Please make a follow up visit to get your Nexplanon.   Please make a nurse visit in a few days for blood pressure check.

## 2016-10-24 ENCOUNTER — Ambulatory Visit: Payer: Medicaid Other | Admitting: Internal Medicine

## 2016-11-11 ENCOUNTER — Telehealth: Payer: Self-pay | Admitting: Family Medicine

## 2016-11-11 NOTE — Telephone Encounter (Signed)
Will forward to Dr. Gwendolyn GrantWalden.  Clovis PuMartin, Hobert Poplaski L, RN

## 2016-11-11 NOTE — Telephone Encounter (Signed)
Pt is having dental surgery on Wednesday. Pt is experiencing severe anxiety. The medication pt has is not working, so she isn't taking it. Pt is having major anxiety of upcoming dental surgery. Pt would like something for anxiety just to get through until appointment. Pt would like Dr. Gwendolyn GrantWalden to call her, states he is familiar with her anxiety about dental work. [facility] [address] Please advise. Thanks! ep

## 2016-11-12 MED ORDER — LORAZEPAM 0.5 MG PO TABS
0.5000 mg | ORAL_TABLET | Freq: Every evening | ORAL | 0 refills | Status: DC | PRN
Start: 2016-11-12 — End: 2017-01-07

## 2016-11-12 NOTE — Telephone Encounter (Signed)
Will send in very short course of ativan, which she has taken previously.  Called and left message without details about this.  Will place in to be faxed box.

## 2016-11-15 ENCOUNTER — Encounter: Payer: Self-pay | Admitting: Internal Medicine

## 2016-11-15 ENCOUNTER — Ambulatory Visit (INDEPENDENT_AMBULATORY_CARE_PROVIDER_SITE_OTHER): Payer: Medicaid Other | Admitting: Internal Medicine

## 2016-11-15 VITALS — BP 116/88 | HR 84 | Temp 98.1°F | Ht 64.0 in | Wt 219.2 lb

## 2016-11-15 DIAGNOSIS — Z30430 Encounter for insertion of intrauterine contraceptive device: Secondary | ICD-10-CM | POA: Diagnosis present

## 2016-11-15 DIAGNOSIS — Z3046 Encounter for surveillance of implantable subdermal contraceptive: Secondary | ICD-10-CM | POA: Diagnosis not present

## 2016-11-15 LAB — POCT URINE PREGNANCY: Preg Test, Ur: NEGATIVE

## 2016-11-15 MED ORDER — ETONOGESTREL 68 MG ~~LOC~~ IMPL
68.0000 mg | DRUG_IMPLANT | Freq: Once | SUBCUTANEOUS | Status: AC
  Administered 2016-11-15: 68 mg via SUBCUTANEOUS
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
## 2016-11-15 NOTE — Patient Instructions (Signed)
Ms. Katrinka BlazingSmith,  Thank you for your patience today.  Please keep pressure dressing on until tomorrow.  Continue to use a back-up form of birth control for 1 week (condoms).  It is normal to have bruising at the site, but if you have redness or warmth, please call clinic.  Dr. Gwendolyn GrantWalden faxed ativan to your pharmacy earlier this week. Please make an appointment with him before this runs out to discuss future treatment plan for anxiety.  Best, Dr. Sampson GoonFitzgerald

## 2016-11-18 ENCOUNTER — Ambulatory Visit: Payer: Medicaid Other | Admitting: Family Medicine

## 2016-12-04 ENCOUNTER — Other Ambulatory Visit: Payer: Self-pay | Admitting: Internal Medicine

## 2016-12-26 ENCOUNTER — Ambulatory Visit: Payer: Medicaid Other | Admitting: Internal Medicine

## 2017-01-07 ENCOUNTER — Ambulatory Visit (INDEPENDENT_AMBULATORY_CARE_PROVIDER_SITE_OTHER): Payer: Medicaid Other | Admitting: Family Medicine

## 2017-01-07 ENCOUNTER — Encounter: Payer: Self-pay | Admitting: Family Medicine

## 2017-01-07 VITALS — BP 138/96 | HR 92 | Temp 98.8°F | Ht 64.0 in | Wt 216.0 lb

## 2017-01-07 DIAGNOSIS — R41840 Attention and concentration deficit: Secondary | ICD-10-CM | POA: Diagnosis not present

## 2017-01-07 DIAGNOSIS — F411 Generalized anxiety disorder: Secondary | ICD-10-CM | POA: Diagnosis not present

## 2017-01-07 DIAGNOSIS — I10 Essential (primary) hypertension: Secondary | ICD-10-CM

## 2017-01-07 DIAGNOSIS — Z3009 Encounter for other general counseling and advice on contraception: Secondary | ICD-10-CM

## 2017-01-07 HISTORY — DX: Attention and concentration deficit: R41.840

## 2017-01-07 MED ORDER — LISINOPRIL-HYDROCHLOROTHIAZIDE 20-25 MG PO TABS: 1.0000 | ORAL_TABLET | Freq: Every day | ORAL | 3 refills | Status: DC

## 2017-01-07 MED ORDER — LORAZEPAM 0.5 MG PO TABS: 0.5000 mg | ORAL_TABLET | Freq: Every evening | ORAL | 2 refills | Status: DC | PRN

## 2017-01-07 MED ORDER — LORAZEPAM 1 MG PO TABS: 1.0000 mg | ORAL_TABLET | Freq: Two times a day (BID) | ORAL | 1 refills | Status: DC | PRN

## 2017-01-07 MED ORDER — ATOMOXETINE HCL 40 MG PO CAPS: 40.0000 mg | ORAL_CAPSULE | Freq: Every day | ORAL | 0 refills | Status: DC
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
## 2017-01-07 NOTE — Assessment & Plan Note (Signed)
Long discussion with patient about not having her Removed. She states no one told her that she would have prolonged bleeding with this. Discussed that bleeding may continue for the next several months but then will eventually resolved. After discussion she plans to keep this in.

## 2017-01-07 NOTE — Assessment & Plan Note (Addendum)
Diastolic elevated here. Her systolic is what has been elevated outside of clinic. Recommended to follow up and bring her home blood pressure machine. She already has an appointment scheduled in 2 days for STD testing - to bring machine then. .Marland Kitchen

## 2017-01-07 NOTE — Progress Notes (Signed)
Dr. Gwendolyn GrantWalden requested a Behavioral Health Consult.   Presenting Issue:  Anxiety  Report of symptoms:  Patient reports struggling with anxiety for several years. She worries about her job, finances, her children, her mom, and worries about death. She feels that these worries often converge and this can be very overwhelming for her. These stressors can also cause her to be irritable and to have low energy. Her adult nephew is living in the home currently and he reportedly has schizophrenia and bipolar disorder, managed by [facility]. She noted that he can be a trigger for her irritability.   Duration of CURRENT symptoms:  Patient reported that anxiety has been a struggle for her for several years.   Impact on function:  Patient has been making many mistakes at work and feels that this contributes to her anxiety. Her anxiety may also be contributing to these mistakes.  Current and history of substance use:  Patient has been drinking alcohol often to cope, though has not been drinking more than a glass of wine at night over the last week due to concerns about weight gain and general health. Patient would like to cut down on alcohol use. She states her drinking has just increased in the last year. At most, she will drink 4 shots per night for 7 days. She states that drinking makes her feel better and that she has been crying most nights this week without having alcohol as a coping mechanism. Did not ask her about other substance use today.  Assessment / Plan / Recommendations: Patient appears to be struggling to cope with many stressors. She would likely benefit from following up to discuss stress management strategies. I discussed various options for therapy. She is interested in seeing me again, but would like to schedule 3 weeks out so that she can schedule around work. I will continue following up with her to discuss stress management, relaxation strategies, and problem solving.

## 2017-01-07 NOTE — Assessment & Plan Note (Signed)
Not taking SSRIs because they make her sleepy and cause hives.   Refills for ativan provided today, to use at bedtime.  Discussed that she will need therapist since she is not taking any daytime/controller medications currently.  She is interested in this and she met with [facility] today.

## 2017-01-07 NOTE — Patient Instructions (Signed)
It was good to see you again today as always.  Take the lisinopril/HCTZ combination for blood pressure.  Make a lab appointment in the next week so we can check your kidneys.   Take the Strattera for focus.  Take the lorazepam for anxiety.  Come back and see me in 3-4 weeks so we can see how you're doing with the new medicines.

## 2017-01-07 NOTE — Assessment & Plan Note (Signed)
Not actually diagnosed with ADHD. Does have many of the symptoms of at least attention deficit although not hyperactivity. We'll start her on Strattera as a non-stimulant medication. Follow-up in 3-4 weeks to see how she is doing with this.

## 2017-01-09 ENCOUNTER — Ambulatory Visit: Payer: Medicaid Other | Admitting: Family Medicine

## 2017-01-10 ENCOUNTER — Telehealth: Payer: Self-pay | Admitting: *Deleted

## 2017-01-10 NOTE — Telephone Encounter (Signed)
It appears that pt decided to keep her nexplanon according to office notes from 01/07/17, and did not come in for testing on 01/09/2017. Lamonte SakaiZimmerman Rumple, [name], [place]

## 2017-01-10 NOTE — Telephone Encounter (Signed)
-----   Message from Raliegh IpAshly M Gottschalk, DO sent at 01/02/2017  2:44 PM EST ----- Can we contact this patient regarding her Nexplanon?  I see that she was placed on my schedule 2/1 for BOTH STI testing and Nexplanon removal.  She will need a consultation visit before removal of Nexplanon.  Please make sure that she is not expecting to have her Nexplanon removed on that day to avoid any confusion.    Thanks, [employer]

## 2017-01-14 ENCOUNTER — Ambulatory Visit: Payer: Medicaid Other

## 2017-01-15 ENCOUNTER — Other Ambulatory Visit: Payer: Self-pay | Admitting: Internal Medicine

## 2017-01-17 ENCOUNTER — Other Ambulatory Visit (HOSPITAL_COMMUNITY)
Admission: RE | Admit: 2017-01-17 | Discharge: 2017-01-17 | Disposition: A | Discharge status: Home / Self Care | Payer: Medicaid Other | Source: Ambulatory Visit | Attending: Family Medicine | Admitting: Family Medicine

## 2017-01-17 ENCOUNTER — Ambulatory Visit (INDEPENDENT_AMBULATORY_CARE_PROVIDER_SITE_OTHER): Payer: Medicaid Other | Admitting: Family Medicine

## 2017-01-17 ENCOUNTER — Encounter: Payer: Self-pay | Admitting: Family Medicine

## 2017-01-17 VITALS — BP 138/92 | HR 95 | Temp 98.4°F | Ht 64.0 in | Wt 217.2 lb

## 2017-01-17 DIAGNOSIS — I10 Essential (primary) hypertension: Secondary | ICD-10-CM

## 2017-01-17 DIAGNOSIS — N898 Other specified noninflammatory disorders of vagina: Secondary | ICD-10-CM

## 2017-01-17 DIAGNOSIS — Z113 Encounter for screening for infections with a predominantly sexual mode of transmission: Secondary | ICD-10-CM | POA: Diagnosis not present

## 2017-01-17 LAB — POCT WET PREP (WET MOUNT)
Clue Cells Wet Prep Whiff POC: POSITIVE
Trichomonas Wet Prep HPF POC: ABSENT
WBC, Wet Prep HPF POC: >20

## 2017-01-17 MED ORDER — CHLORTHALIDONE 25 MG PO TABS: 25.0000 mg | ORAL_TABLET | Freq: Every day | ORAL | 3 refills | Status: DC

## 2017-01-17 MED ORDER — ETONOGESTREL 68 MG ~~LOC~~ IMPL: 1.0000 | DRUG_IMPLANT | Freq: Once | SUBCUTANEOUS | 0 refills | Status: DC

## 2017-01-17 MED ORDER — DESONIDE 0.05 % EX CREA: TOPICAL_CREAM | CUTANEOUS | 1 refills | Status: DC

## 2017-01-17 MED ORDER — METRONIDAZOLE 500 MG PO TABS
500.0000 mg | ORAL_TABLET | Freq: Two times a day (BID) | ORAL | 0 refills | Status: DC
Start: 2017-01-17 — End: 2018-01-08

## 2017-01-17 NOTE — Assessment & Plan Note (Signed)
Diastolic blood pressure continues to be elevated. Was 92 today. Systolic blood pressure 138. Patient reports that she had some lip swelling with lisinopril HCTZ.  she has also not been taking amlodipine. she is not currently on any blood pressure medications for the last week. Discussed patient with Dr. Gwendolyn GrantWalden as he is her PCP. The plan is as follows: -Chlorthalidone 25 mg daily -Stop amlodipine, lisinopril, HCTZ -Follow-up in one month with PCP

## 2017-01-17 NOTE — Progress Notes (Signed)
Subjective:    Patient ID: Lisa SayresMia F Crosby , female   DOB: 1981/09/23 , 36 y.o..   MRN: [id number]  HPI  Lisa Crosby is here for a same day visit for: Chief Complaint  Patient presents with  . Medication Problem    Blood pressure medication problem: Started lisinopril-HCTZ 4 days ago and then after 3 days her lips were more swollen. She is currently prescribed Amlodipine right now but is not taking. She has tried Clonidine in the past but she states for some reason it was stopped. She has also taken HCTZ alone before but she didn't like it. She admits to a mild cough when she was taking lisinopril as well. Denies any chest pain, shortness of breath, edema, or trouble swallowing.   Vaginal Discharge:   Having vaginal discharge for 3 days. Medications tried: none Discharge consistency: white clumpy Discharge color: White Recent antibiotic use: No Sex in last month: No Possible STD exposure: Yes, patient states that her boyfriend said that he may have an STD because he cheated on her.  Symptoms Fever: No  Dysuria: No Vaginal bleeding: No Abdomen or Pelvic pain: No Back pain: No Genital sores or ulcers: No Rash: No Pain during sex: No Missed menstrual period: No, has a nexplanon in place  ROS see HPI Smoking Status noted  Past Medical History: Patient Active Problem List   Diagnosis Date Noted  . Attention deficit 01/07/2017  . Birth control counseling 12/29/2015  . Hemorrhoid 07/06/2015  . Generalized anxiety disorder 02/26/2013  . Vaginal discharge 09/04/2012  . Obesity 01/06/2009  . TOBACCO ABUSE 01/06/2009  . HYPERTENSION, BENIGN 01/06/2009    Medications: reviewed   Social Hx:  reports that she has been smoking Cigarettes.  She has been smoking about 1.00 pack per day. She has never used smokeless tobacco.   Objective:   BP (!) 138/92   Pulse 95   Temp 98.4 F (36.9 C) (Oral)   Ht 5\' 4"  (1.626 m)   Wt 217 lb 3.2 oz (98.5 kg)   SpO2 97%   BMI 37.28 kg/m   Physical Exam  Gen: NAD, alert, cooperative with exam, well-appearing Cardiac: Regular rate and rhythm, normal S1/S2, no murmur, no edema, capillary refill brisk  Respiratory: Clear to auscultation bilaterally, no wheezes, non-labored breathing GYN:  External genitalia within normal limits.  Vaginal mucosa pink, moist, normal rugae.  Nonfriable cervix without lesions, no discharge or bleeding noted on speculum exam.  Bimanual exam revealed normal, nongravid uterus.  No cervical motion tenderness. No adnexal masses bilaterally.    Results for orders placed or performed in visit on 01/17/17  POCT Wet Prep [facility])  Result Value Ref Range   Source Wet Prep POC VAG    WBC, Wet Prep HPF POC >20    Bacteria Wet Prep HPF POC Many (A) Few   Clue Cells Wet Prep HPF POC Many (A) None   Clue Cells Wet Prep Whiff POC Positive Whiff    Yeast Wet Prep HPF POC None    Trichomonas Wet Prep HPF POC Absent Absent    Assessment & Plan:  HYPERTENSION, BENIGN Diastolic blood pressure continues to be elevated. Was 92 today. Systolic blood pressure 138. Patient reports that she had some lip swelling with lisinopril HCTZ.  she has also not been taking amlodipine. she is not currently on any blood pressure medications for the last week. Discussed patient with Dr. Gwendolyn GrantWalden as he is her PCP. The plan is as follows: -  Chlorthalidone 25 mg daily -Stop amlodipine, lisinopril, HCTZ -Follow-up in one month with PCP   Vaginal discharge Wet prep showing signs of bacterial vaginosis. No Trichomonas, no yeast. No signs of PID. Did not do pregnancy test as patient has Nexplanon.  -Metronidazole 500 mg twice a day 7 days -We'll await GC chlamydia results   Anders Simmonds, MD [facility], PGY-2

## 2017-01-17 NOTE — Patient Instructions (Addendum)
Thank you for coming in today, it was so nice to see you! Today we talked about:    Blood pressure: Stop taking amlodipine, hydrochlorothiazide and lisinopril. We will start you on a water pill called chlorthalidone. I have sent this prescription to your pharmacy. Please follow-up with Dr. Gwendolyn GrantWalden in one month.  Vaginal discharge; your studies show that you have bacterial vaginosis. This is not an STD. We are still waiting for your chlamydia and gonorrhea results to come back. If these are positive I'll call you and he will need to come in to the clinic to be treated.  Please follow up in 1 month. You can schedule this appointment at the front desk before you leave or call the clinic.  Bring in all your medications or supplements to each appointment for review.   If we ordered any tests today, you will be notified via telephone of any abnormalities. If everything is normal you will get a letter in the mail.   If you have any questions or concerns, please do not hesitate to call the office at [phone]. You can also message me directly via MyChart.   Sincerely,  Anders Simmondshristina Maybree Riling, MD

## 2017-01-17 NOTE — Assessment & Plan Note (Addendum)
Wet prep showing signs of bacterial vaginosis. No Trichomonas, no yeast. No signs of PID. Did not do pregnancy test as patient has Nexplanon.  -Metronidazole 500 mg twice a day 7 days -We'll await GC chlamydia results

## 2017-01-20 LAB — CERVICOVAGINAL ANCILLARY ONLY
Chlamydia: NEGATIVE
NEISSERIA GONORRHEA: NEGATIVE

## 2017-01-21 ENCOUNTER — Encounter: Payer: Self-pay | Admitting: Family Medicine

## 2017-01-28 ENCOUNTER — Ambulatory Visit: Payer: Medicaid Other

## 2017-02-17 IMAGING — US US OB FOLLOW-UP
1 series · 12 of 28 positions shown · non-contrast
Comparison: none

[Series 1: us ob follow up · 12 of 37 slices shown]
[im 2/37]
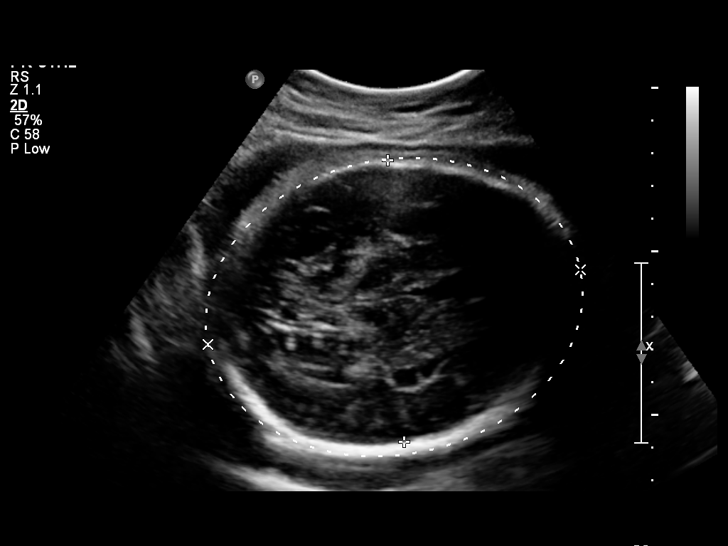
[im 5/37]
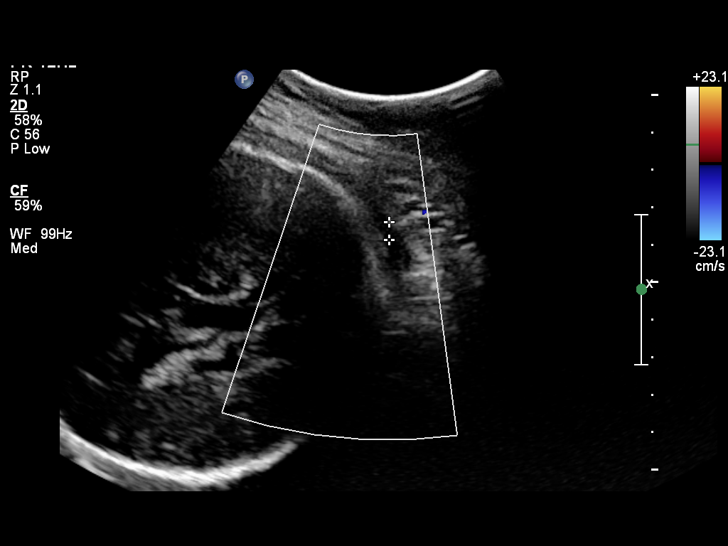
[im 7/37]
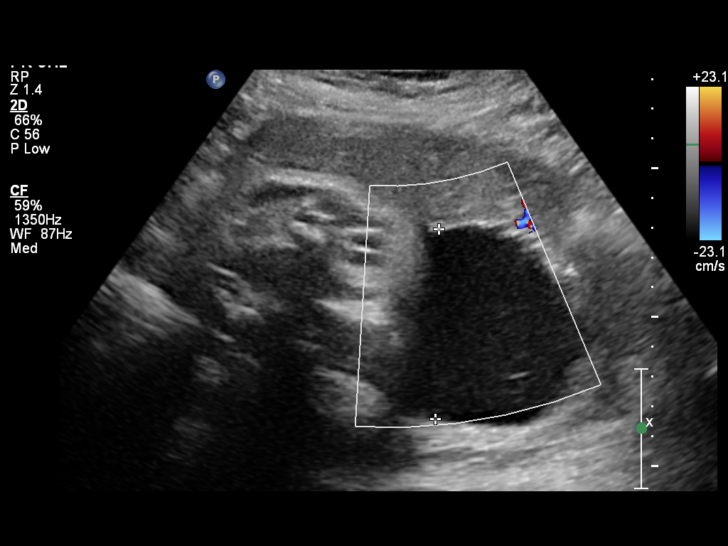
[im 11/37]
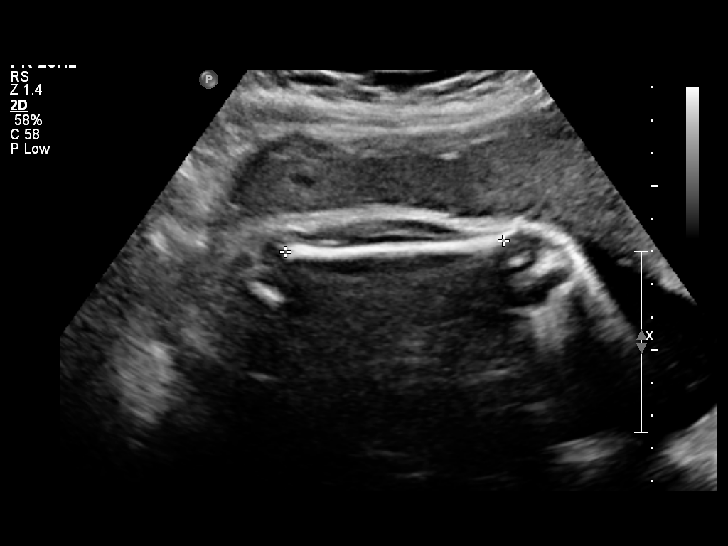
[im 14/37]
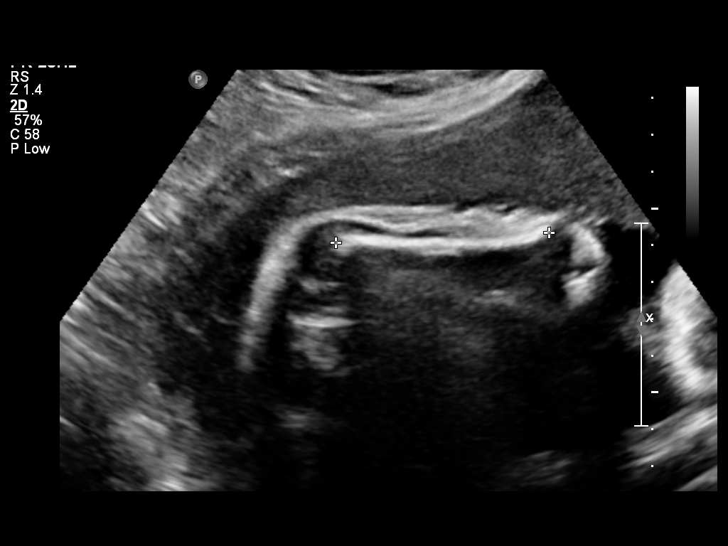
[im 17/37]
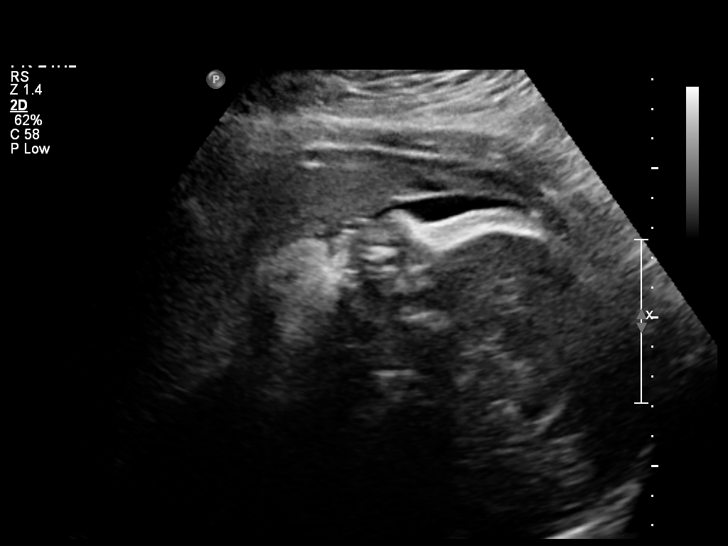
[im 21/37]
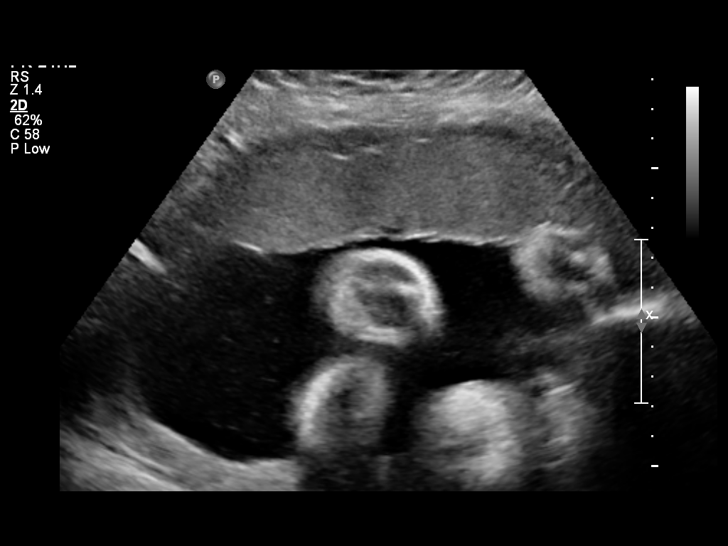
[im 23/37]
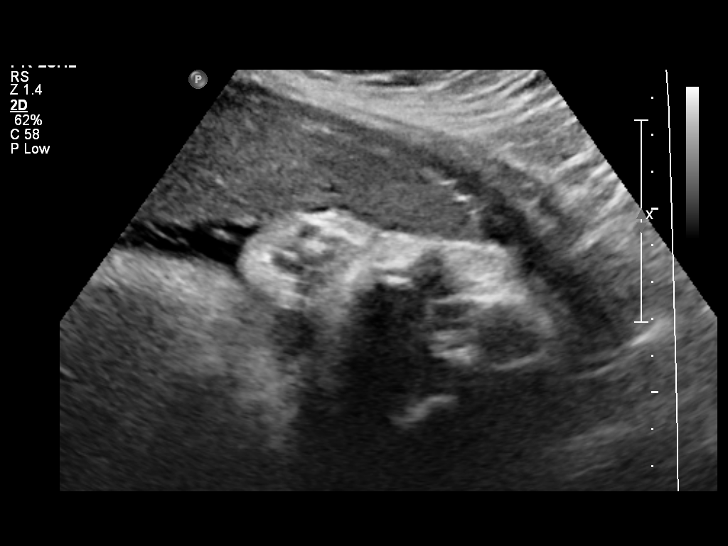
[im 26/37]
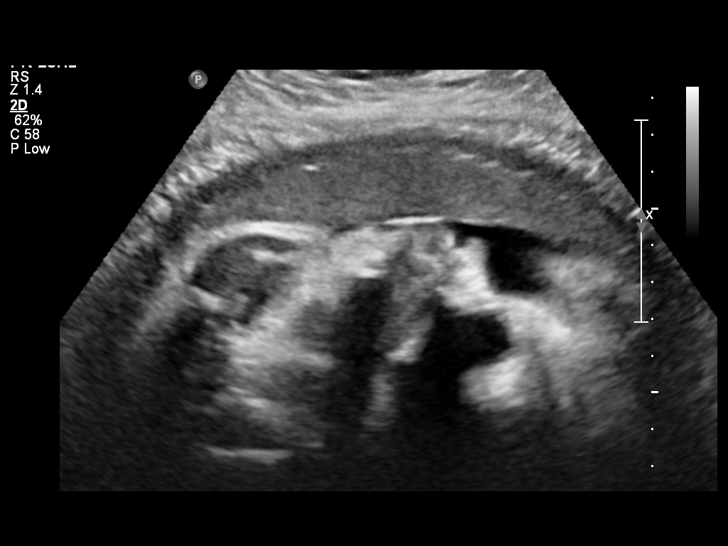
[im 30/37]
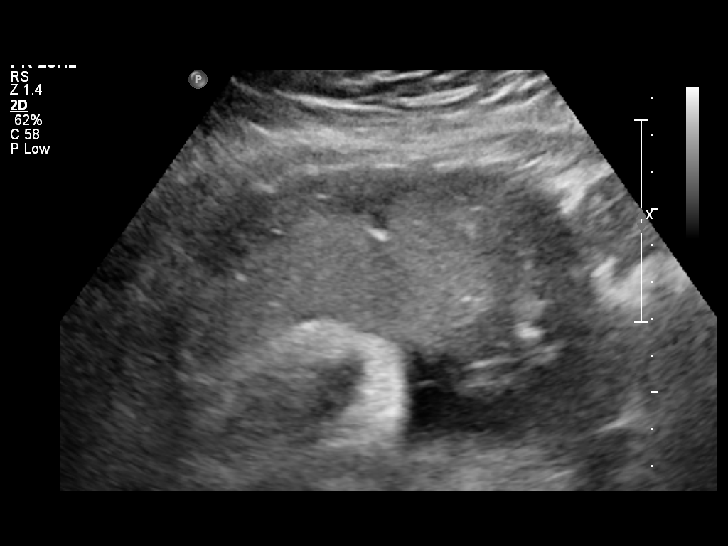
[im 33/37]
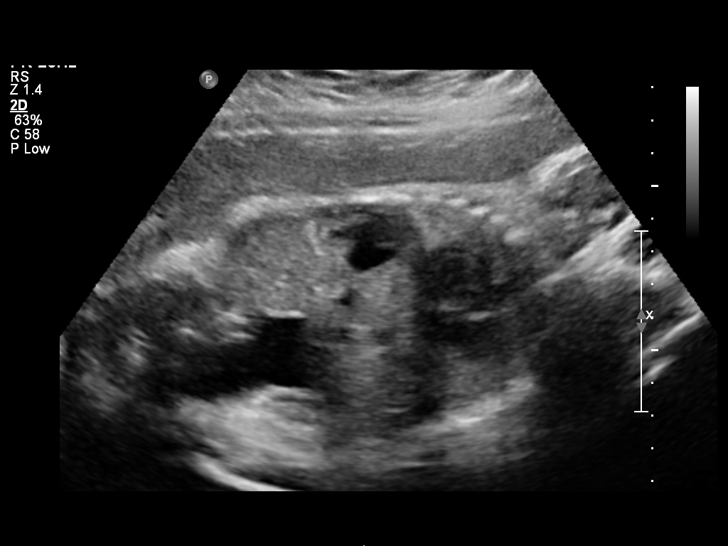
[im 35/37]
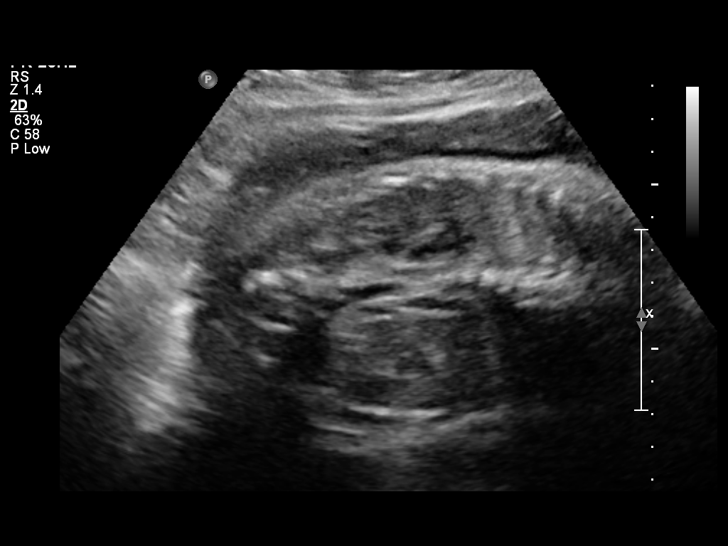

[12 of 28 positions shown; findings below may reference images not displayed]

OBSTETRICS REPORT
(Signed Final 04/12/2015 [DATE])

Service(s) Provided

US OB FOLLOW UP                                       76816.1
Indications

33 weeks gestation of pregnancy
Follow-up incomplete fetal anatomic evaluation        Z36
Maternal morbid obesity
Hypertension - Chronic/Pre-existing
Fetal Evaluation

Num Of Fetuses:    1
Fetal Heart Rate:  158                          bpm
Cardiac Activity:  Observed
Presentation:      Cephalic
Placenta:          Anterior, above cervical os
P. Cord            Previously Visualized
Insertion:

Amniotic Fluid
AFI FV:      Subjectively within normal limits
AFI Sum:     14.1    cm       49  %Tile     Larg Pckt:    6.35  cm
RUQ:   6.35    cm   RLQ:    3.4    cm    LUQ:   3.88    cm   LLQ:    0.47   cm
Biometry

BPD:     85.5  mm     G. Age:  34w 3d                CI:        69.64   70 - 86
FL/HC:      20.4   19.4 -
21.8
HC:       327  mm     G. Age:  37w 1d       92  %    HC/AC:      1.12   0.96 -
1.11
AC:     291.6  mm     G. Age:  33w 1d       41  %    FL/BPD:     78.0   71 - 87
FL:      66.7  mm     G. Age:  34w 2d       60  %    FL/AC:      22.9   20 - 24
HUM:     57.8  mm     G. Age:  33w 4d       57  %

Est. FW:    4449  gm      5 lb 2 oz     68  %
Gestational Age

LMP:           34w 4d        Date:  08/13/14                 EDD:   05/20/15
U/S Today:     34w 5d                                        EDD:   05/19/15
Best:          33w 4d     Det. By:  U/S (12/22/14)           EDD:   05/27/15
Anatomy
Cranium:          Appears normal         Aortic Arch:      Not well visualized
Fetal Cavum:      Previously seen        Ductal Arch:      Not well visualized
Ventricles:       Appears normal         Diaphragm:        Appears normal
Choroid Plexus:   Previously seen        Stomach:          Appears normal, left
sided
Cerebellum:       Previously seen        Abdomen:          Not well visualized
Posterior Fossa:  Previously seen        Abdominal Wall:   Not well visualized
Nuchal Fold:      Previously seen        Cord Vessels:     Previously seen
Face:             Not well visualized    Kidneys:          Appear normal
Lips:             Not well visualized    Bladder:          Appears normal
Heart:            Appears normal         Spine:            Not well visualized
(4CH, axis, and
situs)
RVOT:             Not well visualized    Lower             Previously seen
Extremities:
LVOT:             Not well visualized    Upper             Previously seen
Extremities:

Other:  Fetus appears to be a female. Technically difficult due to  maternal
habitus and fetal position.
Cervix Uterus Adnexa

Cervix:       Not visualized (advanced GA >69wks)
Uterus:       No abnormality visualized.
Cul De Sac:   No free fluid seen.
Left Ovary:    Not visualized.
Right Ovary:   Not visualized.

Adnexa:     No abnormality visualized.
Impression

SIUP at 33+4 weeks
Normal interval anatomy; no gross abnormalities identified
Normal amniotic fluid volume
Appropriate interval growth with EFW at the 68th %tile
Recommendations

Follow-up as clinically indicated

questions or concerns.

## 2017-03-01 ENCOUNTER — Other Ambulatory Visit: Payer: Self-pay | Admitting: Family Medicine

## 2017-03-30 ENCOUNTER — Other Ambulatory Visit: Payer: Self-pay | Admitting: Internal Medicine

## 2017-09-22 ENCOUNTER — Telehealth: Payer: Self-pay | Admitting: Family Medicine

## 2017-09-22 NOTE — Telephone Encounter (Signed)
**  After Hours/ Emergency Line Call*  Received a call to report that DEMAYA HARDGE wants to speak to Dr. Gwendolyn Grant personally. She is asking for a direct number for him, and I let her know that he is not on call tonight. She states she is having a nervous breakdown. Denies SI. Started a few hours ago, no acute trigger. Red flags discussed.  Will forward to PCP.  Loni Muse, MD PGY-2, [facility]

## 2017-09-24 ENCOUNTER — Telehealth: Payer: Self-pay

## 2017-09-24 NOTE — Progress Notes (Signed)
Social work consult form Lisa Crosby, CMA ref. patient in distress over not being able to placement her father in SNF.  Patient is tearful, denies SI or HI.  Patient reports taking father to ED and they were unable to assist her with placement.  The follow was discussed: various options, community resources- DSS, Medicaid, family support, financial stressors of being out of work, paying for hotel for her father, and father needs to establish care with a provider. Interventions: reflective listening, community support,  Plan:  1. Patient will [facility]  2. Contact the SNF to discuss all options for placement 3. Contact LCSW as needed for support.  Lisa Hineseborah Otha Rickles, LCSW Licensed Clinical Social Worker [facility]   [phone] 4:06 PM

## 2017-09-24 NOTE — Telephone Encounter (Signed)
Patient called and asked to speak to Dr. Gwendolyn GrantWalden because she stated that she was having a mental breakdown and that he was the one in the past who could help her.  I told her that Dr. Gwendolyn GrantWalden was on vacation but that if she wanted to speak to someone else I would help her and transfer the call.  She was in distress and started to cry.  She had me on speaker and her voice was muffled and cutting out a bit. She kept saying that she was at a lost and did not know what to do. I handed the situation off to Gavin PoundDeborah because I was afraid of her harming herself.  Her father was moved here from [place] and she  currently has him in a hotel which he needs to [employer].  She needs a form FL2 filled out for her father before she can place him in a facility for care.  He does not have a PCP at this point to fill it out. Gavin PoundDeborah will call her.Glennie HawkSimpson, Kaula Klenke R

## 2017-09-30 ENCOUNTER — Telehealth: Payer: Self-pay | Admitting: Family Medicine

## 2017-09-30 MED ORDER — LORAZEPAM 1 MG PO TABS: 1.0000 mg | ORAL_TABLET | Freq: Two times a day (BID) | ORAL | 1 refills | Status: DC | PRN

## 2017-09-30 MED FILL — LORazepam 1 MG TABS: 1 | 10 days supply | Qty: 20 | Fill #0

## 2017-09-30 NOTE — Telephone Encounter (Signed)
*  Previous conversations with patient documented under her father Georgina SnellFrederick Creed's name.    Lisa Crosby is very anxious, and "at the edge."  She is living in a motel, having pawned many of her possessions, while trying to care for her ailing father.    She would like something for anxiety.  I will refill her prior Ativan in the short-term.  She is bringing her father in to be seen this pm for unknown bleeding in his pampers.    She will see me after that and we will restart her celexa.  Denied any SI/HI

## 2017-09-30 NOTE — Progress Notes (Signed)
Patient received Rx from PCP but has no insurance and no money to get Rx.  [facility] to [facility]. [facility] Vou[facility]:  Previous assistance?  no Taxi, Bus pass, gas card, food, or other? no Med co-pay? no         [facility] Indigent Fund?  yes Other:  NA  Provider:  Dr. Gwendolyn GrantWalden Clinic Staff:  Soundra PilonMoore, Zackory Pudlo H, LCSW   Sammuel Hineseborah Aniyia Rane, LCSW Licensed Clinical Social Worker [facility]   [phone] 2:53 PM

## 2017-10-01 NOTE — Progress Notes (Signed)
LCSW received phone call from patient.  Patient is cheerful and wanted to thank LCSW for assistance.  Reported she has picked up her medication and father is at the nursing home.  Patient will return to work this week. States she will bring in papers for PCP to complete that she needs for her job to explain why she was out of work.  PCP notified via in-basket.  Sammuel Hineseborah Avarae Zwart, LCSW Licensed Clinical Social Worker [facility]   [phone] 3:52 PM

## 2017-11-14 ENCOUNTER — Other Ambulatory Visit: Payer: Self-pay | Admitting: Family Medicine

## 2017-12-12 IMAGING — CR DG CHEST 2V
2 series · 2 of 2 positions shown · non-contrast
Comparison: 01/12/2014

CLINICAL DATA: Left-sided chest pain

EXAM:
CHEST  2 VIEW

[chest pa]
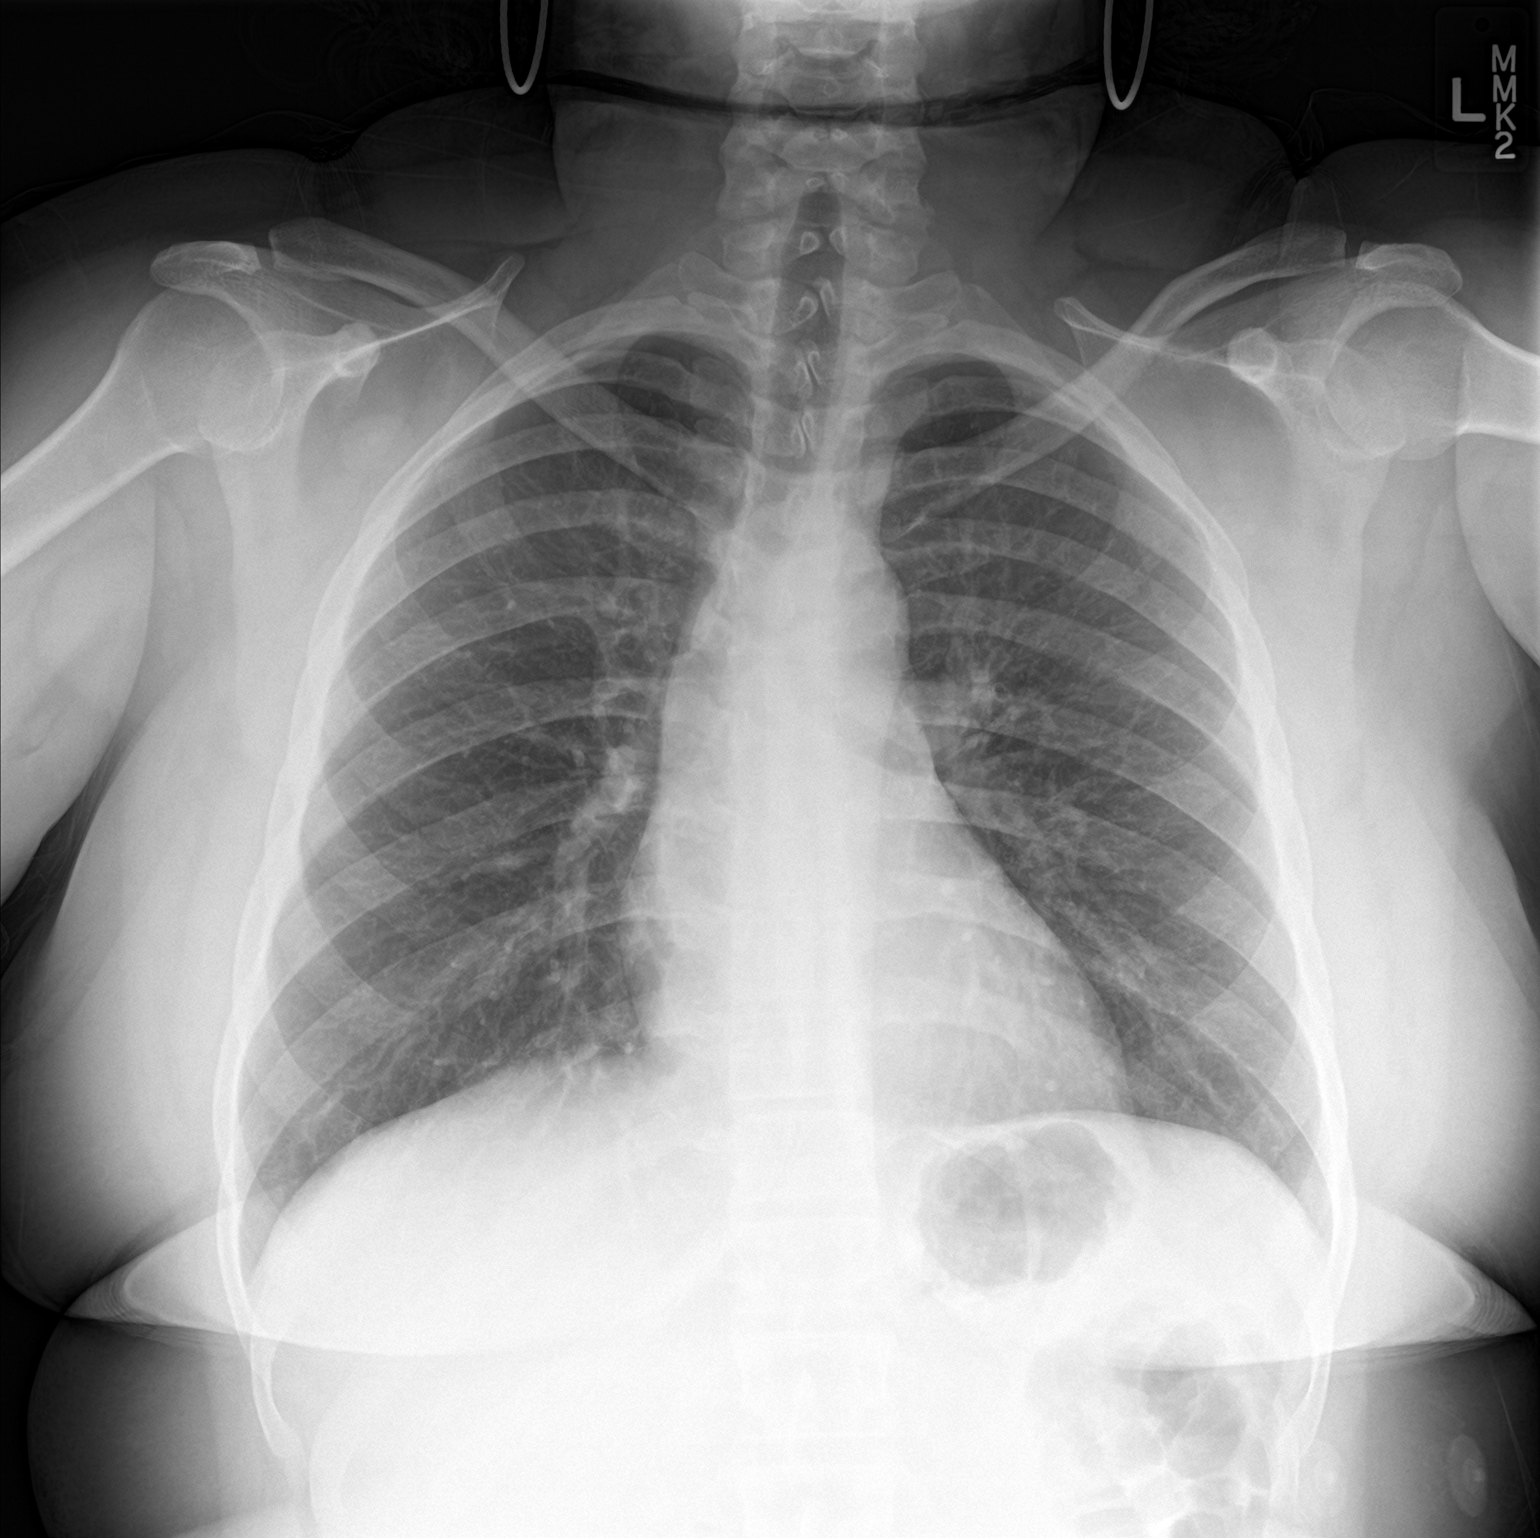

[chest lat]
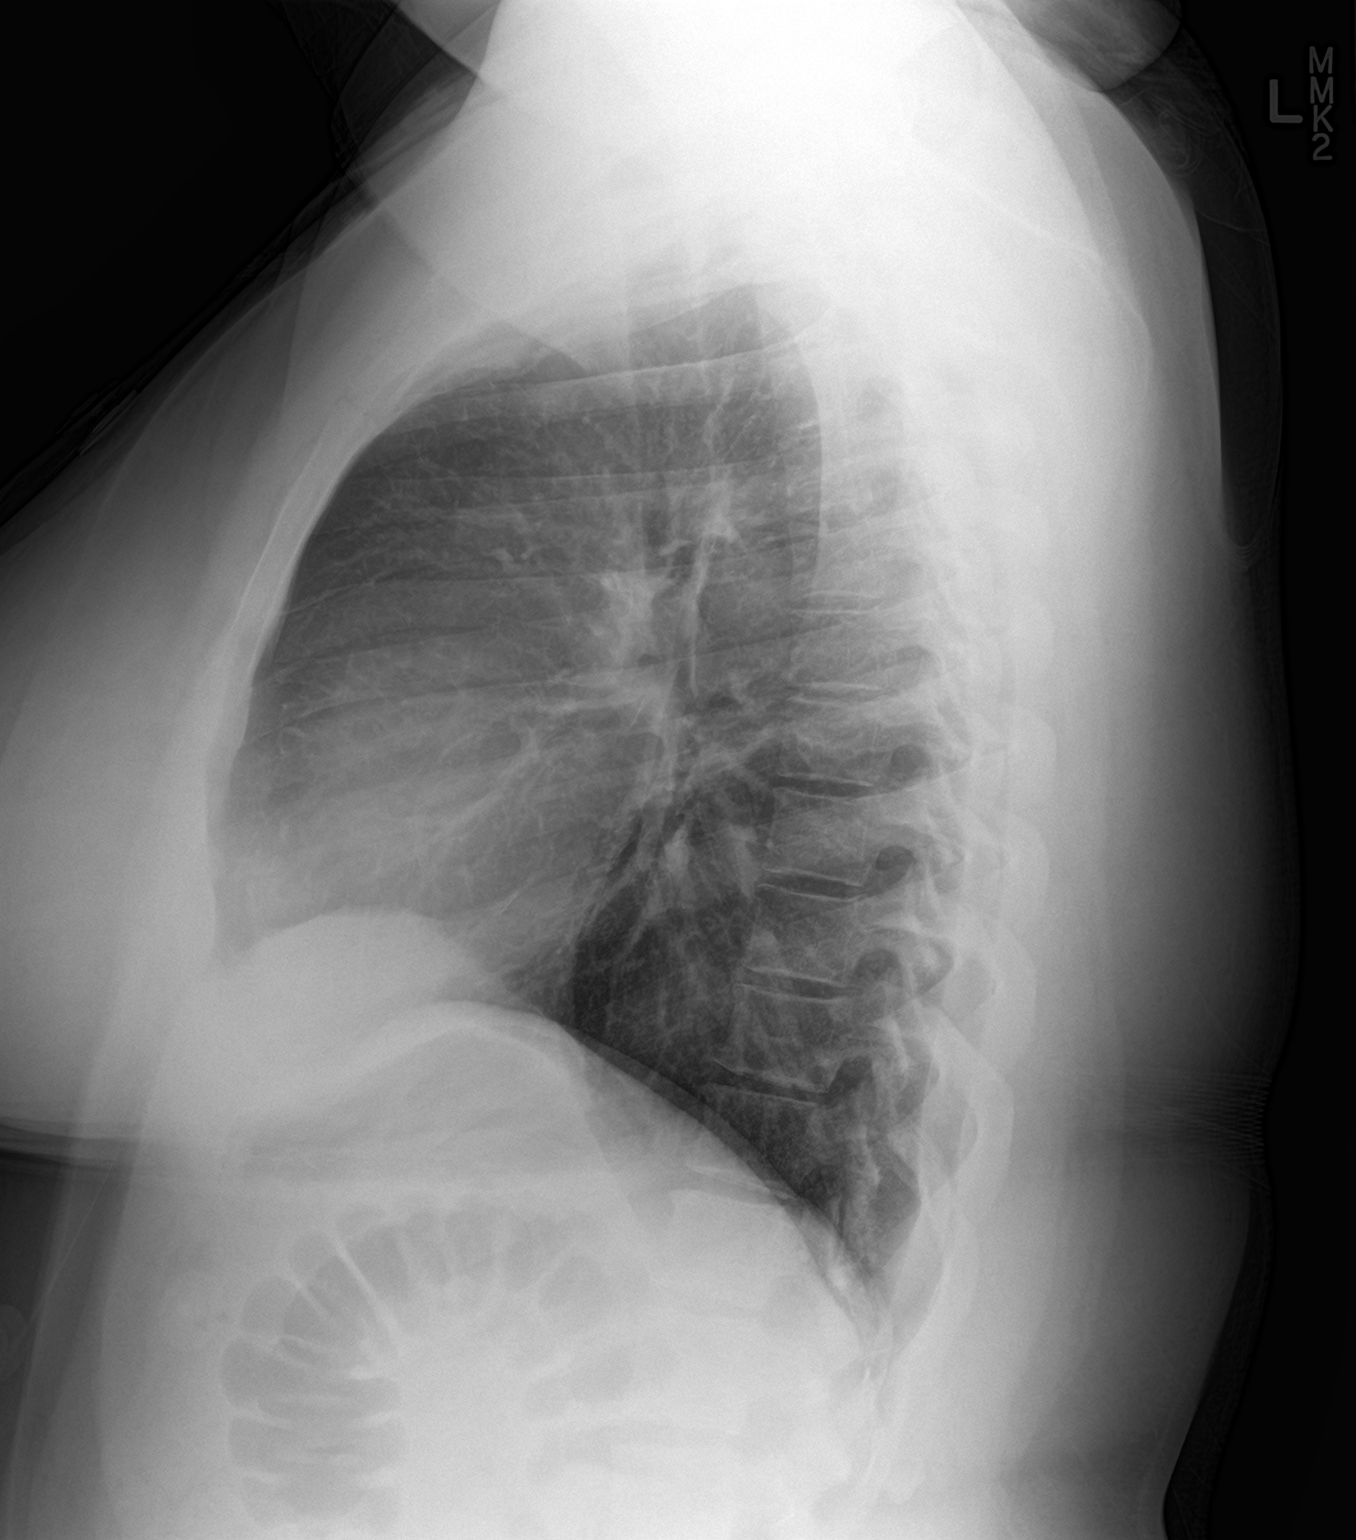

[2 of 2 positions shown; findings below may reference images not displayed]

FINDINGS: Cardiac shadow is within normal limits. The lungs are well aerated
bilaterally. No focal infiltrate is seen. The bony structures are
stable in appearance.
IMPRESSION: No active cardiopulmonary disease.

## 2018-01-07 ENCOUNTER — Other Ambulatory Visit (HOSPITAL_COMMUNITY)
Admission: RE | Admit: 2018-01-07 | Discharge: 2018-01-07 | Disposition: A | Discharge status: Home / Self Care | Payer: Medicaid Other | Source: Ambulatory Visit | Attending: Family Medicine | Admitting: Family Medicine

## 2018-01-07 ENCOUNTER — Encounter: Payer: Self-pay | Admitting: Family Medicine

## 2018-01-07 ENCOUNTER — Other Ambulatory Visit: Payer: Self-pay

## 2018-01-07 ENCOUNTER — Ambulatory Visit (INDEPENDENT_AMBULATORY_CARE_PROVIDER_SITE_OTHER): Payer: Self-pay | Admitting: Family Medicine

## 2018-01-07 VITALS — BP 142/98 | HR 86 | Temp 98.9°F | Wt 223.0 lb

## 2018-01-07 DIAGNOSIS — Z113 Encounter for screening for infections with a predominantly sexual mode of transmission: Secondary | ICD-10-CM | POA: Insufficient documentation

## 2018-01-07 LAB — POCT WET PREP (WET MOUNT)
Clue Cells Wet Prep Whiff POC: POSITIVE
Trichomonas Wet Prep HPF POC: ABSENT

## 2018-01-07 NOTE — Progress Notes (Signed)
   Subjective:   Patient ID: Lisa Crosby    DOB: Jul 06, 1981, 37 y.o. female   MRN: [id number]  CC: STD testing  HPI: Lisa LanceMia F Katrinka Crosby is a 37 y.o. female who presents to clinic today for the following issue.  STD testing  Patient desires STD testing today. She is asymptomatic and denies burning with urination, frequency, urgency.  No vaginal irritation.  No abnormal vaginal bleeding.  She has a new sexual partner.    - Contraception: Nexplanon -She has h/o chlamydia which was treated, symptoms had resolved  ROS: Denies fever, chills, nausea, vomiting, dysuria, frequency, urgency.  No pelvic or abdominal pain.  Social: Current every day smoker Medications reviewed.  Objective:   BP (!) 142/98   Pulse 86   Temp 98.9 F (37.2 C) (Oral)   Wt 223 lb (101.2 kg)   SpO2 98%   BMI 38.28 kg/m  Vitals and nursing note reviewed.  General: 37 year old female, NAD CV: RRR no MRG Lungs: Clear, normal effort Abdomen: soft, NT ND, positive bowel sounds Pelvic exam: normal external female genitalia.  VULVA: normal appearing vulva with no masses, tenderness or lesions, VAGINA: normal appearing vagina with normal color and small amount of physiologic discharge present, no lesions, CERVIX: normal appearing cervix without discharge or lesions.  No CMT noted on bimanual exam.   Skin: warm, dry, no rash Extremities: warm and well perfused  Assessment & Plan:   STD testing Asymptomatic and has a new sexual partner, desires testing.  Contraception with Nexplanon.  - Wet prep - GC chlamydia -HIV and RPR  -Counseled on safe sex  Orders Placed This Encounter  Procedures  . HIV antibody (with reflex)  . RPR  . POCT Wet Prep Lisa Crosby([facility] [place])   Follow up: PRN   Lisa MarchYashika Barnett Elzey, MD [facility], PGY-2 01/14/2018 9:26 AM

## 2018-01-07 NOTE — Patient Instructions (Signed)
You were seen in clinic today for STD testing.  We will call you once I have the results of these.  If you have any questions please call clinic.  Be well, Freddrick MarchYashika Deira Shimer, MD

## 2018-01-08 ENCOUNTER — Other Ambulatory Visit: Payer: Self-pay | Admitting: Family Medicine

## 2018-01-08 LAB — CERVICOVAGINAL ANCILLARY ONLY
Chlamydia: NEGATIVE
Neisseria Gonorrhea: NEGATIVE

## 2018-01-08 LAB — HIV ANTIBODY (ROUTINE TESTING W REFLEX): HIV Screen 4th Generation wRfx: NONREACTIVE

## 2018-01-08 LAB — RPR: RPR Ser Ql: NONREACTIVE

## 2018-01-08 MED ORDER — METRONIDAZOLE 500 MG PO TABS: 500.0000 mg | ORAL_TABLET | Freq: Two times a day (BID) | ORAL | 0 refills | Status: DC

## 2018-01-12 ENCOUNTER — Other Ambulatory Visit: Payer: Self-pay | Admitting: Internal Medicine

## 2018-01-12 ENCOUNTER — Telehealth: Payer: Self-pay | Admitting: Family Medicine

## 2018-01-12 NOTE — Telephone Encounter (Signed)
Would like lab results

## 2018-01-14 ENCOUNTER — Encounter: Payer: Self-pay | Admitting: Family Medicine

## 2018-01-16 NOTE — Telephone Encounter (Signed)
Seen by Dr. Nelson ChimesAmin.  Looks like STD testing was negative, wet prep showed BV.  Unclear if Cheyene has been treated for this.  Will send to Dr. Nelson ChimesAmin

## 2018-01-16 NOTE — Telephone Encounter (Signed)
Called Lisa Crosby back on 1/31 with Flagyl Rx sent to her pharmacy.  Discussed with her today that the remainder of her STD testing was negative.  Thanks

## 2018-01-26 ENCOUNTER — Telehealth: Payer: Self-pay | Admitting: Family Medicine

## 2018-01-26 NOTE — Telephone Encounter (Signed)
Pt is calling because she was drinking this weekend while taking her medication and she was not supposed to drink. Can we send in another round of medication.  Flagyl. jw

## 2018-02-12 ENCOUNTER — Telehealth: Payer: Self-pay | Admitting: Family Medicine

## 2018-02-12 MED ORDER — METRONIDAZOLE 0.75 % VA GEL: 1.0000 | Freq: Two times a day (BID) | VAGINAL | 0 refills | Status: DC

## 2018-02-12 NOTE — Telephone Encounter (Signed)
Pt tried to take the medicine prescribed for BV but it made her sick.  She still have BV with a discharge.  She wants to know if she can get something else like a cream. Please advise

## 2018-02-12 NOTE — Telephone Encounter (Signed)
I sent this to her [address] pharmacy.  Please call and cancel the [facility] prescription.  Sorry!

## 2018-02-12 NOTE — Telephone Encounter (Signed)
I have sent in Metrogel for her to her pharmacy.  Please call and let her know.

## 2018-02-12 NOTE — Telephone Encounter (Signed)
Pt request the medication be sent to [facility] on [address]. Please advise. Sunday SpillersSharon T Keelie Zemanek, CMA

## 2018-02-13 NOTE — Telephone Encounter (Signed)
[facility] informed. Lisa Crosby, Lisa Crosby, CMA

## 2018-02-24 ENCOUNTER — Telehealth: Payer: Self-pay

## 2018-02-24 NOTE — Telephone Encounter (Signed)
Pt called nurse line with concerns after finishing her metrogel. Pt states while she was using the metrogel she had a strange vaginal discharge. She also states her periods are lighter than normal and more of a brown color than bright red. Pt advised the metrogel can cause a strange discharge but that is normal and her changes in her periods are likely due to her Nexplanon. Pt reassured but will come back for reevaluation is anything changes. Shawna OrleansMeredith B Thomsen, RN

## 2018-04-10 ENCOUNTER — Ambulatory Visit (INDEPENDENT_AMBULATORY_CARE_PROVIDER_SITE_OTHER): Payer: Self-pay | Admitting: Internal Medicine

## 2018-04-10 ENCOUNTER — Encounter: Payer: Self-pay | Admitting: Internal Medicine

## 2018-04-10 ENCOUNTER — Other Ambulatory Visit: Payer: Self-pay

## 2018-04-10 VITALS — BP 126/80 | HR 96 | Temp 98.7°F | Ht 64.0 in | Wt 212.0 lb

## 2018-04-10 DIAGNOSIS — R1013 Epigastric pain: Secondary | ICD-10-CM | POA: Insufficient documentation

## 2018-04-10 HISTORY — DX: Epigastric pain: R10.13

## 2018-04-10 MED ORDER — PANTOPRAZOLE SODIUM 40 MG PO TBEC: 40.0000 mg | DELAYED_RELEASE_TABLET | Freq: Every day | ORAL | 0 refills | Status: DC

## 2018-04-10 MED ORDER — AMLODIPINE BESYLATE 10 MG PO TABS: 10.0000 mg | ORAL_TABLET | Freq: Every day | ORAL | 2 refills | Status: DC

## 2018-04-10 NOTE — Progress Notes (Signed)
   [facility] Phone: [phone]  Subjective:  Lisa Crosby is a 37 year old female presenting to clinic with heartburn for the last couple of months. She states she is having epigastric "burning" pain, burning in her chest, and burping. Her symptoms are worse within 5 minutes of eating. She has noticed worse symptoms when she eats [place] food. She has tried [employer], Zantac, and Prilosec, which used to help, but are not helping as much anymore. No taking any NSAIDs or Prednisone. No chest pain, no pain with exertion, no shortness of breath, no nausea, no vomiting. She does endorse a 10lb weight loss in the last 4 months, but states she has been drinking more water and trying to eat healthier. She is still smoking.  ROS: See HPI for pertinent positives and negatives  Past Medical History- HTN, GAD, ADHD, obesity  Family history reviewed for today's visit. No changes.  Social history- patient is a current smoker  Objective: BP 126/80   Pulse 96   Temp 98.7 F (37.1 C) (Oral)   Ht  (1.626 m)   Wt 212 lb (96.2 kg)   SpO2 98%   BMI 36.39 kg/m  Gen: NAD, alert, cooperative with exam HEENT: NCAT, EOMI, MMM Neck: FROM, supple CV: RRR, no murmur, no tenderness to palpation of the sternum. Resp: CTABL, no wheezes, normal work of breathing GI: +mild epigastric pain, Murphy's sign negative, +BS, non-distended, no rebound, no guarding  Assessment/Plan: Epigastric Abdominal Pain: Likely GERD, given her associated burping/heartburn and the fact that the pain is worse within 5 minutes of a meal and worse with eating [place] food. H. Pylori infection also possible, given that patient's heartburn is getting worse and is no longer being relieved by OTC meds. Tobacco use could also be contributing. Cardiac etiology unlikely, given patient's age and patient is not having exertional chest pain. No sternal tenderness to suggest musculoskeletal etiology. Not using NSAIDs, so don't think  medications are contributing. No RUQ pain to suggest gallbladder disease. - Urea breath test ordered - Start Protonix  daily x 4 weeks - Has follow-up appointment already scheduled with PCP on 5/14 - She does have 10lb weight loss over the last 4 months (after trying to drink more water) and is a smoker, but think esophageal/gastric malignancy is unlikely due to her age. Could consider referral for endoscopy if symptoms continue to worsen.  Willadean Carol, MD PGY-3

## 2018-04-10 NOTE — Assessment & Plan Note (Addendum)
Likely GERD, given her associated burping/heartburn and the fact that the pain is worse within 5 minutes of a meal and worse with eating [place] food. H. Pylori infection also possible, given that patient's heartburn is getting worse and is no longer being relieved by OTC meds. Tobacco use could also be contributing. Cardiac etiology unlikely, given patient's age and patient is not having exertional chest pain. No sternal tenderness to suggest musculoskeletal etiology. Not using NSAIDs, so don't think medications are contributing. No RUQ pain to suggest gallbladder disease. - Urea breath test ordered - Start Protonix  daily x 4 weeks - Has follow-up appointment already scheduled with PCP on 5/14 - She does have 10lb weight loss over the last 4 months (after trying to drink more water) and is a smoker, but think esophageal/gastric malignancy is unlikely due to her age. Could consider referral for endoscopy if symptoms continue to worsen.

## 2018-04-10 NOTE — Patient Instructions (Signed)
It was so nice to meet you!  I have ordered a urea breath test today to see if you have a bacteria in your stomach called H. Pylori. I will call you next week with your results.   I have also prescribed some Protonix. Please use this once a day.  Please don't take any NSAIDs (Ibuprofen, Voltaren, Motrin, Aspirin).  We will see you back in clinic on 5/14 for your appointment with Dr. Gwendolyn Grant.  -Dr. Nancy Marus

## 2018-04-12 LAB — H. PYLORI BREATH TEST: H PYLORI BREATH TEST: POSITIVE — AB

## 2018-04-13 ENCOUNTER — Telehealth: Payer: Self-pay | Admitting: Internal Medicine

## 2018-04-13 ENCOUNTER — Other Ambulatory Visit: Payer: Self-pay | Admitting: Internal Medicine

## 2018-04-13 MED ORDER — AMOXICILLIN 500 MG PO TABS: 1000.0000 mg | ORAL_TABLET | Freq: Two times a day (BID) | ORAL | 0 refills | Status: DC

## 2018-04-13 MED ORDER — CLARITHROMYCIN 500 MG PO TABS: 500.0000 mg | ORAL_TABLET | Freq: Two times a day (BID) | ORAL | 0 refills | Status: DC

## 2018-04-13 NOTE — Telephone Encounter (Signed)
Will forward to MD, as she was recently seen by [facility].

## 2018-04-13 NOTE — Telephone Encounter (Signed)
Pt called and would like Dr Nancy Marus to give her a call to discuss the issues she is having with her stomach. She said she has looked into some things and now has more questions.

## 2018-04-13 NOTE — Progress Notes (Signed)
Called patient to discuss her lab results with her. Her urea breath test came back positive. Will treat with triple therapy x 14 days. I have already prescribed Protonix for her. Will also add Amoxicillin 1g bid and Clarithromycin  bid. Patient voiced understanding. All questions answered.  Willadean Carol, MD PGY-3

## 2018-04-14 NOTE — Telephone Encounter (Signed)
Returned patient's call. Voicemail box was full, so I was unable to leave a message. Will try calling patient again tomorrow.  Willadean Carol, MD PGY-3

## 2018-04-15 NOTE — Telephone Encounter (Signed)
Pt returning Mayo's call. Pt also asked why her medication hasn't been called. Please call pt back by end of the day today.

## 2018-04-16 ENCOUNTER — Telehealth: Payer: Self-pay | Admitting: Internal Medicine

## 2018-04-16 MED ORDER — AMOXICILLIN 500 MG PO TABS: 1000.0000 mg | ORAL_TABLET | Freq: Two times a day (BID) | ORAL | 0 refills | Status: DC

## 2018-04-16 MED ORDER — CLARITHROMYCIN 500 MG PO TABS: 500.0000 mg | ORAL_TABLET | Freq: Two times a day (BID) | ORAL | 0 refills | Status: DC

## 2018-04-16 MED ORDER — PANTOPRAZOLE SODIUM 40 MG PO TBEC: 40.0000 mg | DELAYED_RELEASE_TABLET | Freq: Every day | ORAL | 0 refills | Status: DC

## 2018-04-16 MED ORDER — FLUCONAZOLE 150 MG PO TABS: 150.0000 mg | ORAL_TABLET | Freq: Once | ORAL | 0 refills | Status: AC

## 2018-04-16 NOTE — Telephone Encounter (Signed)
Will forward to MD to advise. Adrionna Delcid,CMA  

## 2018-04-16 NOTE — Telephone Encounter (Signed)
Pt called and said when she spoke with Dr Nancy Marus on Monday she told her that she would have three antibiotics to take but she only had Amoxicillin ready for her at the pharmacy. Pt is still in a lot of pain. Pt would like to be contacted.

## 2018-04-16 NOTE — Telephone Encounter (Signed)
Returned patient's call. She wanted the prescriptions sent to [facility] on [address], not [facility]. New prescriptions sent to [facility]. She should take Amoxicillin, Clarithromycin, and Protonix bid x 14 days. She also requested Diflucan, as she always gets yeast infections whenever she takes antibiotics. Diflucan also prescribed. All questions answered. Patient has follow-up appointment with PCP on 04/21/18.  Willadean Carol, MD PGY-3

## 2018-04-21 ENCOUNTER — Ambulatory Visit (INDEPENDENT_AMBULATORY_CARE_PROVIDER_SITE_OTHER): Payer: Self-pay | Admitting: Family Medicine

## 2018-04-21 ENCOUNTER — Other Ambulatory Visit: Payer: Self-pay

## 2018-04-21 ENCOUNTER — Encounter: Payer: Self-pay | Admitting: Family Medicine

## 2018-04-21 VITALS — BP 128/92 | HR 81 | Temp 99.2°F | Ht 64.0 in | Wt 213.0 lb

## 2018-04-21 DIAGNOSIS — R1013 Epigastric pain: Secondary | ICD-10-CM

## 2018-04-21 NOTE — Assessment & Plan Note (Signed)
Likely ongoing secondary to h pylori diagnosis.   Follow up after she has finished her medicine to check for any improvement.  She is on 2 weeks worth of h pylori treatment.   Checking CMET today to ensure no other causes of epigastric pain evident.   No red flags.
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
## 2018-04-21 NOTE — Addendum Note (Signed)
Addended byGwendolyn Grant, Oluwadamilola Rosamond H on: 04/21/2018 11:36 AM   Modules accepted: Orders

## 2018-04-21 NOTE — Patient Instructions (Signed)
It was good to see you again today  We are checking labs for your stomach and I will let you know about them.

## 2018-04-22 LAB — COMPREHENSIVE METABOLIC PANEL
A/G RATIO: 1.4 (ref 1.2–2.2)
ALT: 16 IU/L (ref 0–32)
AST: 18 IU/L (ref 0–40)
Albumin: 4.1 g/dL (ref 3.5–5.5)
Alkaline Phosphatase: 105 IU/L (ref 39–117)
BUN / CREAT RATIO: 7 — AB (ref 9–23)
BUN: 6 mg/dL (ref 6–20)
Bilirubin Total: 0.3 mg/dL (ref 0.0–1.2)
CALCIUM: 9.1 mg/dL (ref 8.7–10.2)
CO2: 20 mmol/L (ref 20–29)
Chloride: 106 mmol/L (ref 96–106)
Creatinine, Ser: 0.89 mg/dL (ref 0.57–1.00)
GFR calc Af Amer: 98 mL/min/{1.73_m2} (ref >59)
GFR, EST NON AFRICAN AMERICAN: 85 mL/min/{1.73_m2} (ref >59)
GLOBULIN, TOTAL: 2.9 g/dL (ref 1.5–4.5)
Glucose: 89 mg/dL (ref 65–99)
POTASSIUM: 4.4 mmol/L (ref 3.5–5.2)
SODIUM: 139 mmol/L (ref 134–144)
Total Protein: 7 g/dL (ref 6.0–8.5)

## 2018-04-22 LAB — CBC
HEMOGLOBIN: 13.7 g/dL (ref 11.1–15.9)
Hematocrit: 42.2 % (ref 34.0–46.6)
MCH: 29.3 pg (ref 26.6–33.0)
MCHC: 32.5 g/dL (ref 31.5–35.7)
MCV: 90 fL (ref 79–97)
Platelets: 409 10*3/uL — ABNORMAL HIGH (ref 150–379)
RBC: 4.68 x10E6/uL (ref 3.77–5.28)
RDW: 13.6 % (ref 12.3–15.4)
WBC: 5.8 10*3/uL (ref 3.4–10.8)

## 2018-04-22 LAB — LIPASE: LIPASE: 126 U/L — AB (ref 14–72)

## 2018-04-23 ENCOUNTER — Telehealth: Payer: Self-pay | Admitting: Family Medicine

## 2018-04-23 ENCOUNTER — Ambulatory Visit (HOSPITAL_COMMUNITY)
Admission: RE | Admit: 2018-04-23 | Discharge: 2018-04-23 | Disposition: A | Discharge status: Home / Self Care | Payer: Medicaid Other | Source: Ambulatory Visit | Attending: Family Medicine | Admitting: Family Medicine

## 2018-04-23 DIAGNOSIS — K859 Acute pancreatitis without necrosis or infection, unspecified: Secondary | ICD-10-CM

## 2018-04-23 DIAGNOSIS — I700 Atherosclerosis of aorta: Secondary | ICD-10-CM | POA: Insufficient documentation

## 2018-04-23 DIAGNOSIS — E279 Disorder of adrenal gland, unspecified: Secondary | ICD-10-CM | POA: Insufficient documentation

## 2018-04-23 DIAGNOSIS — K819 Cholecystitis, unspecified: Secondary | ICD-10-CM | POA: Diagnosis not present

## 2018-04-23 DIAGNOSIS — R748 Abnormal levels of other serum enzymes: Secondary | ICD-10-CM | POA: Diagnosis not present

## 2018-04-23 MED ORDER — IOPAMIDOL (ISOVUE-300) INJECTION 61%
100.0000 mL | Freq: Once | INTRAVENOUS | Status: AC | PRN
  Administered 2018-04-23: 100 mL via INTRAVENOUS

## 2018-04-23 MED ORDER — IOPAMIDOL (ISOVUE-300) INJECTION 61%
INTRAVENOUS | Status: AC
  Filled 2018-04-23: qty 100

## 2018-04-23 NOTE — Telephone Encounter (Signed)
Called patient to discuss elevated lipase.  Still having daily sharp stabbing abdominal pain in epigastrum radiating straight through to back.  No help thus far with tx for h pylori.    Some bloating and diarrhea as well.  Plan CT abd STAT to see if pancreatitis.  Repeat lipase tomorrow after CT done pending results.  Will schedule this and call patient back.

## 2018-04-23 NOTE — Telephone Encounter (Signed)
Pt called requesting Lisa Crosby call her with her CT results. She was very adamant that it be today. Please advise

## 2018-04-23 NOTE — Telephone Encounter (Signed)
**  After Hours/ Emergency Line Call*  Received a call to report that Lisa Crosby is having abdominal pain and diarrhea. She started tx for h. Pylori on Monday and has been having diarrhea since. She reported a spot of blood when wiping today. Her stomach pain is cramping and she does not know what to take for pain. She also wants to know what her CT from today showed. Discussed normal CT results. Advised patient to take tylenol for pain, try heating pad, avoid NSAIDs. Offered to make appointment for her to be seen in clinic tomorrow but she declined and hung up. Will forward to PCP.  Tillman Sers, DO PGY-2, [facility]

## 2018-04-23 NOTE — Telephone Encounter (Signed)
CT scheduled for today at 3:30 @ [facility], . Pt will need to be there at 1:00 to drink contrast. Pt has been notified with good understanding.

## 2018-04-23 NOTE — Telephone Encounter (Signed)
Called and discussed results.  Told her about the fluid around her gallbladder.  Likely not significant but if pain continues will obtain US.  She expressed understanding and appreciation of call.

## 2018-04-24 ENCOUNTER — Telehealth: Payer: Self-pay | Admitting: Family Medicine

## 2018-04-24 NOTE — Telephone Encounter (Signed)
Pt has some questions about her bacterial infection in her stomach.  She needs advice about what she can eat.  She would like Dr Gwendolyn Grant to call her

## 2018-04-27 NOTE — Telephone Encounter (Signed)
Pt calling back because she still has questions for Dr. Gwendolyn Grant. Pt said since she's been taking the medications for the bacteria infection in her stomach her tongue has changed color. She said her tongue is extremely brown. Please call pt to discuss this.

## 2018-04-28 ENCOUNTER — Encounter: Payer: Self-pay | Admitting: Family Medicine

## 2018-04-28 NOTE — Telephone Encounter (Signed)
Called and spoke with Lisa Crosby.  Still having pain about 30 minutes after eating when she eats any solid foods.  Right now subsisting on chicken noodle soup, yogurt, fluids.  Sharp stabbing pain in abdomen after meals.  CT scan negative for any pathology except for fluid around gallbladder.    She would like to hold off on any [place] of gallbladder unless the pain persists to next week. Will call and let me know at that time.   Pain not worse, but not really any better.  No N/V.    Reassured her re: brown tongue.  Should resolve s/p abx.

## 2018-04-29 ENCOUNTER — Telehealth: Payer: Self-pay | Admitting: Family Medicine

## 2018-04-29 ENCOUNTER — Inpatient Hospital Stay (HOSPITAL_COMMUNITY)
Admission: EM | Admit: 2018-04-29 | Discharge: 2018-05-01 | DRG: 419 | Disposition: A | Discharge status: Home / Self Care | Payer: Medicaid Other | Attending: General Surgery | Admitting: General Surgery

## 2018-04-29 ENCOUNTER — Emergency Department (HOSPITAL_COMMUNITY): Payer: Medicaid Other

## 2018-04-29 ENCOUNTER — Encounter: Payer: Self-pay | Admitting: Family Medicine

## 2018-04-29 ENCOUNTER — Other Ambulatory Visit: Payer: Self-pay

## 2018-04-29 ENCOUNTER — Encounter (HOSPITAL_COMMUNITY): Payer: Self-pay | Admitting: Emergency Medicine

## 2018-04-29 DIAGNOSIS — F17210 Nicotine dependence, cigarettes, uncomplicated: Secondary | ICD-10-CM | POA: Diagnosis present

## 2018-04-29 DIAGNOSIS — I10 Essential (primary) hypertension: Secondary | ICD-10-CM | POA: Diagnosis present

## 2018-04-29 DIAGNOSIS — Z6835 Body mass index (BMI) 35.0-35.9, adult: Secondary | ICD-10-CM | POA: Diagnosis not present

## 2018-04-29 DIAGNOSIS — R1011 Right upper quadrant pain: Secondary | ICD-10-CM | POA: Diagnosis not present

## 2018-04-29 DIAGNOSIS — E669 Obesity, unspecified: Secondary | ICD-10-CM | POA: Diagnosis present

## 2018-04-29 DIAGNOSIS — Z419 Encounter for procedure for purposes other than remedying health state, unspecified: Secondary | ICD-10-CM

## 2018-04-29 DIAGNOSIS — K8000 Calculus of gallbladder with acute cholecystitis without obstruction: Principal | ICD-10-CM | POA: Diagnosis present

## 2018-04-29 DIAGNOSIS — K8020 Calculus of gallbladder without cholecystitis without obstruction: Secondary | ICD-10-CM

## 2018-04-29 DIAGNOSIS — K8010 Calculus of gallbladder with chronic cholecystitis without obstruction: Secondary | ICD-10-CM | POA: Diagnosis present

## 2018-04-29 LAB — COMPREHENSIVE METABOLIC PANEL
ALBUMIN: 3.8 g/dL (ref 3.5–5.0)
ALK PHOS: 80 U/L (ref 38–126)
ALT: 15 U/L (ref 14–54)
AST: 20 U/L (ref 15–41)
Anion gap: 9 (ref 5–15)
BUN: 6 mg/dL (ref 6–20)
CHLORIDE: 107 mmol/L (ref 101–111)
CO2: 21 mmol/L — AB (ref 22–32)
CREATININE: 1.04 mg/dL — AB (ref 0.44–1.00)
Calcium: 9.3 mg/dL (ref 8.9–10.3)
GFR calc non Af Amer: >60 mL/min (ref >60)
Glucose, Bld: 106 mg/dL — ABNORMAL HIGH (ref 65–99)
Potassium: 4.1 mmol/L (ref 3.5–5.1)
SODIUM: 137 mmol/L (ref 135–145)
Total Bilirubin: 0.5 mg/dL (ref 0.3–1.2)
Total Protein: 7.2 g/dL (ref 6.5–8.1)

## 2018-04-29 LAB — CBC
HEMATOCRIT: 42.2 % (ref 36.0–46.0)
HEMOGLOBIN: 13.9 g/dL (ref 12.0–15.0)
MCH: 29 pg (ref 26.0–34.0)
MCHC: 32.9 g/dL (ref 30.0–36.0)
MCV: 88.1 fL (ref 78.0–100.0)
Platelets: 403 10*3/uL — ABNORMAL HIGH (ref 150–400)
RBC: 4.79 MIL/uL (ref 3.87–5.11)
RDW: 12.7 % (ref 11.5–15.5)
WBC: 6.9 10*3/uL (ref 4.0–10.5)

## 2018-04-29 LAB — I-STAT TROPONIN, ED: Troponin i, poc: 0 ng/mL (ref 0.00–0.08)

## 2018-04-29 LAB — I-STAT BETA HCG BLOOD, ED (MC, WL, AP ONLY)

## 2018-04-29 LAB — LIPASE, BLOOD: LIPASE: 46 U/L (ref 11–51)

## 2018-04-29 MED ORDER — ONDANSETRON HCL 4 MG/2ML IJ SOLN
4.0000 mg | Freq: Four times a day (QID) | INTRAMUSCULAR | Status: DC | PRN
  Administered 2018-04-30: 4 mg via INTRAVENOUS

## 2018-04-29 MED ORDER — MORPHINE SULFATE (PF) 4 MG/ML IV SOLN
2.0000 mg | INTRAVENOUS | Status: DC | PRN
  Administered 2018-04-29 – 2018-04-30 (×3): 2 mg via INTRAVENOUS
  Filled 2018-04-29 (×3): qty 1

## 2018-04-29 MED ORDER — NICOTINE 14 MG/24HR TD PT24
14.0000 mg | MEDICATED_PATCH | Freq: Once | TRANSDERMAL | Status: AC
  Administered 2018-04-29: 14 mg via TRANSDERMAL
  Filled 2018-04-29: qty 1

## 2018-04-29 MED ORDER — ONDANSETRON 4 MG PO TBDP: 4.0000 mg | ORAL_TABLET | Freq: Four times a day (QID) | ORAL | Status: DC | PRN

## 2018-04-29 NOTE — Telephone Encounter (Signed)
Pt wanted dr Gwendolyn Grant to know shew was in the ED with chest pains

## 2018-04-29 NOTE — ED Notes (Signed)
Patient transported to Ultrasound 
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
## 2018-04-29 NOTE — ED Notes (Addendum)
Pt refuses xray/VS

## 2018-04-29 NOTE — ED Triage Notes (Signed)
Pt states chest pain to central/right breast starting 1 hour ago. States she has H Pylori and has been taking a medication for 1 week and states she had a CT that showed fluid around gallbladder
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
## 2018-04-30 ENCOUNTER — Encounter (HOSPITAL_COMMUNITY): Admission: EM | Disposition: A | Discharge status: Home / Self Care | Payer: Self-pay | Source: Home / Self Care

## 2018-04-30 ENCOUNTER — Encounter (HOSPITAL_COMMUNITY): Payer: Self-pay | Admitting: Certified Registered Nurse Anesthetist

## 2018-04-30 ENCOUNTER — Inpatient Hospital Stay (HOSPITAL_COMMUNITY): Payer: Medicaid Other | Admitting: Certified Registered Nurse Anesthetist

## 2018-04-30 ENCOUNTER — Inpatient Hospital Stay (HOSPITAL_COMMUNITY): Payer: Medicaid Other

## 2018-04-30 HISTORY — PX: CHOLECYSTECTOMY: SHX55

## 2018-04-30 LAB — COMPREHENSIVE METABOLIC PANEL
ALBUMIN: 3.5 g/dL (ref 3.5–5.0)
ALT: 14 U/L (ref 14–54)
ANION GAP: 7 (ref 5–15)
AST: 16 U/L (ref 15–41)
Alkaline Phosphatase: 74 U/L (ref 38–126)
BILIRUBIN TOTAL: 0.5 mg/dL (ref 0.3–1.2)
BUN: 5 mg/dL — ABNORMAL LOW (ref 6–20)
CHLORIDE: 107 mmol/L (ref 101–111)
CO2: 25 mmol/L (ref 22–32)
Calcium: 8.9 mg/dL (ref 8.9–10.3)
Creatinine, Ser: 0.99 mg/dL (ref 0.44–1.00)
GFR calc Af Amer: >60 mL/min (ref >60)
GFR calc non Af Amer: >60 mL/min (ref >60)
GLUCOSE: 94 mg/dL (ref 65–99)
POTASSIUM: 4 mmol/L (ref 3.5–5.1)
SODIUM: 139 mmol/L (ref 135–145)
Total Protein: 6.6 g/dL (ref 6.5–8.1)

## 2018-04-30 LAB — CBC
HEMATOCRIT: 39 % (ref 36.0–46.0)
HEMOGLOBIN: 12.9 g/dL (ref 12.0–15.0)
MCH: 29.4 pg (ref 26.0–34.0)
MCHC: 33.1 g/dL (ref 30.0–36.0)
MCV: 88.8 fL (ref 78.0–100.0)
Platelets: 356 10*3/uL (ref 150–400)
RBC: 4.39 MIL/uL (ref 3.87–5.11)
RDW: 12.6 % (ref 11.5–15.5)
WBC: 7.5 10*3/uL (ref 4.0–10.5)

## 2018-04-30 LAB — SURGICAL PCR SCREEN
MRSA, PCR: POSITIVE — AB
STAPHYLOCOCCUS AUREUS: POSITIVE — AB

## 2018-04-30 SURGERY — LAPAROSCOPIC CHOLECYSTECTOMY WITH INTRAOPERATIVE CHOLANGIOGRAM
Anesthesia: General | Site: Abdomen

## 2018-04-30 MED ORDER — SUGAMMADEX SODIUM 200 MG/2ML IV SOLN
INTRAVENOUS | Status: AC
  Filled 2018-04-30: qty 2

## 2018-04-30 MED ORDER — LORAZEPAM 1 MG PO TABS
1.0000 mg | ORAL_TABLET | Freq: Two times a day (BID) | ORAL | Status: DC | PRN
  Administered 2018-05-01: 1 mg via ORAL
  Filled 2018-04-30: qty 1

## 2018-04-30 MED ORDER — AMLODIPINE BESYLATE 10 MG PO TABS
10.0000 mg | ORAL_TABLET | Freq: Every day | ORAL | Status: DC
  Administered 2018-04-30: 10 mg via ORAL
  Filled 2018-04-30 (×2): qty 1

## 2018-04-30 MED ORDER — PANTOPRAZOLE SODIUM 40 MG PO TBEC: 40.0000 mg | DELAYED_RELEASE_TABLET | Freq: Every day | ORAL | Status: DC

## 2018-04-30 MED ORDER — ROCURONIUM BROMIDE 10 MG/ML (PF) SYRINGE
PREFILLED_SYRINGE | INTRAVENOUS | Status: AC
  Filled 2018-04-30: qty 15

## 2018-04-30 MED ORDER — SODIUM CHLORIDE 0.9 % IV SOLN
INTRAVENOUS | Status: DC | PRN
  Administered 2018-04-30: 5 mL

## 2018-04-30 MED ORDER — DEXAMETHASONE SODIUM PHOSPHATE 10 MG/ML IJ SOLN
INTRAMUSCULAR | Status: DC | PRN
  Administered 2018-04-30: 5 mg via INTRAVENOUS

## 2018-04-30 MED ORDER — BUPIVACAINE-EPINEPHRINE 0.25% -1:200000 IJ SOLN
INTRAMUSCULAR | Status: DC | PRN
  Administered 2018-04-30: 15 mL

## 2018-04-30 MED ORDER — LACTATED RINGERS IV SOLN
INTRAVENOUS | Status: DC
Start: 2018-04-30 — End: 2018-05-01
  Administered 2018-04-30: 11:00:00 via INTRAVENOUS

## 2018-04-30 MED ORDER — SODIUM CHLORIDE 0.9 % IV SOLN: 2.0000 g | INTRAVENOUS | Status: DC

## 2018-04-30 MED ORDER — ATOMOXETINE HCL 40 MG PO CAPS
40.0000 mg | ORAL_CAPSULE | Freq: Every day | ORAL | Status: DC
Start: 2018-04-30 — End: 2018-05-01
  Administered 2018-04-30 – 2018-05-01 (×2): 40 mg via ORAL
  Filled 2018-04-30 (×3): qty 1

## 2018-04-30 MED ORDER — KCL IN DEXTROSE-NACL 20-5-0.45 MEQ/L-%-% IV SOLN
INTRAVENOUS | Status: DC
  Administered 2018-04-30 – 2018-05-01 (×3): via INTRAVENOUS
  Filled 2018-04-30 (×2): qty 1000

## 2018-04-30 MED ORDER — MUPIROCIN 2 % EX OINT
1.0000 "application " | TOPICAL_OINTMENT | Freq: Two times a day (BID) | CUTANEOUS | Status: DC
  Administered 2018-04-30 – 2018-05-01 (×3): 1 via NASAL
  Filled 2018-04-30: qty 22

## 2018-04-30 MED ORDER — OXYCODONE HCL 5 MG/5ML PO SOLN: 5.0000 mg | Freq: Once | ORAL | Status: AC | PRN

## 2018-04-30 MED ORDER — CHLORTHALIDONE 25 MG PO TABS
25.0000 mg | ORAL_TABLET | Freq: Every day | ORAL | Status: DC
  Administered 2018-04-30 – 2018-05-01 (×2): 25 mg via ORAL
  Filled 2018-04-30 (×2): qty 1

## 2018-04-30 MED ORDER — ETONOGESTREL 68 MG ~~LOC~~ IMPL: 1.0000 | DRUG_IMPLANT | Freq: Once | SUBCUTANEOUS | Status: DC

## 2018-04-30 MED ORDER — ONDANSETRON HCL 4 MG/2ML IJ SOLN
INTRAMUSCULAR | Status: AC
  Filled 2018-04-30: qty 2

## 2018-04-30 MED ORDER — OXYCODONE HCL 5 MG PO TABS
5.0000 mg | ORAL_TABLET | Freq: Once | ORAL | Status: AC | PRN
  Administered 2018-04-30: 5 mg via ORAL

## 2018-04-30 MED ORDER — HYDROMORPHONE HCL 2 MG/ML IJ SOLN
0.5000 mg | INTRAMUSCULAR | Status: DC | PRN
  Administered 2018-04-30 (×2): 1 mg via INTRAVENOUS
  Filled 2018-04-30 (×2): qty 1

## 2018-04-30 MED ORDER — NICOTINE 14 MG/24HR TD PT24
14.0000 mg | MEDICATED_PATCH | Freq: Once | TRANSDERMAL | Status: DC
  Administered 2018-04-30: 14 mg via TRANSDERMAL
  Filled 2018-04-30: qty 1

## 2018-04-30 MED ORDER — FENTANYL CITRATE (PF) 250 MCG/5ML IJ SOLN
INTRAMUSCULAR | Status: DC | PRN
  Administered 2018-04-30 (×4): 50 ug via INTRAVENOUS
  Administered 2018-04-30: 150 ug via INTRAVENOUS
  Administered 2018-04-30: 50 ug via INTRAVENOUS

## 2018-04-30 MED ORDER — TRAMADOL HCL 50 MG PO TABS
50.0000 mg | ORAL_TABLET | Freq: Four times a day (QID) | ORAL | Status: DC
  Administered 2018-04-30 – 2018-05-01 (×4): 50 mg via ORAL
  Filled 2018-04-30 (×4): qty 1

## 2018-04-30 MED ORDER — DIPHENHYDRAMINE HCL 50 MG/ML IJ SOLN: 25.0000 mg | Freq: Four times a day (QID) | INTRAMUSCULAR | Status: DC | PRN

## 2018-04-30 MED ORDER — HYDROMORPHONE HCL 2 MG/ML IJ SOLN: 0.3000 mg | INTRAMUSCULAR | Status: DC | PRN

## 2018-04-30 MED ORDER — SODIUM CHLORIDE 0.9 % IV SOLN
2.0000 g | Freq: Once | INTRAVENOUS | Status: AC
  Administered 2018-04-30: 2 g via INTRAVENOUS
  Filled 2018-04-30: qty 20

## 2018-04-30 MED ORDER — ACETAMINOPHEN 500 MG PO TABS
1000.0000 mg | ORAL_TABLET | Freq: Three times a day (TID) | ORAL | Status: DC
  Administered 2018-04-30 – 2018-05-01 (×3): 1000 mg via ORAL
  Filled 2018-04-30 (×3): qty 2

## 2018-04-30 MED ORDER — CHLORHEXIDINE GLUCONATE CLOTH 2 % EX PADS
6.0000 | MEDICATED_PAD | Freq: Every day | CUTANEOUS | Status: DC
  Administered 2018-04-30: 6 via TOPICAL

## 2018-04-30 MED ORDER — PROPOFOL 10 MG/ML IV BOLUS
INTRAVENOUS | Status: DC | PRN
  Administered 2018-04-30: 160 mg via INTRAVENOUS
  Administered 2018-04-30: 40 mg via INTRAVENOUS

## 2018-04-30 MED ORDER — LIDOCAINE 2% (20 MG/ML) 5 ML SYRINGE
INTRAMUSCULAR | Status: DC | PRN
  Administered 2018-04-30: 100 mg via INTRAVENOUS

## 2018-04-30 MED ORDER — SUGAMMADEX SODIUM 200 MG/2ML IV SOLN
INTRAVENOUS | Status: DC | PRN
  Administered 2018-04-30: 200 mg via INTRAVENOUS

## 2018-04-30 MED ORDER — OXYCODONE HCL 5 MG PO TABS
5.0000 mg | ORAL_TABLET | ORAL | Status: DC | PRN
  Filled 2018-04-30: qty 1

## 2018-04-30 MED ORDER — ENOXAPARIN SODIUM 40 MG/0.4ML ~~LOC~~ SOLN
40.0000 mg | SUBCUTANEOUS | Status: DC
  Filled 2018-04-30: qty 0.4

## 2018-04-30 MED ORDER — PROPOFOL 10 MG/ML IV BOLUS
INTRAVENOUS | Status: AC
  Filled 2018-04-30: qty 20

## 2018-04-30 MED ORDER — SODIUM CHLORIDE 0.9 % IR SOLN
Status: DC | PRN
  Administered 2018-04-30: 1000 mL

## 2018-04-30 MED ORDER — ACETAMINOPHEN 650 MG RE SUPP: 650.0000 mg | Freq: Four times a day (QID) | RECTAL | Status: DC | PRN

## 2018-04-30 MED ORDER — MIDAZOLAM HCL 2 MG/2ML IJ SOLN
INTRAMUSCULAR | Status: AC
  Filled 2018-04-30: qty 2

## 2018-04-30 MED ORDER — PROMETHAZINE HCL 25 MG/ML IJ SOLN: 6.2500 mg | INTRAMUSCULAR | Status: DC | PRN

## 2018-04-30 MED ORDER — DIPHENHYDRAMINE HCL 25 MG PO CAPS: 25.0000 mg | ORAL_CAPSULE | Freq: Four times a day (QID) | ORAL | Status: DC | PRN

## 2018-04-30 MED ORDER — 0.9 % SODIUM CHLORIDE (POUR BTL) OPTIME
TOPICAL | Status: DC | PRN
  Administered 2018-04-30: 1000 mL

## 2018-04-30 MED ORDER — FENTANYL CITRATE (PF) 250 MCG/5ML IJ SOLN
INTRAMUSCULAR | Status: AC
  Filled 2018-04-30: qty 5

## 2018-04-30 MED ORDER — WHITE PETROLATUM EX OINT
TOPICAL_OINTMENT | CUTANEOUS | Status: AC
  Filled 2018-04-30: qty 28.35

## 2018-04-30 MED ORDER — LIDOCAINE 2% (20 MG/ML) 5 ML SYRINGE
INTRAMUSCULAR | Status: AC
  Filled 2018-04-30: qty 20

## 2018-04-30 MED ORDER — METOPROLOL TARTRATE 5 MG/5ML IV SOLN: 5.0000 mg | Freq: Four times a day (QID) | INTRAVENOUS | Status: DC | PRN

## 2018-04-30 MED ORDER — DEXAMETHASONE SODIUM PHOSPHATE 10 MG/ML IJ SOLN
INTRAMUSCULAR | Status: AC
  Filled 2018-04-30: qty 1

## 2018-04-30 MED ORDER — IOPAMIDOL (ISOVUE-300) INJECTION 61%
INTRAVENOUS | Status: AC
  Filled 2018-04-30: qty 50

## 2018-04-30 MED ORDER — MIDAZOLAM HCL 2 MG/2ML IJ SOLN
INTRAMUSCULAR | Status: DC | PRN
  Administered 2018-04-30: 2 mg via INTRAVENOUS

## 2018-04-30 MED ORDER — OXYCODONE HCL 5 MG PO TABS
5.0000 mg | ORAL_TABLET | ORAL | Status: DC | PRN
  Administered 2018-04-30 – 2018-05-01 (×6): 5 mg via ORAL
  Filled 2018-04-30 (×5): qty 1

## 2018-04-30 MED ORDER — ACETAMINOPHEN 325 MG PO TABS
650.0000 mg | ORAL_TABLET | Freq: Four times a day (QID) | ORAL | Status: DC | PRN
  Administered 2018-04-30: 650 mg via ORAL
  Filled 2018-04-30: qty 2

## 2018-04-30 MED ORDER — BUPIVACAINE-EPINEPHRINE (PF) 0.25% -1:200000 IJ SOLN
INTRAMUSCULAR | Status: AC
  Filled 2018-04-30: qty 30

## 2018-04-30 MED ORDER — ROCURONIUM BROMIDE 10 MG/ML (PF) SYRINGE
PREFILLED_SYRINGE | INTRAVENOUS | Status: DC | PRN
  Administered 2018-04-30: 10 mg via INTRAVENOUS
  Administered 2018-04-30: 50 mg via INTRAVENOUS

## 2018-04-30 MED ORDER — OXYCODONE HCL 5 MG PO TABS
ORAL_TABLET | ORAL | Status: AC
  Filled 2018-04-30: qty 1

## 2018-04-30 MED ORDER — BOOST / RESOURCE BREEZE PO LIQD CUSTOM
1.0000 | Freq: Three times a day (TID) | ORAL | Status: DC
Start: 2018-04-30 — End: 2018-05-01
  Administered 2018-04-30: 1 via ORAL

## 2018-04-30 MED ORDER — KCL IN DEXTROSE-NACL 20-5-0.45 MEQ/L-%-% IV SOLN
INTRAVENOUS | Status: DC
  Administered 2018-04-30: 01:00:00 via INTRAVENOUS
  Filled 2018-04-30: qty 1000

## 2018-04-30 SURGICAL SUPPLY — 43 items
ADH SKN CLS APL DERMABOND .7 (GAUZE/BANDAGES/DRESSINGS) ×1
APPLIER CLIP 5 13 M/L LIGAMAX5 (MISCELLANEOUS) ×2
APR CLP MED LRG 5 ANG JAW (MISCELLANEOUS) ×1
BAG SPEC RTRVL 10 TROC 200 (ENDOMECHANICALS) ×1
CANISTER SUCT 3000ML PPV (MISCELLANEOUS) ×2 IMPLANT
CHLORAPREP W/TINT 26ML (MISCELLANEOUS) ×2 IMPLANT
CLIP APPLIE 5 13 M/L LIGAMAX5 (MISCELLANEOUS) ×1 IMPLANT
CLSR STERI-STRIP ANTIMIC 1/2X4 (GAUZE/BANDAGES/DRESSINGS) ×1 IMPLANT
COVER MAYO STAND STRL (DRAPES) IMPLANT
COVER SURGICAL LIGHT HANDLE (MISCELLANEOUS) ×2 IMPLANT
DERMABOND ADVANCED (GAUZE/BANDAGES/DRESSINGS) ×1
DERMABOND ADVANCED .7 DNX12 (GAUZE/BANDAGES/DRESSINGS) ×1 IMPLANT
DRAPE C-ARM 42X72 X-RAY (DRAPES) ×1 IMPLANT
DRSG TEGADERM 2-3/8X2-3/4 SM (GAUZE/BANDAGES/DRESSINGS) ×8 IMPLANT
DRSG TEGADERM 4X4.75 (GAUZE/BANDAGES/DRESSINGS) ×1 IMPLANT
ELECT REM PT RETURN 9FT ADLT (ELECTROSURGICAL) ×2
ELECTRODE REM PT RTRN 9FT ADLT (ELECTROSURGICAL) ×1 IMPLANT
GLOVE BIO SURGEON STRL SZ 6.5 (GLOVE) ×1 IMPLANT
GLOVE BIOGEL PI IND STRL 8 (GLOVE) ×1 IMPLANT
GLOVE BIOGEL PI INDICATOR 8 (GLOVE) ×1
GLOVE ECLIPSE 7.5 STRL STRAW (GLOVE) ×2 IMPLANT
GOWN STRL REUS W/ TWL LRG LVL3 (GOWN DISPOSABLE) ×3 IMPLANT
GOWN STRL REUS W/TWL LRG LVL3 (GOWN DISPOSABLE) ×6
KIT BASIN OR (CUSTOM PROCEDURE TRAY) ×2 IMPLANT
KIT TURNOVER KIT B (KITS) ×2 IMPLANT
NS IRRIG 1000ML POUR BTL (IV SOLUTION) ×2 IMPLANT
PAD ARMBOARD 7.5X6 YLW CONV (MISCELLANEOUS) ×2 IMPLANT
POUCH RETRIEVAL ECOSAC 10 (ENDOMECHANICALS) ×1 IMPLANT
POUCH RETRIEVAL ECOSAC 10MM (ENDOMECHANICALS) ×1
SCISSORS LAP 5X35 DISP (ENDOMECHANICALS) ×2 IMPLANT
SET CHOLANGIOGRAPH 5 50 .035 (SET/KITS/TRAYS/PACK) ×1 IMPLANT
SET IRRIG TUBING LAPAROSCOPIC (IRRIGATION / IRRIGATOR) ×2 IMPLANT
SLEEVE ENDOPATH XCEL 5M (ENDOMECHANICALS) ×4 IMPLANT
SPECIMEN JAR SMALL (MISCELLANEOUS) ×2 IMPLANT
STRIP CLOSURE SKIN 1/2X4 (GAUZE/BANDAGES/DRESSINGS) ×2 IMPLANT
SUT MNCRL AB 4-0 PS2 18 (SUTURE) ×2 IMPLANT
TOWEL OR 17X24 6PK STRL BLUE (TOWEL DISPOSABLE) ×2 IMPLANT
TOWEL OR 17X26 10 PK STRL BLUE (TOWEL DISPOSABLE) ×2 IMPLANT
TRAY LAPAROSCOPIC MC (CUSTOM PROCEDURE TRAY) ×2 IMPLANT
TROCAR XCEL BLUNT TIP 100MML (ENDOMECHANICALS) ×2 IMPLANT
TROCAR XCEL NON-BLD 5MMX100MML (ENDOMECHANICALS) ×2 IMPLANT
TUBING INSUFFLATION (TUBING) ×2 IMPLANT
WATER STERILE IRR 1000ML POUR (IV SOLUTION) ×2 IMPLANT

## 2018-04-30 NOTE — Telephone Encounter (Signed)
See my chart message

## 2018-04-30 NOTE — Transfer of Care (Signed)
Immediate Anesthesia Transfer of Care Note  Patient: Lisa Crosby  Procedure(s) Performed: LAPAROSCOPIC CHOLECYSTECTOMY WITH INTRAOPERATIVE CHOLANGIOGRAM (N/A Abdomen)  Patient Location: PACU  Anesthesia Type:General  Level of Consciousness: awake, alert  and patient cooperative  Airway & Oxygen Therapy: Patient Spontanous Breathing  Post-op Assessment: Report given to RN and Post -op Vital signs reviewed and stable  Post vital signs: Reviewed and stable  Last Vitals:  Vitals Value Taken Time  BP 115/84 04/30/2018  1:17 PM  Temp    Pulse 73 04/30/2018  1:18 PM  Resp 15 04/30/2018  1:18 PM  SpO2 94 % 04/30/2018  1:18 PM  Vitals shown include unvalidated device data.  Last Pain:  Vitals:   04/30/18 0726  TempSrc:   PainSc: 8          Complications: No apparent anesthesia complications

## 2018-04-30 NOTE — Progress Notes (Addendum)
Initial Nutrition Assessment  DOCUMENTATION CODES:   Severe malnutrition in context of acute illness/injury, Obesity unspecified  INTERVENTION:    Boost Breeze po TID, each supplement provides 250 kcal and 9 grams of protein  NUTRITION DIAGNOSIS:   Severe Malnutrition related to acute illness(symptomatic cholelithiasis ) as evidenced by energy intake < or equal to 50% for > or equal to 5 days, percent weight loss (9% x 2 months)  GOAL:   Patient will meet greater than or equal to 90% of their needs  MONITOR:   PO intake, Supplement acceptance, Labs, Skin, I & O's  REASON FOR ASSESSMENT:   Malnutrition Screening Tool  ASSESSMENT:   37 yo Female who presented to ED with complaints of abdominal pain. CT showed calculus of gallbladder without cholecystitis without obstruction.   Pt s/p procedure 5/23: LAPAROSCOPIC CHOLECYSTECTOMY WITH INTRAOPERATIVE CHOLANGIOGRAM  Pt resting in bed upon RD visit. She states "I'm so hungry". 1 week PTA pt was able to tolerate chicken noodle soup, yogurt and crackers. She wanted to eat more however the stomach pain after eating was too painful.  2 months PTA she was only consuming 1 meal per day (usually breakfast). Pt endorses a 20 lb unintentional weight loss (9%) during the 2 months. Severe for time frame. She shares her clothes are "hanging off me" and her face and legs are significantly smaller in size.  Pt amenable to Boost Breeze clear liquid supplement. RD brought pt cherry [place] [employer] per request. Labs and medications reviewed.  NUTRITION - FOCUSED PHYSICAL EXAM:  Completed. No muscle or fat loss noticed.  Diet Order:   Diet Order           Diet clear liquid Room service appropriate? Yes; Fluid consistency: Thin  Diet effective now         EDUCATION NEEDS:   No education needs have been identified at this time  Skin:  Skin Assessment: Reviewed RN Assessment  Last BM:  5/22   Height:   Ht Readings from Last 1  Encounters:  04/29/18  (1.626 m)   Weight:   Wt Readings from Last 1 Encounters:  04/29/18 207 lb 1.6 oz (93.9 kg)   Ideal Body Weight:  54.5 kg  BMI:  Body mass index is 35.55 kg/m.  Estimated Nutritional Needs:   Kcal:  1800-2000  Protein:  90-105 gm  Fluid:  1.8-2.0 L  Maureen Chatters, RD, LDN Pager #: [phone] After-Hours Pager #: [phone]

## 2018-04-30 NOTE — Anesthesia Preprocedure Evaluation (Addendum)
Anesthesia Evaluation  Patient identified by MRN, date of birth, ID band Patient awake    Reviewed: Allergy & Precautions, NPO status , Patient's Chart, lab work & pertinent test results  Airway Mallampati: III  TM Distance: >3 FB Neck ROM: Full    Dental no notable dental hx.    Pulmonary Current Smoker,    Pulmonary exam normal breath sounds clear to auscultation       Cardiovascular hypertension, Pt. on medications Normal cardiovascular exam Rhythm:Regular Rate:Normal  ECG: NSR, LPFB, rate 78   Neuro/Psych PSYCHIATRIC DISORDERS Anxiety Depression negative neurological ROS     GI/Hepatic negative GI ROS, Neg liver ROS,   Endo/Other  negative endocrine ROS  Renal/GU negative Renal ROS     Musculoskeletal negative musculoskeletal ROS (+)   Abdominal (+) + obese,   Peds  Hematology negative hematology ROS (+)   Anesthesia Other Findings Symptomatic cholelithiasis  Reproductive/Obstetrics hcg negative                            Anesthesia Physical Anesthesia Plan  ASA: II  Anesthesia Plan: General   Post-op Pain Management:    Induction: Intravenous  PONV Risk Score and Plan: 3 and Midazolam, Dexamethasone, Ondansetron and Treatment may vary due to age or medical condition  Airway Management Planned: Oral ETT  Additional Equipment:   Intra-op Plan:   Post-operative Plan: Extubation in OR  Informed Consent: I have reviewed the patients History and Physical, chart, labs and discussed the procedure including the risks, benefits and alternatives for the proposed anesthesia with the patient or authorized representative who has indicated his/her understanding and acceptance.   Dental advisory given  Plan Discussed with: CRNA  Anesthesia Plan Comments:         Anesthesia Quick Evaluation

## 2018-04-30 NOTE — Telephone Encounter (Signed)
Pt called from hospital.she is having gall bladder surgery today. She wanted dr Gwendolyn Grant to know. She still wants to talk to him

## 2018-04-30 NOTE — Progress Notes (Signed)
Patient arrived to unit. Alert and oriented x4. Report received from [place], Charity fundraiser. Informed Consent signed. MRSA pcr sent to lab.

## 2018-04-30 NOTE — Op Note (Signed)
OPERATIVE REPORT  DATE OF OPERATION: 04/30/2018  PATIENT:  Lisa Crosby  37 y.o. female  PRE-OPERATIVE DIAGNOSIS:  Symptomatic cholelithiasis  POST-OPERATIVE DIAGNOSIS:  Symptomatic cholelithiasis   INDICATION(S) FOR OPERATION:  Acute cholecystitis  FINDINGS:  Adhesions indicative aof acute inflammatory cholecystitis  PROCEDURE:  Procedure(s): LAPAROSCOPIC CHOLECYSTECTOMY WITH INTRAOPERATIVE CHOLANGIOGRAM  SURGEON:  Surgeon(s): Jimmye Norman, MD  ASSISTANT: Marlyne Beards, PA-C  ANESTHESIA:   general  COMPLICATIONS:  None  EBL: 20 ml  BLOOD ADMINISTERED: none  DRAINS: none   SPECIMEN:  Source of Specimen:  gallbladder and contents  COUNTS CORRECT:  YES  PROCEDURE DETAILS: The patient was taken to the operating room and placed on the table in the supine position.  After an adequate endotracheal anesthetic was administered, the patient was prepped with ChloroPrep, and then draped in the usual manner exposing the entire abdomen laterally, inferiorly and up  to the costal margins.  After a proper timeout was performed including identifying the patient and the procedure to be performed, a supraumbilical 1.5cm midline incision was made using a #15 blade.  This was taken down to the fascia which was then incised with a #15 blade.  The edges of the fascia were tented up with Kocher clamps as the preperitoneal space was penetrated with a Kelly clamp into the peritoneum.  Once this was done, a pursestring suture of 0 Vicryl was passed around the fascial opening.  This was subsequently used to secure the [facility] cannula which was passed into the peritoneal cavity.  Once the [facility] cannula was in place, carbon dioxide gas was insufflated into the peritoneal cavity up to a maximal intra-abdominal pressure of 15mm Hg.The laparoscope, with attached camera and light source, was passed into the peritoneal cavity to visualize the direct insertion of two right upper quadrant 5mm cannulas, and a  sup-xiphoid 5mm cannula.  Once all cannulas were in place, the dissection was begun.  Two ratcheted graspers were attached to the dome and infundibulum of the gallbladder and retracted towards the anterior abdominal wall and the right upper quadrant.  Using cautery attached to a dissecting forceps, the peritoneum overlaying the triangle of Chalot and the hepatoduodenal triangle was dissected away exposing the cystic duct and the cystic artery.  A critical window was developed between the CBD and the cystic duct The cystic artery was clipped proximally and distally then transected.  A clip was placed on the gallbladder side of the cystic duct, then a cholecystodochotomy made using the laparoscopic scissors.  Through the cholecystodochotomy a Cook catheter was passed to performed a cholangiogram.  The cholangiogram showed good flow into the duodenum, good proximal filling, not intraductal filling defects, no dilatation..  Once the cholangiogram was completed, the [facility] catheter was removed, and the distal cystic duct was clipped multiple times then transected between the clips.  The gallbladder was then dissected out of the hepatic bed without event.  It was retrieved from the abdomen using and EcoPouch bag without event.  Once the gallbladder was removed, the bed was inspected for hemostasis.  Once excellent hemostasis was obtained all gas and fluids were aspirated from above the liver, then the cannulas were removed.  The supraumbilical incision was closed using the pursestring suture which was in place.  0.25% bupivicaine with epinephrine was injected at all sites.  All 10mm or greater cannula sites were close using a running subcuticular stitch of 4-0 Monocryl.  5.88mm cannula sites were closed with Dermabond only.Steri-Strips and Tagaderm were used to complete the dressings  at all sites.  At this point all needle, sponge, and instrument counts were correct.The patient was awakened from anesthesia and  taken to the PACU in stable condition.  Marta Lamas. Gae Bon, MD, FACS [phone] [facility]  PATIENT DISPOSITION:  PACU - hemodynamically stable.   Jimmye Norman 5/23/20191:11 PM

## 2018-04-30 NOTE — Anesthesia Procedure Notes (Signed)
Procedure Name: Intubation Date/Time: 04/30/2018 12:00 PM Performed by: White, Amedeo Plenty, CRNA Pre-anesthesia Checklist: Patient identified, Emergency Drugs available, Suction available and Patient being monitored Patient Re-evaluated:Patient Re-evaluated prior to induction Oxygen Delivery Method: Circle System Utilized Preoxygenation: Pre-oxygenation with 100% oxygen Induction Type: IV induction Ventilation: Mask ventilation without difficulty Laryngoscope Size: Mac and 3 Grade View: Grade I Tube type: Oral Tube size: 7.0 mm Number of attempts: 1 Airway Equipment and Method: Stylet Placement Confirmation: ETT inserted through vocal cords under direct vision,  positive ETCO2 and breath sounds checked- equal and bilateral Secured at: 22 cm Tube secured with: Tape Dental Injury: Teeth and Oropharynx as per pre-operative assessment

## 2018-05-01 ENCOUNTER — Encounter (HOSPITAL_COMMUNITY): Payer: Self-pay | Admitting: General Surgery

## 2018-05-01 MED ORDER — OXYCODONE HCL 5 MG PO TABS: 10.0000 mg | ORAL_TABLET | ORAL | Status: DC | PRN

## 2018-05-01 MED ORDER — IBUPROFEN 200 MG PO TABS: 400.0000 mg | ORAL_TABLET | Freq: Four times a day (QID) | ORAL | 2 refills | Status: DC | PRN

## 2018-05-01 MED ORDER — AMOXICILLIN 500 MG PO TABS: 500.0000 mg | ORAL_TABLET | Freq: Two times a day (BID) | ORAL | 0 refills | Status: AC

## 2018-05-01 MED ORDER — PANTOPRAZOLE SODIUM 40 MG PO TBEC: 40.0000 mg | DELAYED_RELEASE_TABLET | Freq: Every day | ORAL | 0 refills | Status: DC

## 2018-05-01 MED ORDER — ACETAMINOPHEN 500 MG PO TABS: 1000.0000 mg | ORAL_TABLET | Freq: Three times a day (TID) | ORAL | 0 refills | Status: DC | PRN

## 2018-05-01 MED ORDER — CLARITHROMYCIN 500 MG PO TABS: 500.0000 mg | ORAL_TABLET | Freq: Two times a day (BID) | ORAL | 0 refills | Status: AC

## 2018-05-01 MED ORDER — OXYCODONE HCL 5 MG PO TABS: 5.0000 mg | ORAL_TABLET | Freq: Four times a day (QID) | ORAL | 0 refills | Status: DC | PRN

## 2018-05-01 NOTE — Progress Notes (Signed)
Pt is ambulating in the hall. Pain is controlled. Tolerating regular diet. Discharge instructions given to pt. Discharged to home accompanied by friends.

## 2018-05-01 NOTE — Discharge Instructions (Signed)

## 2018-05-01 NOTE — Care Management Note (Signed)
Case Management Note  Patient Details  Name: Lisa Crosby MRN: [id number] Date of Birth: [birth year]  Subjective/Objective:                    Action/Plan:  Went to patient's room to provide [facility] letter to assist her in getting prescriptions filled. Patient already discharged and has left. Prescriptions were sent to [facility] at [address] [place] , called same spoke to [place], provided MATCH ID, [employer]. Paul Dykes stated MATCH went through and he has prescriptions. When patient arrives co pay will be $3 per prescription.  Called patient's phone number [phone] , no answer and voicemail is full. Unable to leave message. Expected Discharge Date:  05/01/18               Expected Discharge Plan:  Home/Self Care  In-House Referral:     Discharge planning Services  CM Consult, MATCH Program, Medication Assistance  Post Acute Care Choice:    Choice offered to:     DME Arranged:  N/A DME Agency:     HH Arranged:  NA HH Agency:  NA  Status of Service:  Completed, signed off  If discussed at Long Length of Stay Meetings, dates discussed:    Additional Comments:  Kingsley Plan, RN 05/01/2018, 11:27 AM

## 2018-05-01 NOTE — Discharge Summary (Signed)
[facility] Discharge Summary   Patient ID: Lisa Crosby MRN: [id number] DOB/AGE: [birth year] 37 y.o.  Admit date: 04/29/2018 Discharge date: 05/01/2018  Admitting Diagnosis: Symptomatic cholelithiasis  Discharge Diagnosis Patient Active Problem List   Diagnosis Date Noted  . 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 Abdominal pain, epigastric 04/10/2018  . Attention deficit 01/07/2017  . Birth control counseling 12/29/2015  . Hemorrhoid 07/06/2015  . Generalized anxiety disorder 02/26/2013  . Obesity 01/06/2009  . TOBACCO ABUSE 01/06/2009  . HYPERTENSION, BENIGN 01/06/2009    Consultants None Imaging: Dg Cholangiogram Operative  Result Date: 04/30/2018 CLINICAL DATA:  37 year old female undergoing laparoscopic cholecystectomy with intraoperative cholangiogram. EXAM: INTRAOPERATIVE CHOLANGIOGRAM TECHNIQUE: Cholangiographic images from the C-arm fluoroscopic device were submitted for interpretation post-operatively. Please see the procedural report for the amount of contrast and the fluoroscopy time utilized. COMPARISON:  Right upper quadrant ultrasound 04/29/2018 FINDINGS: A single cine clip is submitted for evaluation. The images demonstrate cannulation of the cystic duct remanent and opacification of the biliary tree. No evidence of biliary ductal dilatation, stenosis, stricture or choledocholithiasis. Contrast passes freely through the ampulla and into the duodenum. IMPRESSION: Negative intraoperative cholangiogram. Electronically Signed   By: Malachy Moan M.D.   On: 04/30/2018 13:38   US Abdomen Limited Ruq  Result Date: 04/29/2018 CLINICAL DATA:  Right upper quadrant abdominal pain. EXAM: ULTRASOUND ABDOMEN LIMITED RIGHT UPPER QUADRANT COMPARISON:  CT of the abdomen on 04/23/2018 FINDINGS: Gallbladder: The gallbladder lumen is diffusely filled with shadowing gallstones. It is difficult to obtain an accurate measurement of gallbladder wall thickness. The  patient was apparently tender overlying the gallbladder during the examination. Common bile duct: Diameter: 3 mm. Liver: The liver demonstrates mildly increased echogenicity, likely reflecting steatosis. No overt cirrhotic contour abnormalities or focal lesions are identified. There is no evidence of intrahepatic biliary ductal dilatation. Portal vein is patent on color Doppler imaging with normal direction of blood flow towards the liver. IMPRESSION: 1. Cholelithiasis with multiple gallstones throughout the gallbladder lumen. Focal tenderness overlying the gallbladder during the ultrasound study. Acute cholecystitis cannot be excluded. 2. No evidence of biliary obstruction by ultrasound. 3. Mildly increased echogenicity of the liver likely reflecting hepatic steatosis. Electronically Signed   By: Irish Lack M.D.   On: 04/29/2018 19:24    Procedures Dr. Lindie Spruce (04/30/18) - Laparoscopic Cholecystectomy with [facility]  Hospital Course:  Patient is a 37 year old female who presented to [facility] with abdominal pain.  Workup showed symptomatic cholelithiasis.  Patient was admitted and underwent procedure listed above.  Tolerated procedure well and was transferred to the floor.  Diet was advanced as tolerated.  On POD#1, the patient was voiding well, tolerating diet, ambulating well, pain well controlled, vital signs stable, incisions c/d/i and felt stable for discharge home.  Patient will follow up in our office in 2 weeks and knows to call with questions or concerns. She will call to confirm appointment date/time.    Physical Exam: General:  Alert, NAD, pleasant, comfortable Abd:  Soft, ND, mild tenderness, incisions C/D/I   Allergies as of 05/01/2018      Reactions   Lisinopril-hydrochlorothiazide Swelling   Lip swelling (self-reported)   Lorazepam Itching   Per notes in Epic   Paxil [paroxetine Hcl] Itching   Shellfish Allergy Hives      Medication List    TAKE these medications   acetaminophen 500  MG tablet Commonly known as:  TYLENOL Take 2 tablets (1,000 mg total) by mouth every 8 (eight) hours  as needed for mild pain.   amLODipine 10 MG tablet Commonly known as:  NORVASC Take 1 tablet (10 mg total) by mouth daily.   amoxicillin 500 MG tablet Commonly known as:  AMOXIL Take 1 tablet (500 mg total) by mouth 2 (two) times daily for 14 days. What changed:  how much to take   atomoxetine 40 MG capsule Commonly known as:  STRATTERA TAKE 1 CAPSULE BY MOUTH EVERY DAY   chlorthalidone 25 MG tablet Commonly known as:  HYGROTON Take 1 tablet (25 mg total) by mouth daily.   clarithromycin 500 MG tablet Commonly known as:  BIAXIN Take 1 tablet (500 mg total) by mouth 2 (two) times daily for 14 days.   desonide 0.05 % cream Commonly known as:  DESOWEN APPLY TO AFFECTED AREA TWICE A DAY   etonogestrel 68 MG Impl implant Commonly known as:  NEXPLANON 1 each (68 mg total) by Subdermal route once.   ibuprofen 200 MG tablet Commonly known as:  MOTRIN IB Take 2 tablets (400 mg total) by mouth every 6 (six) hours as needed for mild pain.   LORazepam 1 MG tablet Commonly known as:  ATIVAN Take 1 tablet (1 mg total) by mouth 2 (two) times daily as needed for anxiety.   oxyCODONE 5 MG immediate release tablet Commonly known as:  Oxy IR/ROXICODONE Take 1 tablet (5 mg total) by mouth every 6 (six) hours as needed for moderate pain.   pantoprazole 40 MG tablet Commonly known as:  PROTONIX Take 1 tablet (40 mg total) by mouth daily.        Follow-up Information    [facility]. Go on 05/14/2018.   Specialty:  General Surgery Why:  Your appointment is scheduled for 3:30 PM. Please arrive 30 min prior to appointment time. Bring photo ID and insurance information.  Contact information: [address] [place] [address] [phone]           Signed: Wells Guiles, [facility] 05/01/2018, 10:43 AM Pager: [phone] Consults:  [phone] Mon-Fri 7:00 am-4:30 pm Sat-Sun 7:00 am-11:30 am

## 2018-05-02 ENCOUNTER — Encounter: Payer: Self-pay | Admitting: Family Medicine

## 2018-05-03 ENCOUNTER — Encounter: Payer: Self-pay | Admitting: Family Medicine

## 2018-05-04 ENCOUNTER — Telehealth: Payer: Self-pay | Admitting: Family Medicine

## 2018-05-04 NOTE — Anesthesia Postprocedure Evaluation (Signed)
Anesthesia Post Note  Patient: Lisa Crosby  Procedure(s) Performed: LAPAROSCOPIC CHOLECYSTECTOMY WITH INTRAOPERATIVE CHOLANGIOGRAM (N/A Abdomen)     Patient location during evaluation: PACU Anesthesia Type: General Level of consciousness: awake and alert Pain management: pain level controlled Vital Signs Assessment: post-procedure vital signs reviewed and stable Respiratory status: spontaneous breathing, nonlabored ventilation, respiratory function stable and patient connected to nasal cannula oxygen Cardiovascular status: blood pressure returned to baseline and stable Postop Assessment: no apparent nausea or vomiting Anesthetic complications: no    Last Vitals:  Vitals:   05/01/18 0602 05/01/18 0817  BP: 127/69 111/75  Pulse: 62 60  Resp:    Temp:    SpO2: 94% 94%    Last Pain:  Vitals:   05/01/18 0808  TempSrc:   PainSc: 7                  Ryan P Ellender

## 2018-05-04 NOTE — Telephone Encounter (Signed)
After Hours/Emergency Line Call  Received call from patient regarding pain at incision sites following laparoscopic cholecystectomy 04/30/2018.  Patient states she was discharged with oxycodone which did improve some of the pain.  She went to pick up her child in the park earlier today and felt a popping sensation and noticed that her incision site had opened up.  She does endorse some minimal bleeding near the incision but denies discharge or erythema around the incision site.  Patient denies fevers or chills.  She has continued taking the oxycodone but feels like it is not helping much.  Patient asking for additional pain medication over the phone.  No red flags.  Patient has follow-up appointment with surgeon in 1 week.  Scheduled for clinic appointment at 11:10 AM on 05/05/2018.  Reviewed return precautions.  Without further questions or concerns.  Will forward to PCP.  Durward Parcel, DO [facility], PGY-2

## 2018-05-05 ENCOUNTER — Ambulatory Visit: Payer: Medicaid Other | Admitting: Internal Medicine

## 2018-05-05 ENCOUNTER — Telehealth: Payer: Self-pay | Admitting: Family Medicine

## 2018-05-05 NOTE — Telephone Encounter (Signed)
Pt is calling to check on the status of her paper work that needs sent to [employer]. It is the paperwork that has her written out of work due to having her Gallbladder removed.

## 2018-05-06 NOTE — Telephone Encounter (Signed)
Completed.  See mychart note.

## 2018-05-06 NOTE — Telephone Encounter (Signed)
Called patient back and answered all questions.  She should be able to return to work on Sat June 1.  I have completed her FMLA and will place it in the to be faxed box.

## 2018-05-06 NOTE — Telephone Encounter (Signed)
Pt called again checking to see if Dr Gwendolyn Grant had filled her forms out. She wanted to let Dr Gwendolyn Grant know that if she doesn't have these forms completed asap that she may get denied for the excused time off at work. She also asked if Dr Gwendolyn Grant had any questions to please call her.

## 2018-05-06 NOTE — Telephone Encounter (Signed)
Pt just called back again and said for Dr Gwendolyn Grant to please call her before filling out the paper work. She wants to discuss possibly being released to work earlier than the doctor at the hospital wrote her out for.

## 2018-05-12 ENCOUNTER — Telehealth: Payer: Self-pay | Admitting: Family Medicine

## 2018-05-12 NOTE — Telephone Encounter (Signed)
Pt called and wanted to know about getting copies of her most recent visits concerning the infection in her stomach up to when she had her gallbladder removed. She needs them for insurance. She said she wasn't really sure exactly what she needed so she would like to be contacted by Dr Gwendolyn GrantWalden to figure out what she needs to do.

## 2018-05-13 NOTE — Telephone Encounter (Signed)
Called and spoke with Lisa Crosby.  Told her we had faxed paperwork last Wed.  I went and found her paperwork in the fax pile.  It was confirmed sent.  She will be here tomorrow with her daughter and plans to pick it up and let me know if there is anymore information she needs.  Appreciated call.  Unsure if she needs surgery/other notes as mentioned above.

## 2018-09-22 ENCOUNTER — Other Ambulatory Visit: Payer: Self-pay

## 2019-01-13 ENCOUNTER — Telehealth (HOSPITAL_COMMUNITY): Payer: Self-pay | Admitting: Emergency Medicine

## 2019-01-13 ENCOUNTER — Ambulatory Visit (HOSPITAL_COMMUNITY)
Admission: EM | Admit: 2019-01-13 | Discharge: 2019-01-13 | Disposition: A | Discharge status: Home / Self Care | Payer: Medicaid Other | Attending: Family Medicine | Admitting: Family Medicine

## 2019-01-13 ENCOUNTER — Encounter (HOSPITAL_COMMUNITY): Payer: Self-pay | Admitting: Emergency Medicine

## 2019-01-13 DIAGNOSIS — L02411 Cutaneous abscess of right axilla: Secondary | ICD-10-CM | POA: Diagnosis present

## 2019-01-13 HISTORY — DX: Cutaneous abscess of right axilla: L02.411

## 2019-01-13 MED ORDER — HYDROCODONE-ACETAMINOPHEN 5-325 MG PO TABS: 2.0000 | ORAL_TABLET | ORAL | 0 refills | Status: DC | PRN

## 2019-01-13 MED ORDER — DOXYCYCLINE HYCLATE 100 MG PO CAPS: 100.0000 mg | ORAL_CAPSULE | Freq: Two times a day (BID) | ORAL | 0 refills | Status: DC

## 2019-01-13 NOTE — ED Triage Notes (Signed)
[facility] to right armp[facility].
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
## 2019-01-15 ENCOUNTER — Other Ambulatory Visit: Payer: Self-pay | Admitting: Family Medicine

## 2019-01-27 ENCOUNTER — Emergency Department (HOSPITAL_COMMUNITY): Payer: BLUE CROSS/BLUE SHIELD

## 2019-01-27 ENCOUNTER — Emergency Department (HOSPITAL_COMMUNITY)
Admission: EM | Admit: 2019-01-27 | Discharge: 2019-01-27 | Disposition: A | Discharge status: Home / Self Care | Payer: BLUE CROSS/BLUE SHIELD | Attending: Emergency Medicine | Admitting: Emergency Medicine

## 2019-01-27 DIAGNOSIS — R200 Anesthesia of skin: Secondary | ICD-10-CM | POA: Diagnosis not present

## 2019-01-27 DIAGNOSIS — Z4800 Encounter for change or removal of nonsurgical wound dressing: Secondary | ICD-10-CM | POA: Diagnosis not present

## 2019-01-27 DIAGNOSIS — Z79899 Other long term (current) drug therapy: Secondary | ICD-10-CM | POA: Diagnosis not present

## 2019-01-27 DIAGNOSIS — L02412 Cutaneous abscess of left axilla: Secondary | ICD-10-CM | POA: Insufficient documentation

## 2019-01-27 DIAGNOSIS — I10 Essential (primary) hypertension: Secondary | ICD-10-CM | POA: Diagnosis not present

## 2019-01-27 DIAGNOSIS — R29818 Other symptoms and signs involving the nervous system: Secondary | ICD-10-CM | POA: Diagnosis not present

## 2019-01-27 DIAGNOSIS — Z5189 Encounter for other specified aftercare: Secondary | ICD-10-CM

## 2019-01-27 DIAGNOSIS — M79602 Pain in left arm: Secondary | ICD-10-CM | POA: Diagnosis not present

## 2019-01-27 DIAGNOSIS — F17210 Nicotine dependence, cigarettes, uncomplicated: Secondary | ICD-10-CM | POA: Diagnosis not present

## 2019-01-27 LAB — COMPREHENSIVE METABOLIC PANEL
ALT: 18 U/L (ref 0–44)
AST: 20 U/L (ref 15–41)
Albumin: 3.5 g/dL (ref 3.5–5.0)
Alkaline Phosphatase: 88 U/L (ref 38–126)
Anion gap: 8 (ref 5–15)
BUN: 11 mg/dL (ref 6–20)
CO2: 21 mmol/L — AB (ref 22–32)
Calcium: 9 mg/dL (ref 8.9–10.3)
Chloride: 106 mmol/L (ref 98–111)
Creatinine, Ser: 0.86 mg/dL (ref 0.44–1.00)
GFR calc Af Amer: >60 mL/min (ref >60)
GFR calc non Af Amer: >60 mL/min (ref >60)
Glucose, Bld: 93 mg/dL (ref 70–99)
Potassium: 4.1 mmol/L (ref 3.5–5.1)
Sodium: 135 mmol/L (ref 135–145)
Total Bilirubin: 0.4 mg/dL (ref 0.3–1.2)
Total Protein: 7 g/dL (ref 6.5–8.1)

## 2019-01-27 LAB — CBC
HCT: 42.6 % (ref 36.0–46.0)
HEMOGLOBIN: 13.6 g/dL (ref 12.0–15.0)
MCH: 29.2 pg (ref 26.0–34.0)
MCHC: 31.9 g/dL (ref 30.0–36.0)
MCV: 91.4 fL (ref 80.0–100.0)
Platelets: 414 10*3/uL — ABNORMAL HIGH (ref 150–400)
RBC: 4.66 MIL/uL (ref 3.87–5.11)
RDW: 13.5 % (ref 11.5–15.5)
WBC: 12.3 10*3/uL — ABNORMAL HIGH (ref 4.0–10.5)
nRBC: 0 % (ref 0.0–0.2)

## 2019-01-27 LAB — PROTIME-INR
INR: 0.91
Prothrombin Time: 12.1 seconds (ref 11.4–15.2)

## 2019-01-27 LAB — DIFFERENTIAL
Abs Immature Granulocytes: 0.16 10*3/uL — ABNORMAL HIGH (ref 0.00–0.07)
Basophils Absolute: 0.1 10*3/uL (ref 0.0–0.1)
Basophils Relative: 1 %
Eosinophils Absolute: 0.2 10*3/uL (ref 0.0–0.5)
Eosinophils Relative: 2 %
Immature Granulocytes: 1 %
Lymphocytes Relative: 28 %
Lymphs Abs: 3.4 10*3/uL (ref 0.7–4.0)
Monocytes Absolute: 0.9 10*3/uL (ref 0.1–1.0)
Monocytes Relative: 7 %
NEUTROS ABS: 7.6 10*3/uL (ref 1.7–7.7)
NEUTROS PCT: 61 %

## 2019-01-27 LAB — APTT: aPTT: 34 seconds (ref 24–36)

## 2019-01-27 LAB — I-STAT BETA HCG BLOOD, ED (MC, WL, AP ONLY): I-stat hCG, quantitative: <5 m[IU]/mL (ref <5)

## 2019-01-27 MED ORDER — SODIUM CHLORIDE 0.9% FLUSH: 3.0000 mL | Freq: Once | INTRAVENOUS | Status: DC

## 2019-01-27 MED ORDER — NAPROXEN 500 MG PO TABS: 500.0000 mg | ORAL_TABLET | Freq: Two times a day (BID) | ORAL | 0 refills | Status: DC

## 2019-01-27 NOTE — ED Provider Notes (Signed)
MSE was initiated and I personally evaluated the patient and placed orders (if any) at  5:36 PM on January 27, 2019.  The patient appears stable so that the remainder of the MSE may be completed by another provider.  On my exam patient was awake, alert, in no distress. When asked the patient what brings her to the emergency department, her her initial statement included concern about recently diagnosed boils in both axilla, and a history of recent cholecystectomy. She then stated that she has some discomfort with arm of motion on the left, though no weakness anywhere else, no speech difficulty. Subsequently she demonstrates symmetric capacity to move each arm, symmetric grip strength bilaterally. Is, however hypertensive, requiring additional evaluation, management.   Gerhard Munch, MD 01/27/19 [phone]

## 2019-01-27 NOTE — ED Triage Notes (Addendum)
Pt arrives with left arm numbness/stiffness x1 hour. Pt also endorses being seen at [facility] on 5th for boils under each armpit, pt on ABX. Pt reports cough with mucous. Jeraldine Loots, MD notified of pt left arm numbness and weakness x1 hr. Pt seen by Jeraldine Loots, MD in triage. RN to place standing orders. Pt alert, oriented, and ambulatory.

## 2019-01-27 NOTE — Discharge Instructions (Addendum)
Please return for any problem.  Follow-up with your regular care provider tomorrow as instructed.  Of note, your blood pressure today was mildly elevated.  This requires close follow-up with your primary care provider for further evaluation and treatment.  Follow-up with general surgery as instructed for possible continued treatment of your skin abscess.

## 2019-01-27 NOTE — ED Notes (Signed)
Patient verbalizes understanding of discharge instructions. Opportunity for questioning and answers were provided. Armband removed by staff, pt discharged from ED.  
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
## 2019-01-27 NOTE — ED Notes (Signed)
Pt arrived with reports of L sided arm numbness/weakness x 30 min.  Asked her to hold arms out straight and L arm is significantly lower than R.  This RN raised pt's arm and she has resistance in raising it.  Asked if she had arm pain and she states she does.  When arms moved straight out for pt she does not have an arm drift.  Pt placed in Triage 1 and Dr. Jeraldine Loots notified of pt.

## 2019-01-28 ENCOUNTER — Ambulatory Visit: Payer: Self-pay

## 2019-02-26 ENCOUNTER — Telehealth: Payer: Self-pay | Admitting: Family Medicine

## 2019-02-26 MED ORDER — CITALOPRAM HYDROBROMIDE 20 MG PO TABS: 20.0000 mg | ORAL_TABLET | Freq: Every day | ORAL | 3 refills | Status: DC

## 2019-02-26 MED ORDER — LORAZEPAM 1 MG PO TABS: 1.0000 mg | ORAL_TABLET | Freq: Two times a day (BID) | ORAL | 1 refills | Status: DC | PRN

## 2019-02-26 NOTE — Telephone Encounter (Signed)
Called and spoke to Valli.  She is very distraught and concerned about the coronavirus.  She is fearful, understandably.  Not currently working and therefore also worried about finances.    Recommended she stay informed but also turn off her TV and walk around outside for exercise and to get fresh away.  Continue social distancing as she has been.  Restarting her Celexa and Ativan combination.  She had been doing well off this for past year, but anxiety about recent events has caused resurgence in her anxiety levels.  No SI/HI.   Will call back Mon/Tues so I can recheck on her.

## 2019-02-26 NOTE — Telephone Encounter (Signed)
Pt would like Dr. Gwendolyn Grant to give her a call back. Pt says Dr. Gwendolyn Grant is the only one who can talk her down from a nervous breakdown. Please call pt back at her number in the chart. Please advise

## 2019-03-02 NOTE — Telephone Encounter (Signed)
Pt called nurse line wanting to directly to speak with PCP. All the information she gave me.

## 2019-03-02 NOTE — Telephone Encounter (Signed)
Attempted to call back.  No answer.  Voicemail has not yet been set up.

## 2019-03-03 NOTE — Telephone Encounter (Signed)
Patient returning Dr. Westly Pam phone call.

## 2019-03-03 NOTE — Telephone Encounter (Signed)
Contacted patient.  She reports she just wanted to let me know that she is "doing much better."  She is back on her celexa/ativan combination but hasn't needed the ativan.  Anxiety is much better since she returned to work.  Trying to keep distance from people and keeping hands clean as much as possible.  She will call back if she's having any issues.

## 2019-04-09 ENCOUNTER — Ambulatory Visit: Payer: BLUE CROSS/BLUE SHIELD

## 2019-04-28 ENCOUNTER — Other Ambulatory Visit: Payer: Self-pay

## 2019-04-28 ENCOUNTER — Encounter: Payer: Self-pay | Admitting: Family Medicine

## 2019-04-28 ENCOUNTER — Ambulatory Visit (INDEPENDENT_AMBULATORY_CARE_PROVIDER_SITE_OTHER): Payer: BLUE CROSS/BLUE SHIELD | Admitting: Family Medicine

## 2019-04-28 DIAGNOSIS — I10 Essential (primary) hypertension: Secondary | ICD-10-CM

## 2019-04-28 DIAGNOSIS — Z3009 Encounter for other general counseling and advice on contraception: Secondary | ICD-10-CM

## 2019-04-28 DIAGNOSIS — F411 Generalized anxiety disorder: Secondary | ICD-10-CM

## 2019-04-28 MED ORDER — AMLODIPINE BESYLATE 10 MG PO TABS: 10.0000 mg | ORAL_TABLET | Freq: Every day | ORAL | 1 refills | Status: DC

## 2019-04-28 MED ORDER — LORAZEPAM 1 MG PO TABS: 1.0000 mg | ORAL_TABLET | Freq: Two times a day (BID) | ORAL | 1 refills | Status: DC | PRN

## 2019-04-28 MED ORDER — CITALOPRAM HYDROBROMIDE 20 MG PO TABS: 20.0000 mg | ORAL_TABLET | Freq: Every day | ORAL | 3 refills | Status: DC
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
## 2019-04-28 NOTE — Assessment & Plan Note (Signed)
Nexplanon removal today.  PROCEDURE NOTE: Removal of Nexplanon  Patient given informed consent, signed copy in the chart.  Appropriate time out taken.  Area prepped and draped in usual sterile fashion  2 Cc of  1%  lidocaine  without epi   was used to obtain local anesthesia. After anesthesia was obtained,  0.5 cm incision made in skin at biceps groove.  Superficial.  Just under the tip of the nexplanon that was under the skin.  Easily visualized, grasped with forceps, removed easily.    Hemostasis obtained -- no bleeding.   Gauze applied.   Patient was given information about concerning signs and symptoms to watch for, such as fever, redness, unusual swelling, significant pain, drainage.   She now desires to become pregnant.  To start prenatal vitamin. Will follow with our office.

## 2019-04-28 NOTE — Assessment & Plan Note (Signed)
Initially reported she was doing very well. However, upon me telling her she was leaving, she became very tearful and requested to continue both the celexa and the ativan. She only requires infrequent use of ativan.  Refill provided today.  Tearful but we ended on a good note.  She expressed appreciation for her care over the years.

## 2019-04-28 NOTE — Patient Instructions (Signed)
It was great to see you again today!  The Nexplanon removal went well.    Best of luck with your boyfriend and marriage!  I have sent in your blood pressure medicines.    Take care, it's been great to take care of you!

## 2019-04-28 NOTE — Assessment & Plan Note (Signed)
Elevated today.   Has been gaining weight since being at home. Needs refills -- out recently. I have sent these in.  Recheck in 2 months.

## 2019-05-03 ENCOUNTER — Telehealth: Payer: Self-pay | Admitting: Family Medicine

## 2019-05-03 NOTE — Telephone Encounter (Signed)
Received phone call from patient requesting antibiotics and pain medicine for which she says is a bad tooth.  She says that it started hurting about 2 days ago and now "the whole side of my face is swollen".  She denies any respiratory changes, her voice pattern was unstressed, she said that she has not been eating because it hurts but denies problem physically passing food through her throat.  She says specifically that she has not concern for her airway and does not think that she needs to go to an emergency department because she wants to stay away from people who are sick due to concern for coronavirus.  We had a discussion about her concern for being around people who are potentially sick and the fact that if she is hurting this bad and has swelling in her face that she should be seen by someone today, we do not have a clinic appointment available today she has been advised to go to urgent care.  She disagreed with my assessment that I should not be prescribing antibiotics and pain medicine over the phone and that she needed to be evaluated in person.  -Dr. Parke Simmers

## 2019-05-04 ENCOUNTER — Telehealth: Payer: Self-pay | Admitting: Family Medicine

## 2019-05-04 NOTE — Telephone Encounter (Signed)
Pt would like [facility] to give her a call about her mental state.

## 2019-05-04 NOTE — Telephone Encounter (Signed)
Pt is calling back again and asked that Dr. Gwendolyn Grant call her today if possible.

## 2019-05-05 MED ORDER — TRAMADOL HCL 50 MG PO TABS: 50.0000 mg | ORAL_TABLET | Freq: Four times a day (QID) | ORAL | 0 refills | Status: AC | PRN

## 2019-05-05 MED ORDER — AMOXICILLIN-POT CLAVULANATE 875-125 MG PO TABS: 1.0000 | ORAL_TABLET | Freq: Two times a day (BID) | ORAL | 0 refills | Status: DC

## 2019-05-05 MED ORDER — LORAZEPAM 1 MG PO TABS: 1.0000 mg | ORAL_TABLET | Freq: Two times a day (BID) | ORAL | 1 refills | Status: DC | PRN

## 2019-05-05 NOTE — Telephone Encounter (Signed)
Called patient back.  She is still having issues with anxiety based on my leaving.  I refilled her Celexa last time but did not send the lorazepam to the correct pharmacy.  I have sent this now to the [facility] on [address].  She is also having dental issues.  She has a broken tooth and called to be seen by her dentist.  Back left molar.  Dentist is not seeing patients currently due to coronavirus fears.  Will start seeing patients next week.  She is concern for dental infection.  Plan will be to treat with tramadol and Augmentin for to call her dentist back next week to be scheduled.  Patient appreciative of call and agrees with plan.

## 2019-05-18 ENCOUNTER — Telehealth: Payer: BC Managed Care – PPO

## 2019-05-18 ENCOUNTER — Other Ambulatory Visit: Payer: Self-pay

## 2019-07-05 ENCOUNTER — Telehealth: Payer: Self-pay | Admitting: Family Medicine

## 2019-07-05 NOTE — Telephone Encounter (Signed)
Pt called requesting to get a call from MD. Wanted to ask questions about getting FMLA. Best phone # to contact is [phone].

## 2019-07-09 NOTE — Telephone Encounter (Signed)
Returned call to patient. She is newly assigned to me after her prior PCP left our office.  She has since decided not to use FMLA - was contemplating using it due to her dad having COVID and her taking him out of his nursing facility. She has continued working instead.  Patient them stated she has anxiety and needs medication for it. Discussed this would probably be best addressed in a visit, offered in person or virtual visit. As I was going to schedule appointment for patient, she stated "you know what, I don't need any medicine. I'll be ok with out it"  I asked if she was sure, and offered for her to call back if she decides she would like to be seen or discuss this more. Patient appreciative.  Leeanne Rio, MD

## 2019-08-03 NOTE — Telephone Encounter (Signed)
Pt calls back. She has since decided that she will need FMLA completed.  PCP next available appt is not until 9/14.  Pt is in danger of losing her job if she does not get paperwork completed.  Will forward to PCP to advise.   Christen Bame, CMA

## 2019-08-04 ENCOUNTER — Telehealth: Payer: Self-pay | Admitting: Family Medicine

## 2019-08-04 NOTE — Telephone Encounter (Signed)
Pt calling to check status. Guelda Batson, CMA  

## 2019-08-04 NOTE — Telephone Encounter (Signed)
Called patient. Has been out of work since Aug 1 due to caring for her father, Shalunda Lindh (DOB 03/10/52). She pulled him from nursing home [facility] because he got COVID there in May and she wanted him cared for at home. He has no fever, cough, shortness of breath currently had she reports he has had two negative COVID tests since being diagnosed in May.  She has had to stay home caring for him while arranging his care at home as it has been challenging to get workers into the home due to his history of having COVID months ago. She was under the impression that he does not currently have a primary doctor, but I was able to look him up in our system and he is still considered our patient here at the [facility] as he's been seen within the last 3 years.  Scheduled appointment for her father on Friday to meet his new PCP Dr. Ky Barban. Since he is an established patient with [place], I will complete Taylore's FMLA paperwork so she will not lose her job. She will drop it off shortly. She is also going to include info about his discharge from the nursing home.  Leeanne Rio, MD

## 2019-08-04 NOTE — Telephone Encounter (Signed)
FMLA form dropped off for work at front desk for completion.  Verified that patient section of form has been completed.  .  Placed form in Doctors box.  Lisa Crosby

## 2019-08-05 NOTE — Telephone Encounter (Signed)
FMLA forms completed. Please note the patient on these forms is actually Lisa Crosby's father, Lisa Crosby (DOB 03/10[birth year]52).  Will document this under an encounter in his chart.  Leeanne Rio, MD

## 2019-08-05 NOTE — Telephone Encounter (Signed)
Pt informed that   Placed in fax pile  Requested copy to be placed in batch scanning  Put up front for pick up.  Christen Bame, CMA

## 2019-08-09 ENCOUNTER — Telehealth: Payer: Self-pay | Admitting: Family Medicine

## 2019-08-09 NOTE — Telephone Encounter (Signed)
Patient came into office regarding her FMLA Lisa Crosby) paperwork, that needs corrected question 6 & 7 and pt stated that she will not be returning back to work until 09-15 due to her father getting healthcare in order pt's [place] paperwork is already here In office. Please give pt a call.

## 2019-08-10 NOTE — Telephone Encounter (Signed)
Patient already returned some fmla forms that were not completely filled out/incorrectly and she must have these finished today or she will lose her job.  It has been a few days since she returned/turned them in again.  Please call her at [phone] to let her know the status of this form.

## 2019-08-10 NOTE — Telephone Encounter (Signed)
Contacted patient. Clarified. She needs forms to say she will return on 08/27/19. That is when she has care set up for her dad [employer]). Currently has care arranged from 9a-1p and is working to get other hours covered. FMLA forms updated and faxed in. Placed copy in scan pile. Originals put at front desk for her to pick up  Leeanne Rio, MD

## 2019-10-12 ENCOUNTER — Other Ambulatory Visit: Payer: Self-pay

## 2019-10-12 ENCOUNTER — Telehealth: Payer: Self-pay | Admitting: Psychology

## 2019-10-12 ENCOUNTER — Encounter: Payer: Self-pay | Admitting: Family Medicine

## 2019-10-12 ENCOUNTER — Ambulatory Visit (INDEPENDENT_AMBULATORY_CARE_PROVIDER_SITE_OTHER): Payer: BC Managed Care – PPO | Admitting: Family Medicine

## 2019-10-12 VITALS — BP 135/80 | HR 81 | Wt 223.8 lb

## 2019-10-12 DIAGNOSIS — I10 Essential (primary) hypertension: Secondary | ICD-10-CM | POA: Diagnosis not present

## 2019-10-12 DIAGNOSIS — F17200 Nicotine dependence, unspecified, uncomplicated: Secondary | ICD-10-CM

## 2019-10-12 DIAGNOSIS — F411 Generalized anxiety disorder: Secondary | ICD-10-CM

## 2019-10-12 MED ORDER — AMOXICILLIN 500 MG PO CAPS: 500.0000 mg | ORAL_CAPSULE | Freq: Two times a day (BID) | ORAL | 0 refills | Status: DC

## 2019-10-12 MED ORDER — BUSPIRONE HCL 5 MG PO TABS: 5.0000 mg | ORAL_TABLET | Freq: Three times a day (TID) | ORAL | 1 refills | Status: DC

## 2019-10-12 MED ORDER — AMLODIPINE BESYLATE 5 MG PO TABS: 5.0000 mg | ORAL_TABLET | Freq: Every day | ORAL | 1 refills | Status: DC

## 2019-10-12 NOTE — Assessment & Plan Note (Signed)
Uncontrolled, primarily with symptoms of difficulty focusing at work, impairing functioning. Discussed option of trial of another SSRI but patient prefers to begin medication that will help more promptly. Patient concerned about possibly having adult onset ADHD. - Start buspar 5mg  three times daily.  - recommend scheduling with psychiatry for diagnostic clarity - will send message to Dr. Hartford Poli about starting short term therapy to help with anxiety - follow up with me in a few weeks via virtual visit to assess how she's doing

## 2019-10-12 NOTE — Assessment & Plan Note (Signed)
Interested in quitting. Will send message to pharmacy team about getting her set up with their tobacco cessation program.

## 2019-10-12 NOTE — Patient Instructions (Signed)
It was nice to meet you today!  For anxiety - start buspar 5mg  three times daily - Dr. Hartford Poli will be in touch about scheduling with her for therapy - call a psychiatry office to schedule an appointment - follow up with me in 2-3 weeks via virtual visit  For blood pressure - restart amlodipine 5mg  daily  For tooth pain - sent in antibiotic for you  For smoking - someone from pharmacy team will reach out  Give finger more time to heal Checking cholesterol levels today  Be well, Dr. Ardelia Mems

## 2019-10-12 NOTE — Telephone Encounter (Signed)
-----   Message from Leeanne Rio, MD sent at 10/12/2019  2:14 PM EST ----- This is the patient I told you about who is interested in short term therapy. Can you reach out to her? Thanks!! Lisa Crosby

## 2019-10-12 NOTE — Assessment & Plan Note (Signed)
Blood pressure normal today despite being out of medications for a week. Offered to stop blood pressure medications but patient prefers to remain on them. Start back amlodipine at 5mg  daily (lower than prior dose).

## 2019-10-12 NOTE — Progress Notes (Signed)
Date of Visit: 10/12/2019   HPI:  Patient presents to discuss several issues today. This is my first time meeting her in person, after her prior PCP Dr. Mingo Amber left our practice.  Trouble focusing - previously treated for GAD with celexa, also in past took lexapro. Lately has noticed lots of trouble focusing at work, as she is worried about other things going on. She is employed in [employer]. Denies SI/HI. Has had to cut back on her hours to part time because of the trouble she's had at work. Agreeable to starting therapy to help. Wants medication that will help quickly. Did not like how celexa or lexapro made her feel. Thinks anxiety is also causing her bodily symptoms - has noticed intermittent occasional numbness in her L arm which she thinks is related to anxiety. No chest pain.   Tooth pain - having pain in upper left molar. Has dental appointment but can't be seen for 2 weeks. Having trouble sleeping due to pain.  Finger swelling - L middle finger cut it while cooking a week ago on distal part of finger. Still has some swelling there. Can't bend it all the way still due to swelling.  Smoking - current smoker. Interested in quitting, thinks she'll need assistance with medication or nicotine replacement.  Hypertension - out of blood pressure medications for a week. Previously on amlodipine 10mg  daily. Denies chest pain or shortness of breath. Prefers to be on medication for blood pressure.  ROS: See HPI.  [place]: history of GAD, hypertension, tobacco abuse, obesity  PHYSICAL EXAM: BP 135/80   Pulse 81   Wt 223 lb 12.8 oz (101.5 kg)   LMP 10/04/2019 (Approximate)   SpO2 98%   BMI 38.42 kg/m  Gen: no acute distress, pleasant, cooperative, well appearing HEENT: normocephalic, atraumatic. Moist mucous membranes. L upper molar without obvious cracks though gums surrounding are tender to palpation. No drainage or swelling. Lungs: normal respiratory effort Neuro: grossly nonfocal, speech  normal Psych: normal range of affect, well groomed, speech normal in rate and volume, normal eye contact  Extremity: third middle finger with well healing 0.5cm laceration on radial aspect of distal phalanx. Mild swelling in the area. No drainage or erythema.  ASSESSMENT/PLAN:  Health maintenance:  -patient declined flu shot today -check lipid panel today  Essential hypertension, benign Blood pressure normal today despite being out of medications for a week. Offered to stop blood pressure medications but patient prefers to remain on them. Start back amlodipine at 5mg  daily (lower than prior dose).  Generalized anxiety disorder Uncontrolled, primarily with symptoms of difficulty focusing at work, impairing functioning. Discussed option of trial of another SSRI but patient prefers to begin medication that will help more promptly. Patient concerned about possibly having adult onset ADHD. - Start buspar 5mg  three times daily.  - recommend scheduling with psychiatry for diagnostic clarity - will send message to Dr. Hartford Poli about starting short term therapy to help with anxiety - follow up with me in a few weeks via virtual visit to assess how she's doing   TOBACCO ABUSE Interested in quitting. Will send message to pharmacy team about getting her set up with their tobacco cessation program.  Soft tissue dental infection - likely early infection. Has upcoming appointment with dentist. Treat with amoxicillin until that appointment.   Finger laceration - healing well, monitor.    FOLLOW UP: Follow up in a few weeks via virtual visit for GAD  [place] J. Ardelia Mems, [place]

## 2019-10-12 NOTE — Telephone Encounter (Signed)
Called pt to set up appt with Dr. Hartford Poli. Pt confirmed appt for 11/5 at 230pm via video

## 2019-10-13 ENCOUNTER — Encounter: Payer: Self-pay | Admitting: Family Medicine

## 2019-10-13 ENCOUNTER — Telehealth: Payer: Self-pay | Admitting: Pharmacist

## 2019-10-13 DIAGNOSIS — F17200 Nicotine dependence, unspecified, uncomplicated: Secondary | ICD-10-CM

## 2019-10-13 LAB — LIPID PANEL
Chol/HDL Ratio: 4.2 ratio (ref 0.0–4.4)
Cholesterol, Total: 166 mg/dL (ref 100–199)
HDL: 40 mg/dL (ref >39)
LDL Chol Calc (NIH): 107 mg/dL — ABNORMAL HIGH (ref 0–99)
Triglycerides: 106 mg/dL (ref 0–149)
VLDL Cholesterol Cal: 19 mg/dL (ref 5–40)

## 2019-10-13 MED ORDER — CHANTIX STARTING MONTH PAK 0.5 MG X 11 & 1 MG X 42 PO TABS: ORAL_TABLET | ORAL | 0 refills | Status: DC

## 2019-10-13 NOTE — Telephone Encounter (Signed)
Contacted patient at the request of PCP, Dr. Ardelia Mems, to discuss/initiate therapy for tobacco cessation.    Patient contact and she expressed a high level of commitment to quitting smoking at this time.  She reports smoking since ~ 38years of age.  Smokes ~ 1 pack per day.  She denies any trial /use of any tobacco cessation product in the past.  She noted that her sister had tried Chantix and experience vivid dreams.   She was willing and interested in using Chanitx with her quit attempt. Patient educated on purpose, proper use and potential adverse effects of nausea.  Following instruction patient verbalized understanding of treatment plan.   Plan follow-up in 1 week by phone.

## 2019-10-13 NOTE — Telephone Encounter (Signed)
Noted and agree. 

## 2019-10-14 ENCOUNTER — Other Ambulatory Visit: Payer: Self-pay

## 2019-10-14 ENCOUNTER — Telehealth: Payer: BC Managed Care – PPO | Admitting: Psychology

## 2019-10-14 ENCOUNTER — Telehealth: Payer: Self-pay | Admitting: Psychology
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
## 2019-10-15 ENCOUNTER — Telehealth: Payer: BC Managed Care – PPO | Admitting: Psychology

## 2019-10-15 ENCOUNTER — Other Ambulatory Visit: Payer: Self-pay

## 2019-10-15 ENCOUNTER — Telehealth (INDEPENDENT_AMBULATORY_CARE_PROVIDER_SITE_OTHER): Payer: BC Managed Care – PPO | Admitting: Psychology

## 2019-10-15 DIAGNOSIS — F411 Generalized anxiety disorder: Secondary | ICD-10-CM | POA: Diagnosis not present

## 2019-10-15 NOTE — Progress Notes (Signed)
Integrated Behavioral Health Visit via Telemedicine (Telephone)  10/15/2019 AZALEAH USMAN [id number]   Session Start time: 36  Session End time: 63 Total time: 63  Referring Provider: Dr. Ardelia Mems Type of Visit: Telephonic Patient location: mobile  [facility] Provider location: [facility] All persons participating in visit: pt and provider     Discussed confidentiality: Yes    The following statements were read to the patient and/or legal guardian that are established with the [facility] Provider.  "The purpose of this phone visit is to provide behavioral health care while limiting exposure to the coronavirus (COVID19).  There is a possibility of technology failure and discussed alternative modes of communication if that failure occurs."  "By engaging in this telephone visit, you consent to the provision of healthcare.  Additionally, you authorize for your insurance to be billed for the services provided during this telephone visit."   Patient and/or legal guardian consented to telephone visit: Yes   PRESENTING CONCERNS: Patient and/or family reports the following symptoms/concerns: Pt reports syx of anxiety such as issues with paying attention at job.  Pt reported her current stressors financials, home schooling during pandemic along with caring for her ill father.  Pt reported at times she copes with alcohol because it helps her to "escape".  Pt reported that she has insight into when she is triggered and would like to find other ways to cope.  Pt reported she has been walking to engage in self-care  Duration of problem: 6 months ; Severity of problem: moderate  STRENGTHS (Protective Factors/Coping Skills): Strong support with friend and insight into syx, willing to try alternatives and engage in medication treatment    GOALS ADDRESSED: Patient will: 1.  Reduce symptoms of: anxiety  2.  Increase knowledge and/or ability of: coping skills  3.  Demonstrate ability to: Increase healthy  adjustment to current life circumstances  INTERVENTIONS: Interventions utilized:  Brief CBT Standardized Assessments completed: Not Needed  ASSESSMENT: Patient currently experiencing anxiety related to life circumstances   Patient may benefit from CBT  PLAN: 1. Follow up with behavioral health clinician on : Challenging anxious  thoughts  2. Behavioral recommendations: Pt should continue to engage in self-care and engage in grounding to happen when experiencing anxiety.  3. Referral(s): [facility] (In Clinic)  Erlinda Hong, PhD., LMFT-A

## 2019-10-21 ENCOUNTER — Telehealth: Payer: Self-pay | Admitting: Pharmacist

## 2019-10-21 NOTE — Telephone Encounter (Signed)
Called patient to discuss progress on tobacco cessation, done by Donald Prose, PharmD candidate. Patient picked up phone call. Reports she is still smoking and has not picked up Chantix because she needs to find her insurance card. Requested that we call back later, follow-up by phone in 1 week.

## 2019-10-28 ENCOUNTER — Telehealth: Payer: Self-pay | Admitting: Pharmacist
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
## 2019-10-30 ENCOUNTER — Emergency Department (EMERGENCY_DEPARTMENT_HOSPITAL): Payer: BC Managed Care – PPO

## 2019-10-30 ENCOUNTER — Encounter (HOSPITAL_COMMUNITY): Payer: Self-pay

## 2019-10-30 ENCOUNTER — Emergency Department
Admission: EM | Admit: 2019-10-30 | Discharge: 2019-10-31 | Disposition: A | Discharge status: Home / Self Care | Payer: BC Managed Care – PPO | Attending: Emergency Medicine | Admitting: Emergency Medicine

## 2019-10-30 ENCOUNTER — Emergency Department (HOSPITAL_COMMUNITY): Payer: Self-pay

## 2019-10-30 ENCOUNTER — Other Ambulatory Visit: Payer: Self-pay

## 2019-10-30 DIAGNOSIS — R079 Chest pain, unspecified: Secondary | ICD-10-CM | POA: Insufficient documentation

## 2019-10-30 DIAGNOSIS — F17210 Nicotine dependence, cigarettes, uncomplicated: Secondary | ICD-10-CM | POA: Insufficient documentation

## 2019-10-30 DIAGNOSIS — I10 Essential (primary) hypertension: Secondary | ICD-10-CM | POA: Insufficient documentation

## 2019-10-30 LAB — CBC WITH DIFF
BASOPHIL #: <0.1 10*3/uL (ref <=0.20)
BASOPHIL %: 0 %
EOSINOPHIL #: 0.28 10*3/uL (ref <=0.50)
EOSINOPHIL %: 3 %
HCT: 38.8 % (ref 34.8–46.0)
HGB: 13 g/dL (ref 11.5–16.0)
IMMATURE GRANULOCYTE #: <0.1 10*3/uL (ref <0.10)
IMMATURE GRANULOCYTE %: 1 % (ref 0–1)
LYMPHOCYTE #: 3.28 10*3/uL (ref 1.00–4.80)
LYMPHOCYTE %: 31 %
MCH: 30 pg (ref 26.0–32.0)
MCHC: 33.5 g/dL (ref 31.0–35.5)
MCV: 89.4 fL (ref 78.0–100.0)
MONOCYTE #: 0.82 10*3/uL (ref 0.20–1.10)
MONOCYTE %: 8 %
MPV: 9.3 fL (ref 8.7–12.5)
NEUTROPHIL #: 6.28 10*3/uL (ref 1.50–7.70)
NEUTROPHIL %: 57 %
PLATELETS: 385 10*3/uL (ref 150–400)
RBC: 4.34 10*6/uL (ref 3.85–5.22)
RDW-CV: 13.1 % (ref 11.5–15.5)
WBC: 10.8 10*3/uL (ref 3.7–11.0)

## 2019-10-30 LAB — BASIC METABOLIC PANEL
ANION GAP: 10 mmol/L (ref 4–13)
BUN/CREA RATIO: 12 (ref 6–22)
BUN: 10 mg/dL (ref 8–25)
CALCIUM: 8.7 mg/dL (ref 8.5–10.2)
CHLORIDE: 107 mmol/L (ref 96–111)
CO2 TOTAL: 21 mmol/L — ABNORMAL LOW (ref 22–32)
CREATININE: 0.85 mg/dL (ref 0.49–1.10)
ESTIMATED GFR: >60 mL/min/{1.73_m2} (ref >60)
GLUCOSE: 95 mg/dL (ref 65–139)
POTASSIUM: 3.9 mmol/L (ref 3.5–5.1)
SODIUM: 138 mmol/L (ref 136–145)

## 2019-10-30 LAB — TROPONIN-I (FOR ED ONLY): TROPONIN I: <7 ng/L (ref 0–30)

## 2019-10-30 LAB — PT/INR
INR: 1.01 (ref 0.80–1.20)
PROTHROMBIN TIME: 11.6 s (ref 9.1–13.9)

## 2019-10-30 LAB — HCG (FOR ED ONLY): HCG QUANTITATIVE PREGNANCY: <1 IU/L (ref <5)

## 2019-10-30 LAB — PTT (PARTIAL THROMBOPLASTIN TIME): APTT: 25.9 s (ref 24.2–37.5)

## 2019-10-30 MED ORDER — AMLODIPINE 10 MG TABLET
10.00 mg | ORAL_TABLET | ORAL | Status: AC
Start: 2019-10-31 — End: 2019-10-31
  Administered 2019-10-31: 10 mg via ORAL
  Filled 2019-10-30 (×2): qty 1

## 2019-10-30 MED ORDER — SODIUM CHLORIDE 0.9 % (FLUSH) INJECTION SYRINGE
2.00 mL | INJECTION | INTRAMUSCULAR | Status: DC | PRN
Start: 2019-10-30 — End: 2019-10-31

## 2019-10-30 MED ORDER — SODIUM CHLORIDE 0.9 % (FLUSH) INJECTION SYRINGE
2.00 mL | INJECTION | Freq: Three times a day (TID) | INTRAMUSCULAR | Status: DC
Start: 2019-10-30 — End: 2019-10-31
  Administered 2019-10-30: 23:00:00
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
## 2019-10-31 LAB — ECG 12-LEAD
Atrial Rate: 82 {beats}/min
Calculated P Axis: 43 degrees
Calculated R Axis: 62 degrees
Calculated T Axis: 33 degrees
PR Interval: 172 ms
QRS Duration: 94 ms
QT Interval: 366 ms
QTC Calculation: 427 ms
Ventricular rate: 82 {beats}/min

## 2019-10-31 MED ORDER — AMLODIPINE 10 MG TABLET
10.0000 mg | ORAL_TABLET | Freq: Every day | ORAL | 4 refills | Status: DC
Start: 2019-10-31 — End: 2019-10-31

## 2019-10-31 MED ORDER — AMLODIPINE 10 MG TABLET
10.0000 mg | ORAL_TABLET | Freq: Every day | ORAL | 0 refills | Status: AC
Start: 2019-10-31 — End: 2019-11-07

## 2019-10-31 MED ORDER — AMLODIPINE 10 MG TABLET
10.0000 mg | ORAL_TABLET | Freq: Every day | ORAL | Status: DC
Start: 2019-10-31 — End: 2019-10-31

## 2019-10-31 MED ORDER — AMLODIPINE 10 MG TABLET
10.0000 mg | ORAL_TABLET | Freq: Every day | ORAL | Status: DC
Start: 2019-10-31 — End: 2019-10-31
  Administered 2019-10-31: 0 mg via ORAL

## 2019-10-31 NOTE — Discharge Instructions (Signed)
You will be discharged with an additional dose of amlodipine to be taken on 10/31/2019 in the evening per your home regimen.

## 2019-10-31 NOTE — ED Nurses Note (Addendum)
PIV removed, catheter intact, pressure held. Discharge instructions given, pt verbalized understanding and had no questions at this time. Pt provided prescription. Pt ambulatory out of the department at this time.

## 2019-11-01 ENCOUNTER — Other Ambulatory Visit: Payer: Self-pay

## 2019-11-01 ENCOUNTER — Telehealth: Payer: BC Managed Care – PPO | Admitting: Psychology

## 2019-11-01 MED ORDER — GENERIC EXTERNAL MEDICATION
Status: DC
Start: ? — End: 2019-11-01
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
## 2019-11-16 ENCOUNTER — Telehealth: Payer: Self-pay | Admitting: Psychology

## 2019-11-16 NOTE — Telephone Encounter (Signed)
Pt was tearful on phone and stated she would like to set up appt.  Pt set up appt for 12/11 at 10AM.  Pt was contacted shortly after to check-in on mental health of patient.  Pt denied SI.  Pt reported she is "going through some rough times but she knows it will make her stronger"

## 2019-11-18 ENCOUNTER — Telehealth: Payer: Self-pay | Admitting: Pharmacist
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
## 2019-11-19 ENCOUNTER — Other Ambulatory Visit: Payer: Self-pay

## 2019-11-19 ENCOUNTER — Telehealth: Payer: BC Managed Care – PPO | Admitting: Psychology

## 2019-11-19 NOTE — Progress Notes (Signed)
Pt requested to reschedule appt

## 2019-11-23 ENCOUNTER — Other Ambulatory Visit: Payer: Self-pay

## 2019-11-23 ENCOUNTER — Telehealth (INDEPENDENT_AMBULATORY_CARE_PROVIDER_SITE_OTHER): Payer: BC Managed Care – PPO | Admitting: Psychology

## 2019-11-23 NOTE — Progress Notes (Signed)
Called pt twice.  Pt answered on second try and reported she is not able to do appt today.  Pt reported she would call office to reschedule

## 2020-01-03 ENCOUNTER — Other Ambulatory Visit: Payer: Self-pay | Admitting: Family Medicine

## 2020-01-04 ENCOUNTER — Other Ambulatory Visit: Payer: Self-pay | Admitting: Family Medicine

## 2020-01-04 NOTE — Telephone Encounter (Signed)
Please ask patient to schedule follow up with me. I will send in a 1 month supply but will not refill beyond that. Virtual appointment is ok if patient prefers that  Thanks Latrelle Dodrill, MD

## 2020-01-05 NOTE — Telephone Encounter (Signed)
Attempted to reach patient for follow up visit.  No answer and no ability to leave message.  Glennie Hawk, CMA

## 2020-01-05 NOTE — Telephone Encounter (Signed)
Attempted to call patient to schedule appointment.  No answer and no ability to leave voice message.  Glennie Hawk, CMA

## 2020-10-27 ENCOUNTER — Ambulatory Visit
Admission: EM | Admit: 2020-10-27 | Discharge: 2020-10-27 | Disposition: A | Discharge status: Home / Self Care | Payer: BC Managed Care – PPO | Attending: Physician Assistant | Admitting: Physician Assistant

## 2020-10-27 DIAGNOSIS — M79642 Pain in left hand: Secondary | ICD-10-CM

## 2020-10-27 DIAGNOSIS — Z20822 Contact with and (suspected) exposure to covid-19: Secondary | ICD-10-CM

## 2020-10-27 DIAGNOSIS — Z1152 Encounter for screening for COVID-19: Secondary | ICD-10-CM

## 2020-10-27 DIAGNOSIS — I10 Essential (primary) hypertension: Secondary | ICD-10-CM

## 2020-10-27 LAB — POCT URINE PREGNANCY: Preg Test, Ur: NEGATIVE

## 2020-10-27 MED ORDER — MELOXICAM 15 MG PO TABS: 15.0000 mg | ORAL_TABLET | Freq: Every day | ORAL | 1 refills | Status: DC

## 2020-10-27 MED ORDER — TIZANIDINE HCL 4 MG PO TABS: 4.0000 mg | ORAL_TABLET | Freq: Every day | ORAL | 0 refills | Status: DC

## 2020-10-27 MED ORDER — AMLODIPINE BESYLATE 5 MG PO TABS: 5.0000 mg | ORAL_TABLET | Freq: Every day | ORAL | 1 refills | Status: DC
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
## 2020-10-27 NOTE — ED Triage Notes (Signed)
Pt c/o lt hand pain with no injury x4 days. States pain when typing at work. Pt states has had a cough and congestion x3 days and wants a covid test. Pt requesting a pregnancy test, 2wks late on menstrual cycle.

## 2020-10-27 NOTE — Discharge Instructions (Addendum)
Left hand pain suspect is carpal tunnel syndrome recommend purchasing over-the-counter wrist splint to wear at nighttime.  Also prescribed meloxicam 15 mg to take once daily as needed for hand pain.  Also prescribed tizanidine which is a muscle relaxer this is also indicated for pain related to carpal tunnel syndrome.  Take pain medication at nighttime only as this can cause severe drowsiness.  I have also refilled your blood pressure medication amlodipine 5 mg resume taken nightly at bedtime.  Schedule for blood pressure follow-up with your primary care provider.

## 2020-10-29 LAB — NOVEL CORONAVIRUS, NAA: SARS-CoV-2, NAA: NOT DETECTED

## 2020-10-29 LAB — SARS-COV-2, NAA 2 DAY TAT

## 2020-11-16 ENCOUNTER — Other Ambulatory Visit: Payer: Self-pay | Admitting: Family Medicine

## 2020-11-17 ENCOUNTER — Encounter: Payer: Self-pay | Admitting: Family Medicine

## 2020-11-27 ENCOUNTER — Other Ambulatory Visit: Payer: Self-pay

## 2020-11-27 ENCOUNTER — Other Ambulatory Visit (HOSPITAL_COMMUNITY)
Admission: RE | Admit: 2020-11-27 | Discharge: 2020-11-27 | Disposition: A | Discharge status: Home / Self Care | Payer: BC Managed Care – PPO | Source: Ambulatory Visit | Attending: Family Medicine | Admitting: Family Medicine

## 2020-11-27 ENCOUNTER — Ambulatory Visit (INDEPENDENT_AMBULATORY_CARE_PROVIDER_SITE_OTHER): Payer: BC Managed Care – PPO | Admitting: Student in an Organized Health Care Education/Training Program

## 2020-11-27 VITALS — BP 150/100 | HR 109 | Wt 223.8 lb

## 2020-11-27 DIAGNOSIS — I10 Essential (primary) hypertension: Secondary | ICD-10-CM

## 2020-11-27 DIAGNOSIS — Z124 Encounter for screening for malignant neoplasm of cervix: Secondary | ICD-10-CM | POA: Diagnosis present

## 2020-11-27 DIAGNOSIS — Z113 Encounter for screening for infections with a predominantly sexual mode of transmission: Secondary | ICD-10-CM

## 2020-11-27 DIAGNOSIS — Z3009 Encounter for other general counseling and advice on contraception: Secondary | ICD-10-CM

## 2020-11-27 DIAGNOSIS — Z309 Encounter for contraceptive management, unspecified: Secondary | ICD-10-CM

## 2020-11-27 DIAGNOSIS — K644 Residual hemorrhoidal skin tags: Secondary | ICD-10-CM

## 2020-11-27 HISTORY — DX: Encounter for screening for malignant neoplasm of cervix: Z12.4

## 2020-11-27 LAB — POCT WET PREP (WET MOUNT): Clue Cells Wet Prep Whiff POC: POSITIVE

## 2020-11-27 LAB — POCT URINE PREGNANCY: Preg Test, Ur: NEGATIVE

## 2020-11-27 MED ORDER — HYDROCORTISONE 2.5 % EX OINT: TOPICAL_OINTMENT | Freq: Two times a day (BID) | CUTANEOUS | 0 refills | Status: DC

## 2020-11-27 MED ORDER — MEDROXYPROGESTERONE ACETATE 150 MG/ML IM SUSY
150.0000 mg | PREFILLED_SYRINGE | Freq: Once | INTRAMUSCULAR | Status: AC
  Administered 2020-11-27: 150 mg via INTRAMUSCULAR

## 2020-11-27 MED ORDER — METRONIDAZOLE 500 MG PO TABS: 500.0000 mg | ORAL_TABLET | Freq: Three times a day (TID) | ORAL | 0 refills | Status: DC

## 2020-11-27 NOTE — Assessment & Plan Note (Signed)
Pap smear collected today 

## 2020-11-27 NOTE — Patient Instructions (Addendum)
It was a pleasure to see you today!  To summarize our discussion for this visit:  We completed a pap smear and STD screening today.   I am refilling your blood pressure medication today which was elevated today. Please follow up with your PCP to check again on medication.   You can start birth control today and use a back up contraception such as condoms for at least a week. Continue to use condoms after this for infection prevention.   I will send in a treatment for trichomonas and your partner will also need to be treated.   Some additional health maintenance measures we should update are: Health Maintenance Due  Topic Date Due  . COVID-19 Vaccine (1) Never done  . PAP SMEAR-Modifier  06/21/2020  . INFLUENZA VACCINE  07/09/2020  .    Please return to our clinic to see  In 2 weeks to monitor blood pressure.  Call the clinic at [phone] if your symptoms worsen or you have any concerns.   Thank you for allowing me to take part in your care,  Dr. Jamelle Rushing   Trichomoniasis Trichomoniasis is an STI (sexually transmitted infection) that can affect both women and men. In women, the outer area of the female genitalia (vulva) and the vagina are affected. In men, mainly the penis is affected, but the prostate and other reproductive organs can also be involved.  This condition can be treated with medicine. It often has no symptoms (is asymptomatic), especially in men. If not treated, trichomoniasis can last for months or years. What are the causes? This condition is caused by a parasite called Trichomonas vaginalis. Trichomoniasis most often spreads from person to person (is contagious) through sexual contact. What increases the risk? The following factors may make you more likely to develop this condition:  Having unprotected sex.  Having sex with a partner who has trichomoniasis.  Having multiple sexual partners.  Having had previous trichomoniasis infections or other  STIs. What are the signs or symptoms? In women, symptoms of trichomoniasis include:  Abnormal vaginal discharge that is clear, white, gray, or yellow-green and foamy and has an unusual "fishy" odor.  Itching and irritation of the vagina and vulva.  Burning or pain during urination or sex.  Redness and swelling of the genitals. In men, symptoms of trichomoniasis include:  Penile discharge that may be foamy or contain pus.  Pain in the penis. This may happen only when urinating.  Itching or irritation inside the penis.  Burning after urination or ejaculation. How is this diagnosed? In women, this condition may be found during a routine Pap test or physical exam. It may be found in men during a routine physical exam. Your health care provider may do tests to help diagnose this infection, such as:  Urine tests (men and women).  The following in women: ? Testing the pH of the vagina. ? A vaginal swab test that checks for the Trichomonas vaginalis parasite. ? Testing vaginal secretions. Your health care provider may test you for other STIs, including HIV (human immunodeficiency virus). How is this treated? This condition is treated with medicine taken by mouth (orally), such as metronidazole or tinidazole, to fight the infection. Your sexual partner(s) also need to be tested and treated.  If you are a woman and you plan to become pregnant or think you may be pregnant, tell your health care provider right away. Some medicines that are used to treat the infection should not be taken during pregnancy.  Your health care provider may recommend over-the-counter medicines or creams to help relieve itching or irritation. You may be tested for infection again 3 months after treatment. Follow these instructions at home:  Take and use over-the-counter and prescription medicines, including creams, only as told by your health care provider.  Take your antibiotic medicine as told by your health  care provider. Do not stop taking the antibiotic even if you start to feel better.  Do not have sex until 7-10 days after you finish your medicine, or until your health care provider approves. Ask your health care provider when you may start to have sex again.  (Women) Do not douche or wear tampons while you have the infection.  Discuss your infection with your sexual partner(s). Make sure that your partner gets tested and treated, if necessary.  Keep all follow-up visits as told by your health care provider. This is important. How is this prevented?   Use condoms every time you have sex. Using condoms correctly and consistently can help protect against STIs.  Avoid having multiple sexual partners.  Talk with your sexual partner about any symptoms that either of you may have, as well as any history of STIs.  Get tested for STIs and STDs (sexually transmitted diseases) before you have sex. Ask your partner to do the same.  Do not have sexual contact if you have symptoms of trichomoniasis or another STI. Contact a health care provider if:  You still have symptoms after you finish your medicine.  You develop pain in your abdomen.  You have pain when you urinate.  You have bleeding after sex.  You develop a rash.  You feel nauseous or you vomit.  You plan to become pregnant or think you may be pregnant. Summary  Trichomoniasis is an STI (sexually transmitted infection) that can affect both women and men.  This condition often has no symptoms (is asymptomatic), especially in men.  Without treatment, this condition can last for months or years.  You should not have sex until 7-10 days after you finish your medicine, or until your health care provider approves. Ask your health care provider when you may start to have sex again.  Discuss your infection with your sexual partner(s). Make sure that your partner gets tested and treated, if necessary. This information is not  intended to replace advice given to you by your health care provider. Make sure you discuss any questions you have with your health care provider. Document Revised: 09/08/2018 Document Reviewed: 09/08/2018 Elsevier Patient Education  2020 [employer].

## 2020-11-27 NOTE — Assessment & Plan Note (Signed)
Elevated 150/100 today. Patient asymptomatic. She has her medication but has not taken for >month Encouraged patient to take her medication and recheck blood pressure in a couple weeks with PCP

## 2020-11-27 NOTE — Assessment & Plan Note (Signed)
Chronic. Non-tender or bleeding OTC not improving Denies constipation or straining since cholecystectomy Requesting prescription.  Filling topical hemorrhoid treatment. Consider surgical referral if not improved with conservative tx

## 2020-11-27 NOTE — Assessment & Plan Note (Addendum)
Due to age, HTN, current tobacco use, high risk of complications with estrogen OCPs which is patients first choice at this time for birth control Discussed risks/benefits of contraception options.  Patient chooses depo which was given today. Pap smear also collected today.  Std screening positive for [employer] sent to pharmacy. Instructed to inform partners for treatment as well

## 2020-11-27 NOTE — Progress Notes (Signed)
    SUBJECTIVE:   CHIEF COMPLAINT / HPI: STD screening, birth control, pap  Cervical cancer screening-last Pap smear was 06/2015 and was normal  STD screening- no symptoms. Multiple sexual partners and inconsistent condom use.   Contraceptive management- last period started Dec 17th and ended yesterday. Used implanon in the past which worked well but doesn't want again. Would like to start OCPs today.  BP- needs refill on her medication. She has been out for a month. Asymptomatic.   External hemorrhoids- non-bleeding, non-tender. Does not strain with BMs or have constipation. Used OTC treatments without improvement.   OBJECTIVE:   BP (!) 150/100   Pulse (!) 109   Wt 223 lb 12.8 oz (101.5 kg)   SpO2 98%   BMI 38.42 kg/m   General: NAD, pleasant, able to participate in exam Cardiac: RRR, normal heart sounds, no murmurs. 2+ radial and PT pulses bilaterally Respiratory: CTAB, normal effort, No wheezes, rales or rhonchi Pelvic: negative for external lesions.  Positive bartholin cyst at 10oclock on cervix.  Increased mucoid discharge.  Extremities: no edema. WWP. Skin: warm and dry, no rashes noted Neuro: alert and oriented, no focal deficits Psych: Normal affect and mood  ASSESSMENT/PLAN:   Cervical cancer screening Pap smear collected today  Screen for STD (sexually transmitted disease) Due to age, HTN, current tobacco use, high risk of complications with estrogen OCPs which is patients first choice at this time for birth control Discussed risks/benefits of contraception options.  Patient chooses depo which was given today. Pap smear also collected today.  Std screening positive for [employer] sent to pharmacy. Instructed to inform partners for treatment as well  Essential hypertension, benign Elevated 150/100 today. Patient asymptomatic. She has her medication but has not taken for >month Encouraged patient to take her medication and recheck blood pressure in a couple  weeks with PCP  Hemorrhoids, external Chronic. Non-tender or bleeding OTC not improving Denies constipation or straining since cholecystectomy Requesting prescription.  Filling topical hemorrhoid treatment. Consider surgical referral if not improved with conservative tx     Lisa Bock, DO [facility]

## 2020-11-28 LAB — HEPATITIS C ANTIBODY: Hep C Virus Ab: 0.3 s/co ratio (ref 0.0–0.9)

## 2020-11-28 LAB — RPR: RPR Ser Ql: NONREACTIVE

## 2020-11-28 LAB — HIV ANTIBODY (ROUTINE TESTING W REFLEX): HIV Screen 4th Generation wRfx: NONREACTIVE

## 2020-11-29 LAB — CYTOLOGY - PAP
Chlamydia: NEGATIVE
Comment: NEGATIVE
Comment: NEGATIVE
Comment: NORMAL
High risk HPV: POSITIVE — AB
Neisseria Gonorrhea: NEGATIVE

## 2020-12-05 ENCOUNTER — Ambulatory Visit
Admission: EM | Admit: 2020-12-05 | Discharge: 2020-12-05 | Discharge status: Against Medical Advice | Payer: BC Managed Care – PPO

## 2020-12-05 ENCOUNTER — Other Ambulatory Visit: Payer: Self-pay

## 2020-12-14 ENCOUNTER — Other Ambulatory Visit (HOSPITAL_COMMUNITY)
Admission: RE | Admit: 2020-12-14 | Discharge: 2020-12-14 | Disposition: A | Discharge status: Home / Self Care | Payer: BC Managed Care – PPO | Source: Ambulatory Visit | Attending: Family Medicine | Admitting: Family Medicine

## 2020-12-14 ENCOUNTER — Ambulatory Visit: Payer: BC Managed Care – PPO | Admitting: Family Medicine

## 2020-12-14 ENCOUNTER — Encounter: Payer: Self-pay | Admitting: Family Medicine

## 2020-12-14 ENCOUNTER — Other Ambulatory Visit: Payer: Self-pay

## 2020-12-14 ENCOUNTER — Other Ambulatory Visit: Payer: Self-pay | Admitting: Family Medicine

## 2020-12-14 VITALS — BP 140/96 | HR 80 | Wt 221.0 lb

## 2020-12-14 DIAGNOSIS — R87612 Low grade squamous intraepithelial lesion on cytologic smear of cervix (LGSIL): Secondary | ICD-10-CM | POA: Diagnosis not present

## 2020-12-14 DIAGNOSIS — Z113 Encounter for screening for infections with a predominantly sexual mode of transmission: Secondary | ICD-10-CM

## 2020-12-14 DIAGNOSIS — R87619 Unspecified abnormal cytological findings in specimens from cervix uteri: Secondary | ICD-10-CM

## 2020-12-14 DIAGNOSIS — N898 Other specified noninflammatory disorders of vagina: Secondary | ICD-10-CM | POA: Diagnosis not present

## 2020-12-14 DIAGNOSIS — N889 Noninflammatory disorder of cervix uteri, unspecified: Secondary | ICD-10-CM

## 2020-12-14 DIAGNOSIS — R87610 Atypical squamous cells of undetermined significance on cytologic smear of cervix (ASC-US): Secondary | ICD-10-CM | POA: Insufficient documentation

## 2020-12-14 DIAGNOSIS — N888 Other specified noninflammatory disorders of cervix uteri: Secondary | ICD-10-CM | POA: Insufficient documentation

## 2020-12-14 LAB — POCT WET PREP (WET MOUNT)
Clue Cells Wet Prep Whiff POC: NEGATIVE
Trichomonas Wet Prep HPF POC: ABSENT

## 2020-12-14 LAB — POCT URINE PREGNANCY: Preg Test, Ur: NEGATIVE

## 2020-12-14 MED ORDER — FLUCONAZOLE 150 MG PO TABS: 150.0000 mg | ORAL_TABLET | Freq: Once | ORAL | 0 refills | Status: AC

## 2020-12-14 NOTE — Addendum Note (Signed)
Addended by: Janit Pagan T on: 12/14/2020 12:38 PM   Modules accepted: Orders

## 2020-12-14 NOTE — Assessment & Plan Note (Addendum)
Visualized during colposcopy 12/14/2020. Large cyst at 9 o'clock cervical position overlying transition zone.

## 2020-12-14 NOTE — Progress Notes (Signed)
    SUBJECTIVE:   CHIEF COMPLAINT / HPI:   Abnormal PAP smear Ms. 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 Previous PAP smears in 2016 and 2013 negative for abnormalities and HPV. Patient presents today for recommended colposcopy.   STD testing Patient also requests a vaginal swab for STD testing. She declines blood sample for HIV/RPR. No rashes, lesions, discomfort, pain, fever, or chills. Simply wanting to have STD screen prior to becoming intimate with a new partner.   PERTINENT  PMH / PSH: HTN, external hemorrhoids, GAD  OBJECTIVE:   BP (!) 140/96 (BP Location: Right Arm, Patient Position: Sitting, Cuff Size: Large)   Pulse 80   Wt 221 lb (100.2 kg)   BMI 37.93 kg/m   General: Well-appearing 40 year old female GU: Satisfactory transition zone, large Nabothian cyst at 9 o'clock overlying transition zone, decreased iodine uptake in the 11 o'clock through 2 o'clock position.  Colposcopy Preoperative diagnosis: Cervical changes Postoperative diagnosis: Cervical changes, Nabothian cyst  Procedure: Colposcopy of Cervix Surgeon: Dr. Fayette Pho Supervisor: Dr. Janit Pagan Preprocedure counseling: The risks, benefits, and alternatives of the procedure were discussed with the patient.   EBL: 10 ml Anesthesia: None  Procedure: Consent and a timeout were performed prior to starting the procedure. The patient was administered 600 mg of ibuprofen PO.  The patient was placed in dorsal lithotomy position.  The perineum, vulva and vagina were normal.  Speculum introduced and cervix identified.  The colposcope was advanced.  Acetic acid applied x 2 to the cervix.  Lugol's iodine applied x2 to the cervix. ECC performed and tissue scrapings placed in sterile saline solution. Next biopsies were obtained at the 11, 12, and 2 o'clock positions. Monsel was applied for hemostasis was achieved. Speculum removed.  Patient reported pain after the procedure but otherwise tolerated well.  Lesions identified: 1. Large Nabothian cyst at 9 o'clock overlying transition zone 2. Continuous region of decreased iodine uptake from 11 o'clock through 2 o'clock position. 3. Three biopsies obtained in total: 11, 12, and 2 o'clock positions  The patient tolerated the procedure well.  Return precautions provided.  Return to the office in 2-3 weeks for reevaluation.    Fayette Pho, M.D.  Family Medicine  PGY-1 12/14/2020 11:59 AM   ASSESSMENT/PLAN:   Lesion of cervix Colposcopy performed today, 3 lesions biopsied. Will call patient with results when they return. -Strict return precautions given -Pain control with 400 mg ibuprofen  Screen for STD (sexually transmitted disease) Patient requested vaginal swab testing for STDs. Will check for GC, chlamydia, trichomonas. Pt declines blood sample for HIV and RPR.   Nabothian cyst Visualized during colposcopy 12/14/2020. Large cyst at 9 o'clock cervical position overlying transition zone.      Fayette Pho, MD [facility]

## 2020-12-14 NOTE — Patient Instructions (Addendum)
It was wonderful to meet you today. Thank you for allowing me to be a part of your care. Below is a short summary of what we discussed at your visit today:  Colposcopy Today we performed a procedure called colposcopy. During this procedure, we take small tissue samples from the cervix. The samples are sent to a lab where they will be evaluated under a microscope. For normal results, we will either send you a MyChart message or a letter. If there are any abnormal results, we will call you with the results and a plan going forward. You should expect to have a little spotting for a day or so and some discomfort up to a couple days. You may alternate tylenol and ibuprofen for pain relief.   STD testing You requested STD testing today. We swabbed your vagina to test for gonorrhea, chlamydia, trichomonas. For normal results, we will either send you a MyChart message or a letter. If there are any abnormal results, we will call you with the results and a plan going forward.    If you have any questions or concerns, please do not hesitate to contact us via phone or MyChart message.   Fayette Pho, MD    Colposcopy, Care After This sheet gives you information about how to care for yourself after your procedure. Your doctor may also give you more specific instructions. If you have problems or questions, contact your doctor. What can I expect after the procedure? If you did not have a tissue sample removed (did not have a biopsy), you may only have some spotting for a few days. You can go back to your normal activities. If you had a tissue sample removed, it is common to have:  Soreness and pain. This may last for a few days.  Light-headedness.  Mild bleeding from your vagina or dark-colored, grainy discharge from your vagina. This may last for a few days. You may need to wear a sanitary pad.  Spotting for at least 48 hours after the procedure. Follow these instructions at home:   Take  over-the-counter and prescription medicines only as told by your doctor. Ask your doctor what medicines you can start taking again. This is very important if you take blood-thinning medicine.  Do not drive or use heavy machinery while taking prescription pain medicine.  For 3 days, or as long as your doctor tells you, avoid: ? Douching. ? Using tampons. ? Having sex.  If you use birth control (contraception), keep using it.  Limit activity for the first day after the procedure. Ask your doctor what activities are safe for you.  It is up to you to get the results of your procedure. Ask your doctor when your results will be ready.  Keep all follow-up visits as told by your doctor. This is important. Contact a doctor if:  You get a skin rash. Get help right away if:  You are bleeding a lot from your vagina. It is a lot of bleeding if you are using more than one pad an hour for 2 hours in a row.  You have clumps of blood (blood clots) coming from your vagina.  You have a fever.  You have chills  You have pain in your lower belly (pelvic area).  You have signs of infection, such as vaginal discharge that is: ? Different than usual. ? Yellow. ? Bad-smelling.  You have very pain or cramps in your lower belly that do not get better with medicine.  You  feel light-headed.  You feel dizzy.  You pass out (faint). Summary  If you did not have a tissue sample removed (did not have a biopsy), you may only have some spotting for a few days. You can go back to your normal activities.  If you had a tissue sample removed, it is common to have mild pain and spotting for 48 hours.  For 3 days, or as long as your doctor tells you, avoid douching, using tampons and having sex.  Get help right away if you have bleeding, very bad pain, or signs of infection. This information is not intended to replace advice given to you by your health care provider. Make sure you discuss any questions you  have with your health care provider. Document Revised: 11/07/2017 Document Reviewed: 08/14/2016 Elsevier Patient Education  2020 [employer].

## 2020-12-14 NOTE — Assessment & Plan Note (Signed)
Patient requested vaginal swab testing for STDs. Will check for GC, chlamydia, trichomonas. Pt declines blood sample for HIV and RPR.

## 2020-12-14 NOTE — Assessment & Plan Note (Addendum)
Colposcopy performed today, 3 lesions biopsied. Will call patient with results when they return. -Strict return precautions given -Pain control with 400 mg ibuprofen

## 2020-12-15 ENCOUNTER — Telehealth: Payer: Self-pay | Admitting: Family Medicine

## 2020-12-15 LAB — CERVICOVAGINAL ANCILLARY ONLY
Bacterial Vaginitis (gardnerella): NEGATIVE
Candida Glabrata: NEGATIVE
Candida Vaginitis: NEGATIVE
Chlamydia: NEGATIVE
Comment: NEGATIVE
Comment: NEGATIVE
Comment: NEGATIVE
Comment: NEGATIVE
Comment: NEGATIVE
Comment: NORMAL
Neisseria Gonorrhea: NEGATIVE
Trichomonas: NEGATIVE

## 2020-12-15 NOTE — Telephone Encounter (Signed)
I called and discussed her results with her. All test negative (GC/Chlam, Trich, BV, and Yeast).  Of note, the POC wet prep was done in the clinic yesterday for the same specimen, came back positive for yeast, and I already escribed Diflucan. I discussed the option of not taking the medication with the patient if she feels okay. She opted not to take the prescribed Diflucan.  I called the pharmacist Kenyon Ana) and canceled this prescription.

## 2020-12-15 NOTE — Telephone Encounter (Signed)
-----   Message from Westley Chandler, MD sent at 12/15/2020 11:19 AM EST -----  ----- Message ----- From: Interface, [facility] In [facility] Sent: 12/15/2020  11:07 AM EST To: Westley Chandler, MD

## 2020-12-17 ENCOUNTER — Other Ambulatory Visit: Payer: Self-pay | Admitting: Family Medicine

## 2020-12-19 ENCOUNTER — Telehealth: Payer: Self-pay | Admitting: Family Medicine

## 2020-12-19 NOTE — Telephone Encounter (Signed)
Colposcopy biopsy report discussed. 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 HM updated to reflect this. All questions were answered.

## 2021-06-27 ENCOUNTER — Other Ambulatory Visit: Payer: Self-pay

## 2021-06-27 ENCOUNTER — Ambulatory Visit
Admission: EM | Admit: 2021-06-27 | Discharge: 2021-06-27 | Disposition: A | Discharge status: Home / Self Care | Payer: BC Managed Care – PPO | Attending: Family Medicine | Admitting: Family Medicine

## 2021-06-27 DIAGNOSIS — N760 Acute vaginitis: Secondary | ICD-10-CM

## 2021-06-27 DIAGNOSIS — I10 Essential (primary) hypertension: Secondary | ICD-10-CM

## 2021-06-27 DIAGNOSIS — Z3202 Encounter for pregnancy test, result negative: Secondary | ICD-10-CM

## 2021-06-27 DIAGNOSIS — R42 Dizziness and giddiness: Secondary | ICD-10-CM

## 2021-06-27 LAB — POCT URINALYSIS DIP (MANUAL ENTRY)
Bilirubin, UA: NEGATIVE
Glucose, UA: NEGATIVE mg/dL
Ketones, POC UA: NEGATIVE mg/dL
Leukocytes, UA: NEGATIVE
Nitrite, UA: NEGATIVE
Protein Ur, POC: NEGATIVE mg/dL
Spec Grav, UA: 1.02 (ref 1.010–1.025)
Urobilinogen, UA: 0.2 E.U./dL
pH, UA: 6 (ref 5.0–8.0)

## 2021-06-27 LAB — POCT FASTING CBG KUC MANUAL ENTRY: POCT Glucose (KUC): 96 mg/dL (ref 70–99)

## 2021-06-27 LAB — POCT URINE PREGNANCY: Preg Test, Ur: NEGATIVE

## 2021-06-27 MED ORDER — AMLODIPINE BESYLATE 5 MG PO TABS: 5.0000 mg | ORAL_TABLET | Freq: Every day | ORAL | 0 refills | Status: DC

## 2021-06-27 NOTE — ED Triage Notes (Signed)
Pt presents with multiple complaints , states that she ran out of bp meds and has taken her moms for past 3 days, has had dizzy spells and feeling unwell, also want std  screen, having white odorous discharge
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
## 2021-06-28 ENCOUNTER — Telehealth (HOSPITAL_COMMUNITY): Payer: Self-pay

## 2021-06-28 LAB — CERVICOVAGINAL ANCILLARY ONLY
Bacterial Vaginitis (gardnerella): POSITIVE — AB
Candida Glabrata: NEGATIVE
Candida Vaginitis: POSITIVE — AB
Chlamydia: NEGATIVE
Comment: NEGATIVE
Comment: NEGATIVE
Comment: NEGATIVE
Comment: NEGATIVE
Comment: NEGATIVE
Comment: NORMAL
Neisseria Gonorrhea: NEGATIVE
Trichomonas: NEGATIVE

## 2021-06-28 MED ORDER — FLUCONAZOLE 150 MG PO TABS: 150.0000 mg | ORAL_TABLET | Freq: Every day | ORAL | 0 refills | Status: AC

## 2021-06-28 MED ORDER — METRONIDAZOLE 500 MG PO TABS: 500.0000 mg | ORAL_TABLET | Freq: Two times a day (BID) | ORAL | 0 refills | Status: DC

## 2021-07-09 ENCOUNTER — Telehealth: Payer: Self-pay | Admitting: Urgent Care

## 2021-07-09 MED ORDER — FLUCONAZOLE 150 MG PO TABS: 150.0000 mg | ORAL_TABLET | ORAL | 0 refills | Status: DC

## 2021-07-09 NOTE — Telephone Encounter (Signed)
Patient called and reported that they did not have her fluconazole for her when she went to the pharmacy.  Prescription resent for fluconazole.  I did confirm that it was already sent on 06/28/2021.  [facility] on [address].

## 2021-07-09 NOTE — Telephone Encounter (Signed)
Patient called again to request that the fluconazole prescription be [facility] to [facility] on [address].

## 2022-01-12 ENCOUNTER — Telehealth: Payer: Self-pay | Admitting: Nurse Practitioner

## 2022-01-12 DIAGNOSIS — N3000 Acute cystitis without hematuria: Secondary | ICD-10-CM

## 2022-01-12 MED ORDER — CEPHALEXIN 500 MG PO CAPS: 500.0000 mg | ORAL_CAPSULE | Freq: Two times a day (BID) | ORAL | 0 refills | Status: DC

## 2022-01-12 NOTE — Addendum Note (Signed)
Addended by: Chevis Pretty on: 01/12/2022 12:47 PM   Modules accepted: Orders

## 2022-01-12 NOTE — Progress Notes (Signed)

## 2022-05-14 ENCOUNTER — Encounter: Payer: Self-pay | Admitting: *Deleted

## 2022-09-05 ENCOUNTER — Telehealth: Payer: Self-pay | Admitting: Family Medicine

## 2022-09-05 MED ORDER — AMLODIPINE BESYLATE 5 MG PO TABS: 5.0000 mg | ORAL_TABLET | Freq: Every day | ORAL | 0 refills | Status: DC

## 2022-09-05 NOTE — Telephone Encounter (Signed)
Patient here in office for her child's wcc. I was contacted by CMA who reported patient had requested blood pressure check. BP was 184/125. Patient reportedly out of medications and has no insurance and is requesting refill. Advised to schedule appointment - scheduled for access to care clinic tomorrow. I sent in a refill on amlodipine for her. I did not see or personally evaluate patient.   Leeanne Rio, MD

## 2022-09-06 ENCOUNTER — Other Ambulatory Visit: Payer: Self-pay

## 2022-09-06 ENCOUNTER — Ambulatory Visit (INDEPENDENT_AMBULATORY_CARE_PROVIDER_SITE_OTHER): Payer: Self-pay | Admitting: Student

## 2022-09-06 VITALS — BP 134/108 | HR 92 | Wt 229.8 lb

## 2022-09-06 DIAGNOSIS — F411 Generalized anxiety disorder: Secondary | ICD-10-CM

## 2022-09-06 DIAGNOSIS — I10 Essential (primary) hypertension: Secondary | ICD-10-CM

## 2022-09-06 MED ORDER — ESCITALOPRAM OXALATE 10 MG PO TABS: 10.0000 mg | ORAL_TABLET | Freq: Every day | ORAL | 2 refills | Status: DC

## 2022-09-06 MED ORDER — AMLODIPINE-OLMESARTAN 5-20 MG PO TABS: 1.0000 | ORAL_TABLET | Freq: Every day | ORAL | 1 refills | Status: DC

## 2022-09-06 MED ORDER — BUSPIRONE HCL 5 MG PO TABS: 5.0000 mg | ORAL_TABLET | Freq: Three times a day (TID) | ORAL | 1 refills | Status: DC

## 2022-09-06 NOTE — Progress Notes (Signed)
SUBJECTIVE:   CHIEF COMPLAINT / HPI:   Hypertension Patient has a longstanding history of hypertension, previously treated on amlodipine 5 mg daily.  Has not been on any medications for several weeks to months.  She was in our office yesterday for her child's well-child check and asked that her BP be taken.  Was noted to have a blood pressure of 184/125.  Dr. Pollie Meyer sent in a refill of her amlodipine but this went to a different pharmacy than she was planning on picking it up at so she did not pick it up.  She took some of her mother's blood pressure medicine, she is unsure what medication or dose and felt "like my blood pressure dropped too fast or something."  Anxiety Longstanding issue for her.  Previously managed by Dr. Gwendolyn Grant.  Has used both Celexa and Lexapro as controller medications.  Lexapro has worked for her in the past.  She reports that she had suicidal ideation while taking Celexa so would not like to restart that.  She is open to restarting Lexapro.  She has been in therapy in the past but is not amenable to therapy at this time.  She reports that things are going well at home, she has a new boyfriend who is 22 years old and is able to provide for many of her basic needs.  It seems that a lot of her anxiety is related to how things are going at work.  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  She has also been on Xanax in the past and found that this made her feel "really good."  She is hopeful that I will be able to prescribe something that is shorter acting and can has a quicker onset than the Lexapro itself.   OBJECTIVE:   BP (!) 134/108   Pulse 92   Wt 229 lb 12.8 oz (104.2 kg)   SpO2 98%   BMI 39.45 kg/m   Physical Exam Vitals reviewed.  Constitutional:      Comments: Anxious and tearful.   Cardiovascular:     Rate and Rhythm: Normal rate and regular rhythm.     Heart sounds: No murmur heard. Pulmonary:     Effort: Pulmonary effort is normal. No respiratory distress.     Breath sounds: Normal breath sounds. No wheezing or rales.  Musculoskeletal:     Right lower leg: No edema.     Left lower leg: No edema.  Skin:    General: Skin is warm and dry.  Neurological:     General: No focal deficit present.  Psychiatric:     Comments: Anxious mood and affect      ASSESSMENT/PLAN:   Essential hypertension, benign Markedly elevated in clinic yesterday.  Remains elevated today after she took an unknown blood pressure medicine from her mother's supply.  Suspect that she will need multidrug therapy given degree of blood pressure elevation.  We will plan to start her on amlodipine-olmesartan 5-20 mg daily.  Discussed teratogenicity of olmesartan.  She and her boyfriend are unable to have penetrative intercourse given his age and she therefore denies any risk of pregnancy. -Amlodipine-olmesartan 5-20 mg -BMP today -Follow-up in 2 weeks, repeat BMP at that visit  Generalized anxiety disorder Poorly controlled off of all medications.  Will restart Lexapro, initially  at 10 mg dose, anticipate rapid escalation to 20 mg dose at follow-up.  We will also add BuSpar for quick acting medication, would like to avoid benzos if at all possible.  Patient resistant to therapy at this time, will engage in ongoing conversations as ideally would love for her to be in therapy.     Pearla Dubonnet, MD [place]

## 2022-09-06 NOTE — Patient Instructions (Addendum)
Ms Donalson,  It is such a joy to meet you!  I am going to enjoy being your doctor!  I am starting you on a new blood pressure medicine.  I need to see you back on October 17 to recheck your kidney function and check in on your anxiety.  We can also do an STD check at that visit.  In the meantime, please take your Lexapro every day and you can take the BuSpar as needed for anxiety symptoms.  I will see you back in a few weeks  Pearla Dubonnet, MD

## 2022-09-06 NOTE — Assessment & Plan Note (Addendum)
Poorly controlled off of all medications.  Will restart Lexapro, initially at 10 mg dose, anticipate rapid escalation to 20 mg dose at follow-up.  We will also add BuSpar for quick acting medication, would like to avoid benzos if at all possible.  Patient resistant to therapy at this time, will engage in ongoing conversations as ideally would love for her to be in therapy.

## 2022-09-06 NOTE — Assessment & Plan Note (Addendum)
Markedly elevated in clinic yesterday.  Remains elevated today after she took an unknown blood pressure medicine from her mother's supply.  Suspect that she will need multidrug therapy given degree of blood pressure elevation.  We will plan to start her on amlodipine-olmesartan 5-20 mg daily.  Discussed teratogenicity of olmesartan.  She and her boyfriend are unable to have penetrative intercourse given his age and she therefore denies any risk of pregnancy. -Amlodipine-olmesartan 5-20 mg -BMP today -Follow-up in 2 weeks, repeat BMP at that visit

## 2022-09-07 LAB — BASIC METABOLIC PANEL
BUN/Creatinine Ratio: 13 (ref 9–23)
BUN: 15 mg/dL (ref 6–20)
CO2: 20 mmol/L (ref 20–29)
Calcium: 9.6 mg/dL (ref 8.7–10.2)
Chloride: 104 mmol/L (ref 96–106)
Creatinine, Ser: 1.15 mg/dL — ABNORMAL HIGH (ref 0.57–1.00)
Glucose: 97 mg/dL (ref 70–99)
Potassium: 4.4 mmol/L (ref 3.5–5.2)
Sodium: 140 mmol/L (ref 134–144)
eGFR: 63 mL/min/{1.73_m2} (ref >59)

## 2022-09-16 NOTE — Progress Notes (Addendum)
    SUBJECTIVE:   CHIEF COMPLAINT / HPI:   41 y.o.  year old female presents with complaint of vaginal discharge that started 2 weeks ago.  She denies increased frequency and urgency.  She is sexually active with one partner and inconsistence with protection. Denies irritation, burning sensation or itchiness but endorses odor.  No hematuria or recent change in medication.  Pap Smear Patient is due for Pap smear. 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 LMP was around 08/18/2022  Anxiety Patient was recently restarted lexaprol and Buspar added at her last visit a month ago for anxiety.  Patient expressed interest to change to medications today because they make her sleepy. She endorses mild improvement with the medication. Reports multiple stressors in her life at this time which include daughter getting a car and relationship with father of her daughter.    PERTINENT  PMH / PSH: Reviewed  OBJECTIVE:   BP (!) 154/94   Pulse 85   Ht 5\' 4"  (1.626 m)   Wt 234 lb 2 oz (106.2 kg)   LMP 08/09/2022   SpO2 96%   BMI 40.19 kg/m    Physical Exam General: Alert, well appearing, NAD Cardiovascular: RRR, No Murmurs, Normal S2/S2 Respiratory: CTAB, No wheezing or Rales Abdomen: No distension or tenderness Pelvic exam: notable discharge from the cervical ox .  Normal vulva-vaginal wall without any surrounding erythema  ? ?CMA Teshira served as Producer, television/film/video for this exam  ASSESSMENT/PLAN:   STD screening Sexually active patient  reports vaginal discharge and odor.On exam has white discharge from the cervical ox otherwise the rest of her exam was unremarkable.  Pregnancy test was negative. -Obtained lab for GC, Chlamydia, Trichomonas and Candida.  HCM -Due for pap smear. Pap smear collected today.  Generalized anxiety disorder Patient reports mild improvement on current medication.  Does endorse increased drowsiness with medication.  Advised patient to take her medications at night before bed time and to discuss interest in changing medication with PCP at her next visit in a week. Could consider switch to zoloft.   Elevated blood pressure She has blood pressure today was 154/94.  She reports she had not taken her blood pressure medications today.  Encourage patients to be compliant with her medications.    Alen Bleacher, MD [place]

## 2022-09-17 ENCOUNTER — Ambulatory Visit (INDEPENDENT_AMBULATORY_CARE_PROVIDER_SITE_OTHER): Payer: Self-pay | Admitting: Student

## 2022-09-17 ENCOUNTER — Other Ambulatory Visit (HOSPITAL_COMMUNITY)
Admission: RE | Admit: 2022-09-17 | Discharge: 2022-09-17 | Disposition: A | Discharge status: Home / Self Care | Payer: Self-pay | Source: Ambulatory Visit | Attending: Family Medicine | Admitting: Family Medicine

## 2022-09-17 VITALS — BP 154/94 | HR 85 | Ht 64.0 in | Wt 234.1 lb

## 2022-09-17 DIAGNOSIS — Z113 Encounter for screening for infections with a predominantly sexual mode of transmission: Secondary | ICD-10-CM

## 2022-09-17 DIAGNOSIS — Z3200 Encounter for pregnancy test, result unknown: Secondary | ICD-10-CM

## 2022-09-17 DIAGNOSIS — Z124 Encounter for screening for malignant neoplasm of cervix: Secondary | ICD-10-CM | POA: Insufficient documentation

## 2022-09-17 DIAGNOSIS — F411 Generalized anxiety disorder: Secondary | ICD-10-CM

## 2022-09-17 LAB — POCT URINE PREGNANCY: Preg Test, Ur: NEGATIVE

## 2022-09-17 NOTE — Assessment & Plan Note (Signed)
Patient reports mild improvement on current medication.  Does endorse increased drowsiness with medication. Advised patient to take her medications at night before bed time and to discuss interest in changing medication with PCP at her next visit in a week. Could consider switch to zoloft.

## 2022-09-17 NOTE — Patient Instructions (Addendum)
It was wonderful to meet you today. Thank you for allowing me to be a part of your care. Below is a short summary of what we discussed at your visit today:  We obtained lab test for gonorrhea, chlamydia, trichomonas and fungi infection.  Will follow-up with you if you have abnormal result.  We also did your pap smear today.  Blood pressure today is elevated.  Please continue to take your blood pressures as prescribed.  Please follow up with your PCP about about change in anxiety medications (Zoloft) if drowsiness continues to be a problem.  Please bring all of your medications to every appointment!  If you have any questions or concerns, please do not hesitate to contact us via phone or MyChart message.   Alen Bleacher, MD [facility]

## 2022-09-18 LAB — CYTOLOGY - PAP
Chlamydia: NEGATIVE
Comment: NEGATIVE
Comment: NEGATIVE
Comment: NEGATIVE
Comment: NORMAL
Diagnosis: UNDETERMINED — AB
High risk HPV: POSITIVE — AB
Neisseria Gonorrhea: NEGATIVE
Trichomonas: POSITIVE — AB

## 2022-09-19 ENCOUNTER — Other Ambulatory Visit: Payer: Self-pay | Admitting: Student

## 2022-09-19 DIAGNOSIS — A5901 Trichomonal vulvovaginitis: Secondary | ICD-10-CM

## 2022-09-19 MED ORDER — METRONIDAZOLE 500 MG PO TABS
500.0000 mg | ORAL_TABLET | Freq: Two times a day (BID) | ORAL | 0 refills | Status: AC
Start: 2022-09-19 — End: 2022-09-26

## 2022-09-19 NOTE — Progress Notes (Signed)
Note patient to inform her that her test showed that she was positive for trichomonas.  Prescribed metronidazole 500 mg which patient will take twice daily for 7 days.  Advised patient that her sexual partner will need to be treated as well.  Patient verbalized understanding and agreeable to plan.

## 2022-09-24 ENCOUNTER — Ambulatory Visit: Payer: Self-pay | Admitting: Student

## 2022-10-10 ENCOUNTER — Ambulatory Visit (INDEPENDENT_AMBULATORY_CARE_PROVIDER_SITE_OTHER): Payer: Self-pay | Admitting: Student

## 2022-10-10 VITALS — BP 137/110 | HR 113

## 2022-10-10 DIAGNOSIS — F411 Generalized anxiety disorder: Secondary | ICD-10-CM

## 2022-10-10 DIAGNOSIS — M545 Low back pain, unspecified: Secondary | ICD-10-CM

## 2022-10-10 DIAGNOSIS — I10 Essential (primary) hypertension: Secondary | ICD-10-CM

## 2022-10-10 DIAGNOSIS — R87610 Atypical squamous cells of undetermined significance on cytologic smear of cervix (ASC-US): Secondary | ICD-10-CM

## 2022-10-10 DIAGNOSIS — R87810 Cervical high risk human papillomavirus (HPV) DNA test positive: Secondary | ICD-10-CM

## 2022-10-10 MED ORDER — CITALOPRAM HYDROBROMIDE 20 MG PO TABS: 20.0000 mg | ORAL_TABLET | Freq: Every day | ORAL | 1 refills | Status: DC

## 2022-10-10 MED ORDER — BUSPIRONE HCL 10 MG PO TABS: 10.0000 mg | ORAL_TABLET | Freq: Three times a day (TID) | ORAL | 1 refills | Status: DC

## 2022-10-10 MED ORDER — AMLODIPINE BESYLATE 10 MG PO TABS: 10.0000 mg | ORAL_TABLET | Freq: Every day | ORAL | 1 refills | Status: DC

## 2022-10-10 NOTE — Patient Instructions (Signed)
Lorenda,  Take some time to yourself. I sent higher dose buspar and some celexa to your pharmacy. I will see you back here tomorrow at 11:10am. See the note I provided to give your boss.  See you tomorrow,  Pearla Dubonnet, MD

## 2022-10-11 ENCOUNTER — Ambulatory Visit: Payer: Self-pay | Admitting: Student

## 2022-10-11 NOTE — Assessment & Plan Note (Signed)
Experiencing acute anxiety today. Needs refill of buspar. Will need to trial another maintenance med given failure of Lexapro. Per chart review, was on Celexa with success in the past.  - Refilled buspar 10mg  TID PRN - Celexa 20mg  daily - Encouraged to take some time to herself this evening, engage in self-care, and will see back tomorrow

## 2022-10-11 NOTE — Assessment & Plan Note (Signed)
Needs colpo.  - Will schedule in colpo clinic

## 2022-10-11 NOTE — Progress Notes (Signed)
    SUBJECTIVE:   CHIEF COMPLAINT / HPI:   Anxiety Patient here with acute anxiety attack with panic symptoms in the setting of soured interpersonal relationships in her romantic life. Says she has a hard time "closing the door on a previous chapter" of life. Is feeling overwhelmed, feels her anxiety is affecting her ability to perform at work. At our last visit, I started her on lexapro and gave her some buspar for PRN use. She reports that she flushed the Lexapro as it was giving her crazy dreams, but that the buspar was very helpful. She ran out shortly before this present episode. Denies any thoughts of self-harm or suicide despite answering 3 to question 9 on PHQ-9.   Hypertension Has not been taking Amlodipine-Olmesartan combo pill as she does not like the idea of combo med. Requests refill of amlodipine 10mg .   ASCUS with High Risk HPV Needs colpo. Discussed today. She is amenable to plan.   OBJECTIVE:   BP (!) 137/110   Pulse (!) 113   LMP 08/09/2022   SpO2 98%   Gen: Tearful and animated HEENT: normocephalic, atraumatic, EOM grossly intact, oral mucosa moist, neck supple Respiratory: normal respiratory effort GI: non-distended Skin: no rashes, no jaundice Psych: anxious and tearful   ASSESSMENT/PLAN:   ASCUS with positive high risk HPV cervical Needs colpo.  - Will schedule in colpo clinic  Generalized anxiety disorder Experiencing acute anxiety today. Needs refill of buspar. Will need to trial another maintenance med given failure of Lexapro. Per chart review, was on Celexa with success in the past.  - Refilled buspar 10mg  TID PRN - Celexa 20mg  daily - Encouraged to take some time to herself this evening, engage in self-care, and will see back tomorrow  Essential hypertension, benign Not taking any meds given unwillingness to take combo pill. Per her request will refill Amlodipine 10mg  daily. Likely she needs another agent, but will address this when she is less  emotionally labile.      Pearla Dubonnet, MD [facility]

## 2022-10-11 NOTE — Assessment & Plan Note (Signed)
Not taking any meds given unwillingness to take combo pill. Per her request will refill Amlodipine 10mg  daily. Likely she needs another agent, but will address this when she is less emotionally labile.

## 2022-10-24 ENCOUNTER — Ambulatory Visit: Payer: Self-pay | Admitting: Student

## 2022-12-09 DIAGNOSIS — Z419 Encounter for procedure for purposes other than remedying health state, unspecified: Secondary | ICD-10-CM | POA: Diagnosis not present

## 2023-01-09 DIAGNOSIS — Z419 Encounter for procedure for purposes other than remedying health state, unspecified: Secondary | ICD-10-CM | POA: Diagnosis not present

## 2023-02-07 DIAGNOSIS — Z419 Encounter for procedure for purposes other than remedying health state, unspecified: Secondary | ICD-10-CM | POA: Diagnosis not present

## 2023-02-24 ENCOUNTER — Other Ambulatory Visit: Payer: Self-pay | Admitting: Student

## 2023-02-24 DIAGNOSIS — F411 Generalized anxiety disorder: Secondary | ICD-10-CM

## 2023-03-10 DIAGNOSIS — Z419 Encounter for procedure for purposes other than remedying health state, unspecified: Secondary | ICD-10-CM | POA: Diagnosis not present

## 2023-03-13 ENCOUNTER — Telehealth: Payer: Self-pay | Admitting: Student

## 2023-03-13 DIAGNOSIS — F411 Generalized anxiety disorder: Secondary | ICD-10-CM

## 2023-03-13 NOTE — Telephone Encounter (Signed)
Patient calls the office in tears, connected to me by front office staff. She tells that she is overwhelmed by her current life circumstances.   Patient with significant anxiety at baseline, generally well-controlled on Celexa 20mg  daily and Buspar 10mg  TID. However, symptoms out of control recently in the setting of multiple acute/subacute stressors. Her Dad lives in [place] and is in the process of transitioning to hospice care. She is her dad's POA and this decision weighs heavy on her. She also recently lost her job and has been without an income for >6 weeks and is now at risk of eviction. Also, her son has been sick recently which has been another stress for her.  She reassures me that she is not at risk of self-harm or harming others because "I love my life. Even on my worst days I love my life."   Discussed options for having her seen urgently: Made sure she is aware of [place] for 24 hour mental health services. She elects instead to come to clinic tomorrow. We discussed our multi-provider residency practice model and that while I am always happy to see her, this is not always possible.  Will schedule on Access to Care with Dr. Nancy Fetter tomorrow and if I am able I will come to see her during that visit.  Marnee Guarneri, MD

## 2023-03-14 ENCOUNTER — Ambulatory Visit (INDEPENDENT_AMBULATORY_CARE_PROVIDER_SITE_OTHER): Payer: Medicaid Other | Admitting: Student

## 2023-03-14 VITALS — BP 138/86 | HR 83 | Ht 64.0 in | Wt 220.0 lb

## 2023-03-14 DIAGNOSIS — Z7251 High risk heterosexual behavior: Secondary | ICD-10-CM

## 2023-03-14 DIAGNOSIS — F4325 Adjustment disorder with mixed disturbance of emotions and conduct: Secondary | ICD-10-CM

## 2023-03-14 LAB — POCT URINE PREGNANCY: Preg Test, Ur: NEGATIVE

## 2023-03-14 MED ORDER — ARIPIPRAZOLE 5 MG PO TABS: 5.0000 mg | ORAL_TABLET | Freq: Every day | ORAL | 1 refills | Status: DC

## 2023-03-14 NOTE — Patient Instructions (Addendum)
Khadeejah,  You've got a lot on your plate. I think we ought to augment your depression treatment.  Sometimes the hardest thing to do in a day is to get up and take a shower. You do so much for everyone else, take time for yourself (preferably activities with lower risks of pregnancy). Come back to see me in 2.5 weeks.   If you need urgent mental health services Please go to:  [facility] [address]  [place], [place] [address] [phone]  In the meantime, I'm adding a medicine called Abilify to help level things out a little bit. We can talk about dose adjustments or changes when you see me on the 23rd.    Eliezer Mccoy, MD

## 2023-03-17 DIAGNOSIS — F4325 Adjustment disorder with mixed disturbance of emotions and conduct: Secondary | ICD-10-CM | POA: Insufficient documentation

## 2023-03-17 NOTE — Progress Notes (Signed)
    SUBJECTIVE:   CHIEF COMPLAINT / HPI:   Acute Stress Patient with fairly significant anxiety at baseline--had been reasonably well controlled on Celexa and Buspar. However, multiple recent life stressors have sent her into a "spiral." Her mom was admitted s/p MI ~1 month ago and has recently gotten back home, doing okay. But stress compounded as she received word her father is dying in [place] and she plans to go up there to help facilitate transition to hospice. She ended up losing her job at [employer] due to issues related to her grief response with her mother, is now at risk for possible eviction. This has added financial stressors. Has noticed herself being more impulsive over this time period, particularly with hypersexuality and having sex with "the worst man" she knows.    OBJECTIVE:   BP 138/86   Pulse 83   Ht 5\' 4"  (1.626 m)   Wt 220 lb (99.8 kg)   SpO2 98%   BMI 37.76 kg/m   Gen: Obviously upset, intermittently tearful HENT: MMM, EOMs intact  Cardio: RRR, no m/r/g Pulm: Normal WOB  Psych: Depressed mood, labile affect, disorganized thought   ASSESSMENT/PLAN:   Adjustment disorder with mixed disturbance of emotions and conduct Distraught in the setting of multiple stressors. Symptoms inadequately controlled on her Celexa and Buspar. Given her lability, disorganized thought, and hypersexuality, favor augmenting with an SGA. - Will add Abilify 5mg  daily - F/u in 2 weeks - Note provided for court, documenting mental health crisis and that she is seeking treatment  - Discussed [facility] for urgent needs, she is aware of where they are and what they offer  - Upreg negative     J Dorothyann Gibbs, MD [facility]

## 2023-03-17 NOTE — Assessment & Plan Note (Addendum)
Distraught in the setting of multiple stressors. Symptoms inadequately controlled on her Celexa and Buspar. Given her lability, disorganized thought, and hypersexuality, favor augmenting with an SGA. - Will add Abilify 5mg  daily - F/u in 2 weeks - Note provided for court, documenting mental health crisis and that she is seeking treatment  - Discussed [facility] for urgent needs, she is aware of where they are and what they offer  - Upreg negative

## 2023-03-31 NOTE — Progress Notes (Deleted)
    SUBJECTIVE:   CHIEF COMPLAINT / HPI:   Seen by me on 4/5 for emotional "spiraling" in the setting of her mother's recent hospitalization and her father approaching the end of his life, transitioning to hospice care at home in [place].  At that visit, we added Abilify to her home regimen of Celexa 20 mg daily.  PERTINENT  PMH / PSH: ***  OBJECTIVE:   There were no vitals taken for this visit.  ***  ASSESSMENT/PLAN:   No problem-specific Assessment & Plan notes found for this encounter.     Eliezer Mccoy, MD [facility]

## 2023-04-01 ENCOUNTER — Ambulatory Visit: Payer: Self-pay | Admitting: Student

## 2023-04-07 ENCOUNTER — Encounter: Payer: Self-pay | Admitting: Student

## 2023-04-07 ENCOUNTER — Other Ambulatory Visit: Payer: Self-pay

## 2023-04-07 ENCOUNTER — Ambulatory Visit (INDEPENDENT_AMBULATORY_CARE_PROVIDER_SITE_OTHER): Payer: Medicaid Other | Admitting: Student

## 2023-04-07 ENCOUNTER — Other Ambulatory Visit (HOSPITAL_COMMUNITY)
Admission: RE | Admit: 2023-04-07 | Discharge: 2023-04-07 | Disposition: A | Discharge status: Home / Self Care | Payer: Medicaid Other | Source: Ambulatory Visit | Attending: Family Medicine | Admitting: Family Medicine

## 2023-04-07 VITALS — BP 132/95 | HR 99 | Ht 64.0 in | Wt 228.6 lb

## 2023-04-07 DIAGNOSIS — R87610 Atypical squamous cells of undetermined significance on cytologic smear of cervix (ASC-US): Secondary | ICD-10-CM

## 2023-04-07 DIAGNOSIS — R87810 Cervical high risk human papillomavirus (HPV) DNA test positive: Secondary | ICD-10-CM | POA: Diagnosis not present

## 2023-04-07 DIAGNOSIS — F4325 Adjustment disorder with mixed disturbance of emotions and conduct: Secondary | ICD-10-CM | POA: Diagnosis not present

## 2023-04-07 DIAGNOSIS — Z113 Encounter for screening for infections with a predominantly sexual mode of transmission: Secondary | ICD-10-CM | POA: Diagnosis not present

## 2023-04-07 LAB — POCT WET PREP (WET MOUNT)
Clue Cells Wet Prep Whiff POC: NEGATIVE
Trichomonas Wet Prep HPF POC: ABSENT
WBC, Wet Prep HPF POC: NONE SEEN

## 2023-04-07 MED ORDER — BREXPIPRAZOLE 0.25 MG PO TABS: 0.5000 mg | ORAL_TABLET | Freq: Every day | ORAL | 0 refills | Status: DC

## 2023-04-07 NOTE — Progress Notes (Unsigned)
    SUBJECTIVE:   CHIEF COMPLAINT / HPI:   Mood Disorder Started on abilify for impulsivity, hypersexuality, disorganized thought process in response to multiple life stressors with her mother's recent hospitalization and father transitioning to hospice care.   The clinical profile of brexpiprazole shows lower intrinsic activity on the dopamine agonist spectrum compared to aripiprazole, which results in fewer effects such as activation, agitation and akathisia, enabling a better overall tolerability Eppie Gibson 2016)  PERTINENT  PMH / PSH: ***  OBJECTIVE:   There were no vitals taken for this visit.  ***  ASSESSMENT/PLAN:   No problem-specific Assessment & Plan notes found for this encounter.     Eliezer Mccoy, MD [facility]

## 2023-04-07 NOTE — Patient Instructions (Signed)
Ms. Pires,   It is always great to see you!  I am glad to hear that you are doing better.  I am sorry about the weight gain with the Abilify.  Lets change course and try a new medicine called Rexulti.  This is a similar medication to the Abilify but with a better side effect profile.  If it is unaffordable, please give me a call and let me know.  If you have issues with your hemorrhoids, Preparation H over-the-counter is a great place to start.  We have scheduled you to come back on Thursday for your colposcopy.  I am going to have you come back to see me in 1 month to check in on how you are doing with the Rexulti. Eliezer Mccoy, MD

## 2023-04-08 LAB — RPR: RPR Ser Ql: NONREACTIVE

## 2023-04-08 LAB — CERVICOVAGINAL ANCILLARY ONLY
Chlamydia: NEGATIVE
Comment: NEGATIVE
Comment: NEGATIVE
Comment: NORMAL
Neisseria Gonorrhea: NEGATIVE
Trichomonas: NEGATIVE

## 2023-04-08 LAB — HCV INTERPRETATION

## 2023-04-08 LAB — HIV ANTIBODY (ROUTINE TESTING W REFLEX): HIV Screen 4th Generation wRfx: NONREACTIVE

## 2023-04-08 LAB — HCV AB W REFLEX TO QUANT PCR: HCV Ab: NONREACTIVE

## 2023-04-09 NOTE — Assessment & Plan Note (Signed)
Unremarkable pelvic exam today. -GC/chlamydia, trichomonas swab sent -Wet prep -HIV, RPR, hepatitis C

## 2023-04-09 NOTE — Assessment & Plan Note (Signed)
Excellent symptomatic control with Abilify. She has self-discontinued her Celexa. However, running into side effects with the Abilify. ?Akathesia as described by her "jitteriness" and weight gain. Would favor transition to brexipiprazole if we can get it covered by her insurance. "The clinical profile of brexpiprazole shows lower intrinsic activity on the dopamine agonist spectrum compared to aripiprazole, which results in fewer effects such as activation, agitation and akathisia, enabling a better overall tolerability" Eppie Gibson 2016). If brexipiprazole is not covered by insurance and is prohibitively expensive, would favor dose reduction of Abilify to 2.5mg  daily.

## 2023-04-09 NOTE — Assessment & Plan Note (Signed)
Scheduled for colposcopy this Thursday.

## 2023-04-10 ENCOUNTER — Telehealth: Payer: Self-pay

## 2023-04-10 ENCOUNTER — Ambulatory Visit: Payer: Medicaid Other

## 2023-04-10 NOTE — Telephone Encounter (Signed)
A Prior Authorization was initiated for this patients REXULTI through CoverMyMeds.   Key: [id number]

## 2023-04-11 NOTE — Telephone Encounter (Signed)
Prior Auth for patients medication REXULTI approved by [facility] MEDICAID from 04/10/23 to 04/09/24.  CoverMyMeds Key: [id number]

## 2023-04-24 ENCOUNTER — Ambulatory Visit (INDEPENDENT_AMBULATORY_CARE_PROVIDER_SITE_OTHER): Payer: Medicaid Other | Admitting: Student

## 2023-04-24 ENCOUNTER — Encounter: Payer: Self-pay | Admitting: Student

## 2023-04-24 VITALS — BP 136/90 | HR 103 | Ht 64.0 in | Wt 225.0 lb

## 2023-04-24 DIAGNOSIS — F4325 Adjustment disorder with mixed disturbance of emotions and conduct: Secondary | ICD-10-CM

## 2023-04-24 DIAGNOSIS — I10 Essential (primary) hypertension: Secondary | ICD-10-CM

## 2023-04-24 DIAGNOSIS — F411 Generalized anxiety disorder: Secondary | ICD-10-CM

## 2023-04-24 MED ORDER — ARIPIPRAZOLE 5 MG PO TABS: 5.0000 mg | ORAL_TABLET | Freq: Every day | ORAL | 3 refills | Status: DC

## 2023-04-24 MED ORDER — BUSPIRONE HCL 10 MG PO TABS: 10.0000 mg | ORAL_TABLET | Freq: Three times a day (TID) | ORAL | 1 refills | Status: DC

## 2023-04-24 MED ORDER — CITALOPRAM HYDROBROMIDE 20 MG PO TABS: 20.0000 mg | ORAL_TABLET | Freq: Every day | ORAL | 1 refills | Status: DC

## 2023-04-25 NOTE — Assessment & Plan Note (Addendum)
Patient requesting to go back onto Abilify.  I think this is a reasonable change especially since she feels well on the combination of Abilify, BuSpar, Celexa. -DC brexpiprazole -Aripiprazole 5 mg daily -Buspirone 10 mg 3 times daily -Citalopram 20 mg daily

## 2023-04-25 NOTE — Assessment & Plan Note (Signed)
Borderline BP in office today.  She tells me that she has been taking her amlodipine irregularly, did take it this morning. --Would expect best effect from her amlodipine with daily use, encourage regular adherence

## 2023-04-25 NOTE — Progress Notes (Signed)
    SUBJECTIVE:   CHIEF COMPLAINT / HPI:   Flu Like Symptoms Had flulike symptoms with bodyaches for several days.  These resolved after taking Epsom salt bath and drinking "a double shot of Hennessy".  Now feeling well.  No further concerns.   Mood Patient with longstanding, fairly significant anxiety and depression also with some hypersexuality, impulsivity, disorganization in the setting of recent illnesses in her mother and father for which she was started on Abilify.  At our last visit we had attempted to transition her from Abilify to Rexulti due to a little bit of weight gain that she had with the Abilify.  She returns today requesting to go back onto the Abilify as she "feels really good" on the combination of Abilify, Celexa, and BuSpar.   OBJECTIVE:   BP (!) 136/90   Pulse (!) 103   Ht 5\' 4"  (1.626 m)   Wt 225 lb (102.1 kg)   LMP 04/07/2023   SpO2 97%   BMI 38.62 kg/m   Gen: In good spirits, becomes intermittently tearful talking about recent emotional experiences Cardio: RRR, no m/r/g Pulm: Normal WOB on RA, lungs clear in all fields Psych: Mood and affect are appropriate to circumstances today. Less tangential/disorganized compared to previous exams  ASSESSMENT/PLAN:   Adjustment disorder with mixed disturbance of emotions and conduct Patient requesting to go back onto Abilify.  I think this is a reasonable change especially since she feels well on the combination of Abilify, BuSpar, Celexa. -DC brexpiprazole -Aripiprazole 5 mg daily -Buspirone 10 mg 3 times daily -Citalopram 20 mg daily  Essential hypertension, benign Borderline BP in office today.  She tells me that she has been taking her amlodipine irregularly, did take it this morning. --Would expect best effect from her amlodipine with daily use, encourage regular adherence     J Dorothyann Gibbs, MD [facility]

## 2023-05-01 ENCOUNTER — Other Ambulatory Visit (HOSPITAL_COMMUNITY): Payer: Self-pay

## 2023-05-09 ENCOUNTER — Ambulatory Visit: Payer: Self-pay | Admitting: Student

## 2023-06-26 ENCOUNTER — Ambulatory Visit: Payer: Medicaid Other | Admitting: Student

## 2023-06-26 NOTE — Progress Notes (Deleted)
    SUBJECTIVE:   CHIEF COMPLAINT / HPI:   Birth Control Presently on ***.   PERTINENT  PMH / PSH: ***  OBJECTIVE:   There were no vitals taken for this visit.  ***  ASSESSMENT/PLAN:   No problem-specific Assessment & Plan notes found for this encounter.     Eliezer Mccoy, MD [facility]

## 2023-07-14 ENCOUNTER — Telehealth: Payer: Self-pay | Admitting: Student

## 2023-07-14 ENCOUNTER — Ambulatory Visit (INDEPENDENT_AMBULATORY_CARE_PROVIDER_SITE_OTHER): Payer: Medicaid Other | Admitting: Student

## 2023-07-14 ENCOUNTER — Other Ambulatory Visit (HOSPITAL_COMMUNITY)
Admission: RE | Admit: 2023-07-14 | Discharge: 2023-07-14 | Disposition: A | Discharge status: Home / Self Care | Payer: Medicaid Other | Source: Ambulatory Visit | Attending: Family Medicine | Admitting: Family Medicine

## 2023-07-14 ENCOUNTER — Encounter: Payer: Self-pay | Admitting: Student

## 2023-07-14 VITALS — BP 136/80 | HR 84 | Wt 235.0 lb

## 2023-07-14 DIAGNOSIS — Z3202 Encounter for pregnancy test, result negative: Secondary | ICD-10-CM | POA: Diagnosis not present

## 2023-07-14 DIAGNOSIS — F528 Other sexual dysfunction not due to a substance or known physiological condition: Secondary | ICD-10-CM | POA: Diagnosis not present

## 2023-07-14 DIAGNOSIS — J029 Acute pharyngitis, unspecified: Secondary | ICD-10-CM | POA: Insufficient documentation

## 2023-07-14 DIAGNOSIS — Z2981 Encounter for HIV pre-exposure prophylaxis: Secondary | ICD-10-CM

## 2023-07-14 DIAGNOSIS — Z113 Encounter for screening for infections with a predominantly sexual mode of transmission: Secondary | ICD-10-CM | POA: Diagnosis present

## 2023-07-14 DIAGNOSIS — Z7251 High risk heterosexual behavior: Secondary | ICD-10-CM | POA: Diagnosis not present

## 2023-07-14 DIAGNOSIS — R635 Abnormal weight gain: Secondary | ICD-10-CM | POA: Diagnosis not present

## 2023-07-14 DIAGNOSIS — Z1152 Encounter for screening for COVID-19: Secondary | ICD-10-CM

## 2023-07-14 DIAGNOSIS — R87810 Cervical high risk human papillomavirus (HPV) DNA test positive: Secondary | ICD-10-CM

## 2023-07-14 DIAGNOSIS — F4325 Adjustment disorder with mixed disturbance of emotions and conduct: Secondary | ICD-10-CM

## 2023-07-14 DIAGNOSIS — T50905A Adverse effect of unspecified drugs, medicaments and biological substances, initial encounter: Secondary | ICD-10-CM

## 2023-07-14 DIAGNOSIS — R87610 Atypical squamous cells of undetermined significance on cytologic smear of cervix (ASC-US): Secondary | ICD-10-CM

## 2023-07-14 DIAGNOSIS — N760 Acute vaginitis: Secondary | ICD-10-CM

## 2023-07-14 HISTORY — DX: Acute pharyngitis, unspecified: J02.9

## 2023-07-14 LAB — POCT URINE PREGNANCY: Preg Test, Ur: NEGATIVE

## 2023-07-14 LAB — POC SOFIA SARS ANTIGEN FIA: SARS Coronavirus 2 Ag: NEGATIVE

## 2023-07-14 MED ORDER — REXULTI 0.5 MG PO TABS
1.0000 | ORAL_TABLET | Freq: Every day | ORAL | 1 refills | Status: DC
Start: 2023-07-14 — End: 2023-11-16

## 2023-07-14 NOTE — Assessment & Plan Note (Addendum)
Recurrent issue when she stops her meds. Weight gain on Abilify is a legitimate concern. Urine pregnancy negative today.  - Will trial Rexulti 0.5mg  daily in place of her Abilify - Continue BuSpar 10mg  TID - Continue Celexa 20mg  daily - Return next week for Nexplanon placement - Wants to start PrEP, labs collected today, if everything looks okay, will send Truvada to her pharmacy

## 2023-07-14 NOTE — Assessment & Plan Note (Signed)
Has no-showed colpo clinic repeatedly. Ongoing discussions regarding the importance of this. Re-visit at follow-up.

## 2023-07-14 NOTE — Telephone Encounter (Signed)
Patient called office requesting a call ahead of our visit this afternoon. She was panicked when calling the front desk so I was asked to give her a call back. She calls to tell me that she would like STI testing due to recent sexual encounters. Would also like her throat swabbed for GC/Chlamydia. - Will see this afternoon for STI testing, will plan to swab her vagina and throat - Will also need Upreg - Will discuss PrEP today as well  J Dorothyann Gibbs, MD

## 2023-07-14 NOTE — Patient Instructions (Addendum)
I really think you'd do well to re-start your Celexa and  Buspar. Given the weight gain, lets go back to Rexulti instead of Abilify. It tends to have fewer side effects. I think this will keep away the irresponsible sexual encounters. I'm going to start you on PrEP, medication to help prevent HIV infection. This could be a chance to avoid a lifechanging diagnosis. We're checking some labs today and if everything looks okay, I will send this into your pharmacy.   Now, I REALLY need you to think about an IUD or Nexplanon. I dont think a pregnancy would be good for your mental health right now.    You can find a therapist on [email or web address] and searching by your insurance.   Eliezer Mccoy, MD

## 2023-07-14 NOTE — Assessment & Plan Note (Signed)
Plans to return next week to address weight directly. BMI is now >40. Will engage on lifestyle interventions at follow-up. Could consider GLP-1a. Also, given that weight gain is thought at least partly 2/2 antipsychotic use, could consider a trial of metformin.  - Return in 1 week

## 2023-07-14 NOTE — Assessment & Plan Note (Addendum)
Most likely viral in etiology given concomitant cough. COVID reassuringly negative.  - GC/Chlamydia swab due to exposure  - Centor Score of just 1 for exudate, deferred strep swab - Anticipate self-resolving course

## 2023-07-14 NOTE — Progress Notes (Signed)
SUBJECTIVE:   CHIEF COMPLAINT / HPI:   Hypersexuality  Recently stopped taking her Abilify.  Has been hypersexual since stopping.  Recently had multiple unprotected sexual encounters with three different men including oral sex, requests STD testing today. She feels that these sexual encounters have been "irresponsible." "I hate when I get this way." Notes that she had been doing really well on her cocktail of Buspar, Celexa, and Abilify but stopped taking them due to weight gain.  This is a recurrent issue when she stops her medications and becomes hypersexual. She is not currently using anything for contraception. We have talked about this extensively. She tells me today that she is going to restart her medicines and "be abstinent." She and I both know this is improbable given her history. She now tells me that she is willing to have a Nexplanon placed at follow-up next week. She would also like to start PrEP for HIV Prevention.    Sore Throat Since Saturday. Wondering if this could be an STD vs viral illness. Also has felt feverish and had a cough. Tells me this feels like COVID, but also that she ate some graham crackers that were "really dry" and wonders if that might be the cause. No one around her has been sick.   Weight Gain Up 10 lbs since May which she attributes to her Abilify. Discussed time limitations of today's visit and that this deserves its own visit. We will DC the Abilify and    OBJECTIVE:   BP 136/80   Pulse 84   Wt 235 lb (106.6 kg)   LMP 05/24/2023   SpO2 98%   BMI 40.34 kg/m   Physical Exam Vitals reviewed. Exam conducted with a chaperone present.  Constitutional:      General: She is not in acute distress. HENT:     Mouth/Throat:     Mouth: Mucous membranes are moist.  Pulmonary:     Effort: No respiratory distress.  Genitourinary:    General: Normal vulva.     Vagina: Normal. No vaginal discharge.     Cervix: Normal. No discharge or lesion.   Musculoskeletal:        General: No swelling or deformity.  Skin:    General: Skin is warm and dry.     Findings: No rash.  Neurological:     General: No focal deficit present.  Psychiatric:     Comments: No grandiosity, tangentiality, or pressured speech today. Mood and affect are quite appropriate.       ASSESSMENT/PLAN:   Hypersexuality Recurrent issue when she stops her meds. Weight gain on Abilify is a legitimate concern. Urine pregnancy negative today.  - Will trial Rexulti 0.5mg  daily in place of her Abilify - Continue BuSpar 10mg  TID - Continue Celexa 20mg  daily - Return next week for Nexplanon placement - Wants to start PrEP, labs collected today, if everything looks okay, will send Truvada to her pharmacy   Sore throat Most likely viral in etiology given concomitant cough. COVID reassuringly negative.  - GC/Chlamydia swab due to exposure  - Centor Score of just 1 for exudate, deferred strep swab - Anticipate self-resolving course   Weight gain due to medication Plans to return next week to address weight directly. BMI is now >40. Will engage on lifestyle interventions at follow-up. Could consider GLP-1a. Also, given that weight gain is thought at least partly 2/2 antipsychotic use, could consider a trial of metformin.  - Return in 1 week  ASCUS with positive high risk HPV cervical Has no-showed colpo clinic repeatedly. Ongoing discussions regarding the importance of this. Re-visit at follow-up.      Eliezer Mccoy, MD [facility]

## 2023-07-17 ENCOUNTER — Telehealth: Payer: Self-pay

## 2023-07-17 ENCOUNTER — Other Ambulatory Visit (HOSPITAL_COMMUNITY): Payer: Self-pay

## 2023-07-17 MED ORDER — EMTRICITABINE-TENOFOVIR DF 200-300 MG PO TABS
1.0000 | ORAL_TABLET | Freq: Every day | ORAL | 3 refills | Status: DC
Start: 2023-07-17 — End: 2023-11-27

## 2023-07-17 MED ORDER — METRONIDAZOLE 500 MG PO TABS
500.0000 mg | ORAL_TABLET | Freq: Two times a day (BID) | ORAL | 0 refills | Status: DC
Start: 2023-07-17 — End: 2023-11-16

## 2023-07-17 NOTE — Telephone Encounter (Signed)
Pharmacy Patient Advocate Encounter   Received notification from CoverMyMeds that prior authorization for REXULTI is required/requested.   Insurance verification completed.   The patient is insured through [facility] .   Per test claim: PA required; PA submitted to [facility] via CoverMyMeds Key/confirmation #/EOC [id number]. Status is pending

## 2023-07-17 NOTE — Addendum Note (Signed)
Addended by: Darnelle Spangle B on: 07/17/2023 12:00 PM   Modules accepted: Orders

## 2023-07-17 NOTE — Telephone Encounter (Signed)
Pharmacy Patient Advocate Encounter  Received notification from [facility] that Prior Authorization for REXULTI has been APPROVED from 07/17/23 to 07/16/24

## 2023-07-23 ENCOUNTER — Ambulatory Visit: Payer: Self-pay | Admitting: Student

## 2023-07-23 NOTE — Progress Notes (Deleted)
    SUBJECTIVE:   CHIEF COMPLAINT / HPI:   Hypersexuality Desires Nexplanon.   PERTINENT  PMH / PSH: ***  OBJECTIVE:   LMP 05/24/2023   ***  ASSESSMENT/PLAN:   No problem-specific Assessment & Plan notes found for this encounter.     Eliezer Mccoy, MD [facility]

## 2023-08-08 ENCOUNTER — Other Ambulatory Visit (HOSPITAL_COMMUNITY): Payer: Self-pay

## 2023-08-27 ENCOUNTER — Ambulatory Visit: Payer: Medicaid Other | Admitting: Family Medicine

## 2023-10-27 ENCOUNTER — Other Ambulatory Visit: Payer: Self-pay | Admitting: Student

## 2023-10-27 DIAGNOSIS — I10 Essential (primary) hypertension: Secondary | ICD-10-CM

## 2023-11-16 ENCOUNTER — Telehealth: Payer: Self-pay | Admitting: Student

## 2023-11-16 ENCOUNTER — Encounter: Payer: Self-pay | Admitting: Student

## 2023-11-16 DIAGNOSIS — F411 Generalized anxiety disorder: Secondary | ICD-10-CM

## 2023-11-16 DIAGNOSIS — F4325 Adjustment disorder with mixed disturbance of emotions and conduct: Secondary | ICD-10-CM

## 2023-11-16 MED ORDER — CITALOPRAM HYDROBROMIDE 20 MG PO TABS
20.0000 mg | ORAL_TABLET | Freq: Every day | ORAL | 1 refills | Status: DC
Start: 2023-11-16 — End: 2024-03-09

## 2023-11-16 MED ORDER — REXULTI 0.5 MG PO TABS
1.0000 | ORAL_TABLET | Freq: Every day | ORAL | 1 refills | Status: DC
Start: 2023-11-16 — End: 2024-03-09

## 2023-11-16 MED ORDER — BUSPIRONE HCL 10 MG PO TABS
10.0000 mg | ORAL_TABLET | Freq: Three times a day (TID) | ORAL | 1 refills | Status: DC
Start: 2023-11-16 — End: 2024-03-09

## 2023-11-16 NOTE — Telephone Encounter (Signed)
Received page on after-hours emergency line.  Called patient back and she notes that she is having a difficult time with mental health, her period, recent death of her father and desperately needed to talk with Dr. Marisue Humble.  She recently discontinued all of her medications especially for mental health.  Patient was not agitated or pressured in speech during the phone call.  Denies that she is currently in a mental health crisis.  She is in a safe place.  Denies SI/HI.  I offered to get in touch with Dr. Marisue Humble.  Unfortunately, unable to make an appointment as his schedule is booked until late December.  However, she was agreeable to access to care appointment which was scheduled at 1:50 PM on 11/17/2023 for purposes of mental health and birth control.  Additionally, I will attempt to get in touch with Dr. Marisue Humble per her request.  She was thankful of phone call.

## 2023-11-16 NOTE — Telephone Encounter (Addendum)
Called patient to discuss. 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 Also recent death of her father. Tells me that she's been doing really well up until these two new stressors. She recently stopped all of her medications and feels like she needs to restart her psychotropic medications and wants to restart ASAP.  She had spoken with Dr. Royal Piedra earlier in the evening and he had made her an appointment for Tomorrow afternoon on ATC. She expresses a strong desire to see me rather than another ATC provider even if it means waiting a few days. Will move to my ATC slot later in the week.  She also expresses interest in Nexplanon placement. We can likely due this on Friday.  She contracts for safety. No SI, does have fleeting thoughts of violence toward her supervisor at work but does not believe she will act on these. Is hoping that restarting her meds will be helpful here. Asking for a work note to remain out to give her time to restart meds.  - Refilled her Rexulti, Celexa, and Buspar  - Will try to place Nexplanon on Friday  - Will need to repeat Truvada labs on Friday prior to restarting. Will get these on Friday.   Eliezer Mccoy, MD

## 2023-11-17 ENCOUNTER — Ambulatory Visit: Payer: Self-pay

## 2023-11-17 NOTE — Progress Notes (Unsigned)
  SUBJECTIVE:   CHIEF COMPLAINT / HPI:   Stage IA (pT1b, pN0, cM0, G1, ER+, PR+, HER2-) breast cancer: Bilateral mastectomy with left axillary sentinel lymph node biopsy. Follows with Dr. Feng with oncology; at 09/07/2021 visit was doing well. Currently on Tamoxifen (plan for 10 years)  Anxiety and Depression: On Effexor 75mg, Abilify, Klonopin, Adderall and being seen by Psychiatrist, Lisa Poulos and counselor. Current PHQ9 16. Improved depression and anxiety, per patient. Difficulty with concentration, does feel that her Adderall is beneficial to her fatigue. She sees her psychiatrist every 3 months.      05/08/2022    1:51 PM 07/18/2021    2:42 PM 05/10/2021    9:43 AM  PHQ9 SCORE ONLY  PHQ-9 Total Score 16 13 9   HLD: Last lipid panel 1 year ago.   Healthcare Maintenance: In need of Hep C screening, Pap smear   Voiding Issues: Cannot hold urine; has an accident once every two weeks in public. No pain with urination or hematuria. She does feel as if she goes often. Patient has not had a period in 6 months. She has been having hot flashes as well, relates this to menopausal symptoms. She also has dribbling of urine with sneezing and laughing. She has two children at home, same partner for 15 years; no concerns for STIs.   PERTINENT  PMH / PSH:  Patient Active Problem List   Diagnosis Date Noted   Overactive bladder 05/08/2022   Lumbar pain 07/18/2021   Nodule of skin of head 07/18/2021   Hirsutism 05/11/2021   Irregular menses 02/17/2021   S/P mastectomy, bilateral 07/06/2020   High thyroid stimulating hormone (TSH) level 04/12/2020   Family history of uterine cancer    Family history of breast cancer    Family history of skin cancer    Malignant neoplasm of upper-inner quadrant of left breast in female, estrogen receptor positive (HCC) 09/24/2019   Mixed hyperlipidemia 07/09/2019   Vitamin D deficiency 02/18/2018   Depression 03/02/2017   Carpal tunnel syndrome 03/02/2017    Obesity 03/02/2017   Memory loss 05/30/2016   Chronic migraine 03/12/2016     OBJECTIVE:   BP 132/81   Pulse 85   Ht 5' 1" (1.549 m)   Wt 179 lb 8 oz (81.4 kg)   LMP 10/09/2021   SpO2 98%   BMI 33.92 kg/m   General: Alert and oriented in no apparent distress, pleasant F  Heart: Regular rate and rhythm with no murmurs appreciated Lungs: CTA bilaterally, no wheezing Abdomen: Bowel sounds present, no abdominal pain, non-distended and soft  Skin: Warm and dry Extremities: No lower extremity edema, DP pulses palpated    ASSESSMENT/PLAN:   Malignant neoplasm of upper-inner quadrant of left breast in female, estrogen receptor positive (HCC)  Currently on Tamoxifen (plan for 10 years)  Depression On Effexor 75mg, Abilify, Klonopin, Adderall and being seen by Psychiatry, Lisa Poulos and counselor.  Although patient has an elevated PHQ-9, she expresses that her depression/anxiety/ADHD has greatly improved.  At the request of her psychiatrist, we will obtain lab work, consists of thyroid studies, vitamin D, hemoglobin A1c, LDL with lipid panel, CBC, CMP.  Patient has recently received CBC and CMP, will forward results to psychiatry at request of patient with fax number for [facility] [phone].   Overactive bladder Overactive bladder with complex history (breast cancer on tamoxifen). Will continue with UA to evaluate for possible UTI.  No concern for STIs as patient has been monogamous   with 1 partner for over 15 years.  Could possibly consider Myrbetriq, but the patient is on tamoxifen and it has a drug to drug interaction with Myrbetriq.  We will also continue with conservative treatment via bladder training techniques that were discussed with patient in room.  We will have her return if the symptoms persist after amount with bladder training exercises.  We also discussed options of pelvic floor training as well as pelvic floor centered physical therapy.  Mixed  hyperlipidemia Patient is currently taking Crestor, continue with updated lipid panel.  Lumbar pain Greatly improved with physical therapy, completed all physical therapy courses and still has relief of back pain.  High thyroid stimulating hormone (TSH) level Continuing with thyroid studies per request of psychiatrist.  Previous thyroid studies obtained 02/2021 were normal.  Vitamin D deficiency Updated vitamin D obtained at request of psychiatry.   Patient declined pap smear at this time, instructed that she can schedule this with us. Hep C obtained with other labwork. Patient reports that she had a colonoscopy 8 years ago that was nml. No records of this.   Lamonta Cypress, MD [facility]   

## 2023-11-17 NOTE — Patient Instructions (Incomplete)
It was great to see you today! Thank you for choosing [facility] for your primary care.  Today we addressed: ***  If you haven't already, sign up for My Chart to have easy access to your labs results, and communication with your primary care physician. {AVS options:28020} No follow-ups on file. Please arrive 15 minutes before your appointment to ensure smooth check in process.  We appreciate your efforts in making this happen.  Thank you for allowing me to participate in your care, Alfredo Martinez, MD 11/17/2023, 12:22 PM PGY-***, [facility]

## 2023-11-21 ENCOUNTER — Ambulatory Visit: Payer: Self-pay

## 2023-11-21 NOTE — Patient Instructions (Incomplete)
I've re-scheduled your colposcopy for next Thursday, Dec 19th at 11am.

## 2023-11-24 ENCOUNTER — Telehealth: Payer: Self-pay | Admitting: Student

## 2023-11-24 NOTE — Telephone Encounter (Signed)
Patient requesting Dr. Marisue Humble to give her a call. Patient would not say what for.

## 2023-11-24 NOTE — Telephone Encounter (Signed)
Called patient back she still did not want to explain what the call is regarding, Please call patient at your convenience. Penni Bombard CMA

## 2023-11-25 ENCOUNTER — Ambulatory Visit: Payer: Self-pay

## 2023-11-25 VITALS — BP 128/88 | HR 83 | Ht 64.0 in | Wt 217.4 lb

## 2023-11-25 DIAGNOSIS — Z30017 Encounter for initial prescription of implantable subdermal contraceptive: Secondary | ICD-10-CM | POA: Diagnosis not present

## 2023-11-25 DIAGNOSIS — F4323 Adjustment disorder with mixed anxiety and depressed mood: Secondary | ICD-10-CM | POA: Diagnosis not present

## 2023-11-25 DIAGNOSIS — Z7251 High risk heterosexual behavior: Secondary | ICD-10-CM

## 2023-11-25 DIAGNOSIS — Z2981 Encounter for HIV pre-exposure prophylaxis: Secondary | ICD-10-CM | POA: Diagnosis not present

## 2023-11-25 DIAGNOSIS — Z975 Presence of (intrauterine) contraceptive device: Secondary | ICD-10-CM | POA: Diagnosis not present

## 2023-11-25 DIAGNOSIS — R87810 Cervical high risk human papillomavirus (HPV) DNA test positive: Secondary | ICD-10-CM | POA: Diagnosis not present

## 2023-11-25 DIAGNOSIS — F528 Other sexual dysfunction not due to a substance or known physiological condition: Secondary | ICD-10-CM

## 2023-11-25 DIAGNOSIS — Z3046 Encounter for surveillance of implantable subdermal contraceptive: Secondary | ICD-10-CM

## 2023-11-25 DIAGNOSIS — R87610 Atypical squamous cells of undetermined significance on cytologic smear of cervix (ASC-US): Secondary | ICD-10-CM

## 2023-11-25 LAB — POCT URINE PREGNANCY: Preg Test, Ur: NEGATIVE

## 2023-11-25 NOTE — Patient Instructions (Addendum)
Lisa Crosby,  Always great to see you! Today we placed a Nexplanon and we are repeating your labs for your PrEP. I will call you if we are restarting this.   We will see you Thursday Morning at 11am for a colposcopy based on your last Pap results.   Eliezer Mccoy, MD

## 2023-11-26 DIAGNOSIS — F4323 Adjustment disorder with mixed anxiety and depressed mood: Secondary | ICD-10-CM | POA: Insufficient documentation

## 2023-11-26 LAB — BASIC METABOLIC PANEL
BUN/Creatinine Ratio: 11 (ref 9–23)
BUN: 9 mg/dL (ref 6–24)
CO2: 22 mmol/L (ref 20–29)
Calcium: 9 mg/dL (ref 8.7–10.2)
Chloride: 105 mmol/L (ref 96–106)
Creatinine, Ser: 0.82 mg/dL (ref 0.57–1.00)
Glucose: 99 mg/dL (ref 70–99)
Potassium: 3.8 mmol/L (ref 3.5–5.2)
Sodium: 141 mmol/L (ref 134–144)
eGFR: 93 mL/min/{1.73_m2} (ref >59)

## 2023-11-26 LAB — RPR: RPR Ser Ql: NONREACTIVE

## 2023-11-26 LAB — LIPID PANEL
Chol/HDL Ratio: 4.2 {ratio} (ref 0.0–4.4)
Cholesterol, Total: 146 mg/dL (ref 100–199)
HDL: 35 mg/dL — ABNORMAL LOW (ref >39)
LDL Chol Calc (NIH): 81 mg/dL (ref 0–99)
Triglycerides: 172 mg/dL — ABNORMAL HIGH (ref 0–149)
VLDL Cholesterol Cal: 30 mg/dL (ref 5–40)

## 2023-11-26 LAB — HEPATITIS B SURFACE ANTIGEN: Hepatitis B Surface Ag: NEGATIVE

## 2023-11-26 LAB — HCV INTERPRETATION

## 2023-11-26 LAB — HCV AB W REFLEX TO QUANT PCR: HCV Ab: NONREACTIVE

## 2023-11-26 LAB — HEPATITIS B SURFACE ANTIBODY,QUALITATIVE: Hep B Surface Ab, Qual: NONREACTIVE

## 2023-11-26 LAB — HEPATITIS B CORE AB W/REFLEX: Hep B Core Total Ab: NEGATIVE

## 2023-11-26 LAB — HIV ANTIBODY (ROUTINE TESTING W REFLEX): HIV Screen 4th Generation wRfx: NONREACTIVE

## 2023-11-26 NOTE — Assessment & Plan Note (Signed)
Not pregnant today, first day of last menstrual period was <7 days ago. Declines pelvic exam/STI swabs. Open to Nexplanon for birth control and re-starting PrEP.  - Nexplanon placed today, see procedure note below - PrEP labs obtained today, if appropriate will restart Truvada  - Restarting psychotropic meds as above

## 2023-11-26 NOTE — Assessment & Plan Note (Signed)
Nexplanon Insertion Patient identified, informed consent performed, consent signed.   Patient does understand that irregular bleeding is a very common side effect of this medication. She was advised to have backup contraception for one week after placement. Pregnancy test in clinic today was negative.  Appropriate time out taken. The insertion area was measured and marked.  Patient's left arm was prepped with alcohol swab and then injected with 5 ml of 1% lidocaine.  The area was then prepped with betadine, Nexplanon removed from packaging,  device confirmed present within needle, then inserted full length of needle and withdrawn per handbook instructions. Nexplanon was able to palpated in the patient's arm; patient palpated the insert herself. There was minimal blood loss.  Patient insertion site covered with guaze and a pressure bandage to reduce any bruising.  The patient tolerated the procedure well and was given post procedure instructions.

## 2023-11-26 NOTE — Assessment & Plan Note (Addendum)
Depressed episode in the setting of recent breakup. Also more hypersexual since stopping her meds. Amenable to restarting, tells me she feels much better when taking her meds. Hoping getting back on the meds will also help with her short temper with her boss. After clarification, I do believe her when she tells me she was joking about killing her boss. No real HI and never any SI. Points to her kids as protective factors. She contracts for safety for both herself and those around her.  - Restart Rexulti 0.5mg  daily (ordered last week) - Restart Buspar 10mg  TID (ordered last week) - Restart citalopram 20mg  daily (ordered last week)

## 2023-11-26 NOTE — Assessment & Plan Note (Signed)
Has no-showed colpo clinic x2. Discussed the utmost importance of having a colposcopy. She affirms to me that she will come to her scheduled colposcopy on Thursday 12/19 at 11am.
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
## 2023-11-27 ENCOUNTER — Other Ambulatory Visit: Payer: Self-pay | Admitting: Student

## 2023-11-27 ENCOUNTER — Ambulatory Visit: Payer: Medicaid Other

## 2023-11-27 DIAGNOSIS — Z2981 Encounter for HIV pre-exposure prophylaxis: Secondary | ICD-10-CM

## 2023-11-27 MED ORDER — EMTRICITABINE-TENOFOVIR DF 200-300 MG PO TABS: 1.0000 | ORAL_TABLET | Freq: Every day | ORAL | 3 refills | Status: DC

## 2023-12-01 ENCOUNTER — Ambulatory Visit: Payer: Medicaid Other | Admitting: Student

## 2023-12-04 ENCOUNTER — Ambulatory Visit: Payer: Medicaid Other | Admitting: Student

## 2023-12-05 MED ORDER — ETONOGESTREL 68 MG ~~LOC~~ IMPL
68.0000 mg | DRUG_IMPLANT | Freq: Once | SUBCUTANEOUS | Status: AC
  Administered 2023-11-25: 68 mg via SUBCUTANEOUS

## 2023-12-05 NOTE — Addendum Note (Signed)
Addended by: Veronda Prude on: 12/05/2023 03:53 PM   Modules accepted: Orders

## 2023-12-08 ENCOUNTER — Telehealth: Payer: Self-pay | Admitting: Student

## 2023-12-08 NOTE — Telephone Encounter (Signed)
Patient called back and spoke with [facility]. See phone note from today, 12/30.  Veronda Prude, RN

## 2023-12-08 NOTE — Telephone Encounter (Signed)
Called patient at provided phone number.  Asked to speak with patient. Spoke with patient's best friend who said that patient would like to call me back in "about 10 minutes."   Will wait for returned call.   Veronda Prude, RN

## 2023-12-08 NOTE — Telephone Encounter (Signed)
Patient called rescheduled her appointment for Colpo clinic. She stated " Please tell him I swear on my kids and daddy I will not miss this appointment". She is also asking if doctor could call her, she is wanting a letter to put her out of work until after her colpo appointment which is 01/09. She is wanting a letter to write her out until 01/10. She said because with her mental status right now and her boss is threating to fire her if she doesn't bring in a note she said if she goes back right now she will go to jail because her boss is a "B word" and her boss is requesting a letter from the doctor for her to be out. She is requesting the letter be sent through Mychart and is needing it by the end of today so she doesn't get fired. She kept saying I really need that letter for my mental health purposes.   PLEASE ADVISE.  Thanks!

## 2023-12-09 ENCOUNTER — Encounter: Payer: Self-pay | Admitting: Student

## 2023-12-18 ENCOUNTER — Ambulatory Visit: Payer: Medicaid Other

## 2024-01-22 ENCOUNTER — Ambulatory Visit: Payer: Medicaid Other

## 2024-03-09 ENCOUNTER — Ambulatory Visit (HOSPITAL_COMMUNITY)
Admission: EM | Admit: 2024-03-09 | Discharge: 2024-03-09 | Disposition: A | Discharge status: Home / Self Care | Attending: Nurse Practitioner | Admitting: Nurse Practitioner

## 2024-03-09 ENCOUNTER — Ambulatory Visit (HOSPITAL_COMMUNITY)
Admission: EM | Admit: 2024-03-09 | Discharge: 2024-03-09 | Discharge status: Against Medical Advice | Source: Home / Self Care

## 2024-03-09 DIAGNOSIS — F39 Unspecified mood [affective] disorder: Secondary | ICD-10-CM | POA: Insufficient documentation

## 2024-03-09 DIAGNOSIS — Z5900 Homelessness unspecified: Secondary | ICD-10-CM | POA: Insufficient documentation

## 2024-03-09 DIAGNOSIS — F1410 Cocaine abuse, uncomplicated: Secondary | ICD-10-CM | POA: Insufficient documentation

## 2024-03-09 DIAGNOSIS — Z888 Allergy status to other drugs, medicaments and biological substances status: Secondary | ICD-10-CM | POA: Insufficient documentation

## 2024-03-09 DIAGNOSIS — F1520 Other stimulant dependence, uncomplicated: Secondary | ICD-10-CM

## 2024-03-09 DIAGNOSIS — F1524 Other stimulant dependence with stimulant-induced mood disorder: Secondary | ICD-10-CM | POA: Insufficient documentation

## 2024-03-09 DIAGNOSIS — F1994 Other psychoactive substance use, unspecified with psychoactive substance-induced mood disorder: Secondary | ICD-10-CM

## 2024-03-09 DIAGNOSIS — F1910 Other psychoactive substance abuse, uncomplicated: Secondary | ICD-10-CM

## 2024-03-09 DIAGNOSIS — F32A Depression, unspecified: Secondary | ICD-10-CM | POA: Insufficient documentation

## 2024-03-09 MED ORDER — DIPHENHYDRAMINE HCL 50 MG PO CAPS: 50.0000 mg | ORAL_CAPSULE | Freq: Three times a day (TID) | ORAL | Status: DC | PRN

## 2024-03-09 MED ORDER — AMLODIPINE BESYLATE 10 MG PO TABS: 10.0000 mg | ORAL_TABLET | Freq: Every day | ORAL | Status: DC

## 2024-03-09 MED ORDER — ONDANSETRON 4 MG PO TBDP: 4.0000 mg | ORAL_TABLET | Freq: Four times a day (QID) | ORAL | Status: DC | PRN

## 2024-03-09 MED ORDER — HYDROXYZINE HCL 25 MG PO TABS: 25.0000 mg | ORAL_TABLET | Freq: Four times a day (QID) | ORAL | Status: DC | PRN

## 2024-03-09 MED ORDER — BREXPIPRAZOLE 1 MG PO TABS: 0.5000 mg | ORAL_TABLET | Freq: Every day | ORAL | Status: DC

## 2024-03-09 MED ORDER — MAGNESIUM HYDROXIDE 400 MG/5ML PO SUSP: 30.0000 mL | Freq: Every day | ORAL | Status: DC | PRN

## 2024-03-09 MED ORDER — DIPHENHYDRAMINE HCL 50 MG/ML IJ SOLN: 50.0000 mg | Freq: Three times a day (TID) | INTRAMUSCULAR | Status: DC | PRN

## 2024-03-09 MED ORDER — NAPROXEN 500 MG PO TABS: 500.0000 mg | ORAL_TABLET | Freq: Two times a day (BID) | ORAL | Status: DC | PRN

## 2024-03-09 MED ORDER — ALUM & MAG HYDROXIDE-SIMETH 200-200-20 MG/5ML PO SUSP: 30.0000 mL | ORAL | Status: DC | PRN

## 2024-03-09 MED ORDER — LORAZEPAM 2 MG/ML IJ SOLN: 2.0000 mg | Freq: Three times a day (TID) | INTRAMUSCULAR | Status: DC | PRN

## 2024-03-09 MED ORDER — HALOPERIDOL 5 MG PO TABS: 5.0000 mg | ORAL_TABLET | Freq: Three times a day (TID) | ORAL | Status: DC | PRN

## 2024-03-09 MED ORDER — LOPERAMIDE HCL 2 MG PO CAPS: 2.0000 mg | ORAL_CAPSULE | ORAL | Status: DC | PRN

## 2024-03-09 MED ORDER — METHOCARBAMOL 500 MG PO TABS: 500.0000 mg | ORAL_TABLET | Freq: Three times a day (TID) | ORAL | Status: DC | PRN

## 2024-03-09 MED ORDER — HALOPERIDOL LACTATE 5 MG/ML IJ SOLN: 10.0000 mg | Freq: Three times a day (TID) | INTRAMUSCULAR | Status: DC | PRN

## 2024-03-09 MED ORDER — HALOPERIDOL LACTATE 5 MG/ML IJ SOLN: 5.0000 mg | Freq: Three times a day (TID) | INTRAMUSCULAR | Status: DC | PRN

## 2024-03-09 MED ORDER — ACETAMINOPHEN 325 MG PO TABS: 650.0000 mg | ORAL_TABLET | Freq: Four times a day (QID) | ORAL | Status: DC | PRN

## 2024-03-09 MED ORDER — DICYCLOMINE HCL 20 MG PO TABS: 20.0000 mg | ORAL_TABLET | Freq: Four times a day (QID) | ORAL | Status: DC | PRN
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
## 2024-03-09 NOTE — ED Notes (Signed)
 Patient BP on arrived was 155/115, HR 101.Upon manually rechecking her BP she states "I have my medicine in the car, I'll take it when I get in the car". Patient refused to have her BP rechecked and told me to tell "them" she is ready to go. [place] NP was notified and patient signed a AMA form and was walked to get her belonging from the locker with security.
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
## 2024-03-09 NOTE — Discharge Instructions (Signed)

## 2024-03-09 NOTE — ED Notes (Signed)
 Patient  signed AMA form . Patient verbalized understanding of risk involved in leaving the hospital without receiving further care, Encouragement and support provided and safety maintain. Belongings returned from orange locker and patient escorted to front lobby.

## 2024-03-09 NOTE — ED Provider Notes (Signed)
 [facility] Continuous Assessment Admission H&P  Date: 03/09/24 Patient Name: Lisa Crosby MRN: [id number] Chief Complaint: " I don't know if I'm crazy"  Diagnoses:  Final diagnoses:  Substance induced mood disorder (HCC)  Substance abuse (HCC)  Methamphetamine use disorder, severe, dependence (HCC)  Homelessness unspecified    HPI: Lisa Crosby is a 43 year old female with psychiatric history of depression, GAD, ADD, Methamphetamine abuse and persistent adjustment disorder with mixed anxiety and depressed mood, who presented voluntarily as a walk in to [facility], accompanied by her boyfriend, seeking help with methamphetamine abuse and depression.   Patient as seen face to face by this provider and chart reviewed with Dr Woodroe Mode.  On evaluation, patient is alert, oriented x3 , and cooperative. Speech is clear and coherent. Pt appears casually dressed. Eye contact is fair. Mood is anxious and depressed, affect is congruent with mood. Thought process is coherent and circumstantial, and thought content is filled with paranoid ideations and obsessions. Pt denies SI/HI/, endorses AVH but unable to describe specifically the alterations in reality she is experiencing. There is no objective indication that the patient is responding to internal stimuli. No delusions elicited during this assessment.  .  Patient reports losing her dad October 22nd last year and also meeting a man at that time who introduced her to drugs (methamphetamines) .   Patient reports "I started doing it, I've been doing it since October, and we were in a relationship, he moved into my house, but he started cheating on me and lying and tried to say I was crazy and my work habits changed and since then, it always seems like a real movie, but we broke up and he moved across my house to his grandma's house, but it didn't get better because I met another man at a friend's house, and I was also sleeping around with different men and I  have been staying at a motel and extended stay's for the past 3 weeks and I currently use meth once or twice daily, and I'm upset upset with my family for not helping me and with ex, but I don't have any plans on hurting anyone".  Pt reports history of anxiety and depression. Pt reports she is prescribed medications, however she is not compliant with medication regimen, and  unable to remember what medications she is prescribed.  She is not established with outpatient psychiatric or therapy.  Discussed recommendations for inpatient psychiatric admission for stabilization and treatment.  Discussed inpatient milieu and expectations.   Patient initially agreed to admission to the [facility]  continuous observation unit for safety monitoring pending transfer to an inpatient psychiatric unit, However she has changed her mind and is requesting to leave AMA, stating she will seek outpatient treatment at this time.  Pt is provided with verbal encouragement to stay and continue treatment.  Patient is provided education on the benefits of staying versus risk of leaving without treatment.  Patient acknowledges understanding of education provided but insists on leaving AMA.    Patient declined outpatient substance abuse resources. She also declined recheck of her Vital signs, stating she has BP pills in her car and will take them when she leaves the facility.   Total Time spent with patient: 30 minutes  Musculoskeletal  Strength & Muscle Tone: within normal limits Gait & Station: normal Patient leans: N/A  Psychiatric Specialty Exam  Presentation General Appearance:  Casual  Eye Contact: Fair  Speech: Clear and Coherent  Speech Volume:  Decreased  Handedness: Right   Mood and Affect  Mood: Anxious; Depressed  Affect: Congruent; Tearful   Thought Process  Thought Processes: Coherent  Descriptions of Associations:Circumstantial  Orientation:Full (Time, Place and Person)  Thought  Content:Obsessions; Paranoid Ideation  Diagnosis of Schizophrenia or Schizoaffective disorder in past: No   Hallucinations:Hallucinations: Auditory; Visual Description of Auditory Hallucinations: Pt reports hearing voices/sounds, unable to make out what they are saying Description of Visual Hallucinations: Pt says she see's stuff, unable to describe what she sees  Ideas of Reference:Paranoia  Suicidal Thoughts:Suicidal Thoughts: No  Homicidal Thoughts:Homicidal Thoughts: No   Sensorium  Memory: Immediate Fair  Judgment: Intact  Insight: Present   Executive Functions  Concentration: Fair  Attention Span: Fair  Recall: [employer] of Knowledge: Fair  Language: Fair   Psychomotor Activity  Psychomotor Activity: Psychomotor Activity: Normal   Assets  Assets: Communication Skills; Desire for Improvement   Sleep  Sleep: Sleep: Fair   Nutritional Assessment (For OBS and FBC admissions only) Has the patient had a weight loss or gain of 10 pounds or more in the last 3 months?: No Has the patient had a decrease in food intake/or appetite?: No Does the patient have dental problems?: No Does the patient have eating habits or behaviors that may be indicators of an eating disorder including binging or inducing vomiting?: No Has the patient recently lost weight without trying?: 0 Has the patient been eating poorly because of a decreased appetite?: 0 Malnutrition Screening Tool Score: 0    Physical Exam Constitutional:      General: She is not in acute distress.    Appearance: She is not diaphoretic.  HENT:     Right Ear: External ear normal.     Left Ear: External ear normal.     Nose: No congestion.  Pulmonary:     Effort: No respiratory distress.  Chest:     Chest wall: No tenderness.  Neurological:     Mental Status: She is alert and oriented to person, place, and time. Mental status is at baseline.  Psychiatric:        Attention and Perception:  She perceives auditory and visual hallucinations.        Mood and Affect: Mood is anxious and depressed.        Speech: Speech normal.        Behavior: Behavior is uncooperative.        Thought Content: Thought content normal.    Review of Systems  Constitutional:  Negative for chills, diaphoresis and fever.  HENT:  Negative for congestion.   Eyes:  Negative for discharge.  Respiratory:  Negative for cough, shortness of breath and wheezing.   Cardiovascular:  Negative for chest pain and palpitations.  Gastrointestinal:  Negative for diarrhea, nausea and vomiting.  Neurological:  Negative for dizziness, seizures, weakness and headaches.  Psychiatric/Behavioral:  Positive for depression and substance abuse. The patient is nervous/anxious.     Blood pressure (!) 155/115, pulse (!) 101, temperature 98.8 F (37.1 C), temperature source Oral, resp. rate 20, SpO2 100%. There is no height or weight on file to calculate BMI.  Past Psychiatric History: See H & P   Is the patient at risk to self? No  Has the patient been a risk to self in the past 6 months? No .    Has the patient been a risk to self within the distant past? No   Is the patient a risk to others? No  Has the patient been a risk to others in the past 6 months? No   Has the patient been a risk to others within the distant past? No   Past Medical History: See Chart  Family History: N/A  Social History: N/A  Last Labs:  Office Visit on 11/25/2023  Component Date Value Ref Range Status   Preg Test, Ur 11/25/2023 Negative  Negative Final   Hep B Core Total Ab 11/25/2023 Negative  Negative Final   Hep B Surface Ab, Qual 11/25/2023 Non Reactive   Final   Comment:              Non Reactive: Not immune to HBV infection.              Equivocal: Unable to determine if anti-HBs                         is present at levels consistent                         with immunity.               Reactive: Anti-HBs concentration detected                          at greater than 10 mIU/mL.                         Individual is considered to be                         immune to infection with HBV.    Hepatitis B Surface Ag 11/25/2023 Negative  Negative Final   HCV Ab 11/25/2023 Non Reactive  Non Reactive Final   HIV Screen 4th Generation wRfx 11/25/2023 Non Reactive  Non Reactive Final   Comment: HIV-1/HIV-2 antibodies and HIV-1 p24 antigen were NOT detected. There is no laboratory evidence of HIV infection. HIV Negative    Cholesterol, Total 11/25/2023 146  100 - 199 mg/dL Final   Triglycerides 16/09/9603 172 (H)  0 - 149 mg/dL Final   HDL 54/08/8118 35 (L)  >39 mg/dL Final   VLDL Cholesterol Cal 11/25/2023 30  5 - 40 mg/dL Final   LDL Chol Calc (NIH) 11/25/2023 81  0 - 99 mg/dL Final   Chol/HDL Ratio 11/25/2023 4.2  0.0 - 4.4 ratio Final   Comment:                                   T. Chol/HDL Ratio                                             Men  Women                               1/2 Avg.Risk  3.4    3.3                                   Avg.Risk  5.0    4.4  2X Avg.Risk  9.6    7.1                                3X Avg.Risk 23.4   11.0    Glucose 11/25/2023 99  70 - 99 mg/dL Final   BUN 13/24/4010 9  6 - 24 mg/dL Final   Creatinine, Ser 11/25/2023 0.82  0.57 - 1.00 mg/dL Final   eGFR 27/25/3664 93  >59 mL/min/1.73 Final   BUN/Creatinine Ratio 11/25/2023 11  9 - 23 Final   Sodium 11/25/2023 141  134 - 144 mmol/L Final   Potassium 11/25/2023 3.8  3.5 - 5.2 mmol/L Final   Chloride 11/25/2023 105  96 - 106 mmol/L Final   CO2 11/25/2023 22  20 - 29 mmol/L Final   Calcium 11/25/2023 9.0  8.7 - 10.2 mg/dL Final   RPR Ser Ql 40/34/7425 Non Reactive  Non Reactive Final   HCV Interp 1: 11/25/2023 Comment   Final   Comment: Not infected with HCV unless early or acute infection is suspected (which may be delayed in an immunocompromised individual), or other evidence exists to indicate HCV  infection.     Allergies: Lisinopril-hydrochlorothiazide, Paxil [paroxetine hcl], and Shellfish allergy  Medications:  Facility Ordered Medications  Medication   acetaminophen (TYLENOL) tablet 650 mg   alum & mag hydroxide-simeth (MAALOX/MYLANTA) 200-200-20 MG/5ML suspension 30 mL   magnesium hydroxide (MILK OF MAGNESIA) suspension 30 mL   dicyclomine (BENTYL) tablet 20 mg   hydrOXYzine (ATARAX) tablet 25 mg   loperamide (IMODIUM) capsule 2-4 mg   methocarbamol (ROBAXIN) tablet 500 mg   naproxen (NAPROSYN) tablet 500 mg   ondansetron (ZOFRAN-ODT) disintegrating tablet 4 mg   haloperidol (HALDOL) tablet 5 mg   And   diphenhydrAMINE (BENADRYL) capsule 50 mg   haloperidol lactate (HALDOL) injection 5 mg   And   diphenhydrAMINE (BENADRYL) injection 50 mg   And   LORazepam (ATIVAN) injection 2 mg   haloperidol lactate (HALDOL) injection 10 mg   And   diphenhydrAMINE (BENADRYL) injection 50 mg   And   LORazepam (ATIVAN) injection 2 mg   amLODipine (NORVASC) tablet 10 mg   brexpiprazole (REXULTI) tablet 0.5 mg      Medical Decision Making  Patient initially recommended for inpatient psychiatric admission for stabilization and treatment.  Patient refused and is requesting to leave AMA. Patient educated on the risk of leaving AMA without completing treatment.  Patient verbalizes understanding of education provided but insists on leaving me, stating she was seeking outpatient treatment.  Patient declined resources offered.  Patient denies SI/HI/AVH or paranoia.  Patient does not meet IVC criteria at this time.  There is no evidence of imminent risk of harm to self or others.  Discharge recommendations:  Patient is to take medications as prescribed. Please follow up with your primary care provider for all medical related needs.   Therapy: We recommend that patient participate in individual therapy to address mental health concerns.  Safety:  The patient should abstain from use  of illicit substances/drugs and abuse of any medications. If symptoms worsen or do not continue to improve or if the patient becomes actively suicidal or homicidal then it is recommended that the patient return to the closest hospital emergency department, the [facility], or call 911 for further evaluation and treatment. National Suicide Prevention Lifeline 1-800-SUICIDE or (770) 883-8198.  About 988 988 offers 24/7 access to trained  crisis counselors who can help people experiencing mental health-related distress. People can call or text 988 or chat 988lifeline.org for themselves or if they are worried about a loved one who may need crisis support.  Crisis Mobile: [facility]:                     754-036-4732 (for crisis response 24 hours a day) [facility] Hotline:                                            5141366246     Recommendations  Based on my evaluation the patient does not appear to have an emergency medical condition.  Patient is discharged AMA per her request. At time of discharge, pt denies SI/HI/.AVH or paranoia.   Mancel Bale, NP 03/09/24  4:10 AM

## 2024-03-09 NOTE — ED Provider Notes (Signed)
 [facility] Continuous Assessment Admission H&P  Date: 03/09/24 Patient Name: Lisa Crosby MRN: [id number] Chief Complaint: stress and needs to clear my head  Diagnoses:  Final diagnoses:  Homelessness unspecified  Episodic mood disorder (HCC)  Cocaine use disorder (HCC)    HPI: Lisa Crosby,  43 y/o female with a history of substance abuse, general anxiety, adjustment disorder,  homelessness and trauma,  presented to [facility] voluntarily.  Patient was seen yesterday and leave AMA.  Patient returned tonight stating that she is looking for help.  According to the patient my energy is off I have think I need to rest and get good sleep.  Patient reports that she has been using cocaine for the past 5 years.  Patient also stated that she has been going through a lot of trauma she just found out that her ex-boyfriend was sleeping with her son.  Patient does go off on a tantrum and her story seems to be inconsistent.  Patient stated that she just got an apartment today and paid her deposit.  However it does look as if patient is homeless because patient story keeps changing.  Patient stated she is not seeing a psychiatrist or therapist at this time denies ever being hospitalized for any psychiatric issues.  Face-to-face observation of patient, patient is alert and oriented x 4, speech is clear, maintained minimal eye contact.  Patient can become very defensive at times.  Does appear to be influenced by drugs however patient stated she has not used since yesterday.  Patient denies alcohol use denies any other illicit drug use apart from cocaine.  Patient story seems not to be adding up because when she came in yesterday she said she was doing math however today she said she only did cocaine.  Patient stated she wanted to see a therapist.  Patient denies SI, HI, AVH.  Denies access to guns denied wanting to hurt herself.    Recommend observation unit  Total Time spent with patient: 20  minutes  Musculoskeletal  Strength & Muscle Tone: within normal limits Gait & Station: normal Patient leans: N/A  Psychiatric Specialty Exam  Presentation General Appearance:  Casual  Eye Contact: Fair  Speech: Clear and Coherent  Speech Volume: Decreased  Handedness: Right   Mood and Affect  Mood: Anxious; Depressed  Affect: Congruent   Thought Process  Thought Processes: Coherent  Descriptions of Associations:Circumstantial  Orientation:Full (Time, Place and Person)  Thought Content:Obsessions  Diagnosis of Schizophrenia or Schizoaffective disorder in past: No   Hallucinations:Hallucinations: None Description of Auditory Hallucinations: Pt reports hearing voices/sounds, unable to make out what they are saying Description of Visual Hallucinations: Pt says she see's stuff, unable to describe what she sees  Ideas of Reference:Paranoia  Suicidal Thoughts:Suicidal Thoughts: No  Homicidal Thoughts:Homicidal Thoughts: No   Sensorium  Memory: Immediate Fair  Judgment: Poor  Insight: Fair   Chartered certified accountant: Fair  Attention Span: Fair  Recall: Fair  Fund of Knowledge: Fair  Language: Fair   Psychomotor Activity  Psychomotor Activity: Psychomotor Activity: Normal   Assets  Assets: Desire for Improvement; Housing; Vocational/Educational   Sleep  Sleep: Sleep: Fair Number of Hours of Sleep: 6   Nutritional Assessment (For OBS and FBC admissions only) Has the patient had a weight loss or gain of 10 pounds or more in the last 3 months?: No Has the patient had a decrease in food intake/or appetite?: No Does the patient have dental problems?: No Does the patient have  eating habits or behaviors that may be indicators of an eating disorder including binging or inducing vomiting?: No Has the patient recently lost weight without trying?: 0 Has the patient been eating poorly because of a decreased appetite?:  0 Malnutrition Screening Tool Score: 0    Physical Exam HENT:     Head: Normocephalic.     Nose: Nose normal.  Eyes:     Pupils: Pupils are equal, round, and reactive to light.  Cardiovascular:     Rate and Rhythm: Tachycardia present.  Pulmonary:     Effort: Pulmonary effort is normal.  Musculoskeletal:        General: Normal range of motion.     Cervical back: Normal range of motion.  Neurological:     General: No focal deficit present.     Mental Status: She is alert.  Psychiatric:        Mood and Affect: Mood normal.        Behavior: Behavior normal.        Thought Content: Thought content normal.        Judgment: Judgment normal.    Review of Systems  Constitutional: Negative.   HENT: Negative.    Eyes: Negative.   Respiratory: Negative.    Cardiovascular: Negative.   Gastrointestinal: Negative.   Genitourinary: Negative.   Musculoskeletal: Negative.   Skin: Negative.   Neurological: Negative.   Psychiatric/Behavioral:  Positive for depression and substance abuse. The patient is nervous/anxious.     Blood pressure (!) 130/90, pulse 90, temperature 98.5 F (36.9 C), temperature source Oral, resp. rate 20, SpO2 97%. There is no height or weight on file to calculate BMI.  Past Psychiatric History: General Anxiety, substance abuse, homelessness  Is the patient at risk to self? No  Has the patient been a risk to self in the past 6 months? No .    Has the patient been a risk to self within the distant past? No   Is the patient a risk to others? No   Has the patient been a risk to others in the past 6 months? No   Has the patient been a risk to others within the distant past? No   Past Medical History: See chart  Family History: No known  Social History: Cocaine abuse  Last Labs:  Office Visit on 11/25/2023  Component Date Value Ref Range Status   Preg Test, Ur 11/25/2023 Negative  Negative Final   Hep B Core Total Ab 11/25/2023 Negative  Negative Final    Hep B Surface Ab, Qual 11/25/2023 Non Reactive   Final   Comment:              Non Reactive: Not immune to HBV infection.              Equivocal: Unable to determine if anti-HBs                         is present at levels consistent                         with immunity.               Reactive: Anti-HBs concentration detected                         at greater than 10 mIU/mL.  Individual is considered to be                         immune to infection with HBV.    Hepatitis B Surface Ag 11/25/2023 Negative  Negative Final   HCV Ab 11/25/2023 Non Reactive  Non Reactive Final   HIV Screen 4th Generation wRfx 11/25/2023 Non Reactive  Non Reactive Final   Comment: HIV-1/HIV-2 antibodies and HIV-1 p24 antigen were NOT detected. There is no laboratory evidence of HIV infection. HIV Negative    Cholesterol, Total 11/25/2023 146  100 - 199 mg/dL Final   Triglycerides 54/08/8118 172 (H)  0 - 149 mg/dL Final   HDL 14/78/2956 35 (L)  >39 mg/dL Final   VLDL Cholesterol Cal 11/25/2023 30  5 - 40 mg/dL Final   LDL Chol Calc (NIH) 11/25/2023 81  0 - 99 mg/dL Final   Chol/HDL Ratio 11/25/2023 4.2  0.0 - 4.4 ratio Final   Comment:                                   T. Chol/HDL Ratio                                             Men  Women                               1/2 Avg.Risk  3.4    3.3                                   Avg.Risk  5.0    4.4                                2X Avg.Risk  9.6    7.1                                3X Avg.Risk 23.4   11.0    Glucose 11/25/2023 99  70 - 99 mg/dL Final   BUN 21/30/8657 9  6 - 24 mg/dL Final   Creatinine, Ser 11/25/2023 0.82  0.57 - 1.00 mg/dL Final   eGFR 84/69/6295 93  >59 mL/min/1.73 Final   BUN/Creatinine Ratio 11/25/2023 11  9 - 23 Final   Sodium 11/25/2023 141  134 - 144 mmol/L Final   Potassium 11/25/2023 3.8  3.5 - 5.2 mmol/L Final   Chloride 11/25/2023 105  96 - 106 mmol/L Final   CO2 11/25/2023 22  20 - 29 mmol/L  Final   Calcium 11/25/2023 9.0  8.7 - 10.2 mg/dL Final   RPR Ser Ql 28/41/3244 Non Reactive  Non Reactive Final   HCV Interp 1: 11/25/2023 Comment   Final   Comment: Not infected with HCV unless early or acute infection is suspected (which may be delayed in an immunocompromised individual), or other evidence exists to indicate HCV infection.     Allergies: Lisinopril-hydrochlorothiazide, Paxil [paroxetine hcl], and Shellfish allergy  Medications:     Medical Decision Making  Observation unit    Recommendations  Based on my evaluation the patient does not appear to have an emergency medical condition.  Sindy Guadeloupe, NP 03/09/24  10:46 PM

## 2024-03-10 ENCOUNTER — Encounter (HOSPITAL_COMMUNITY): Payer: Self-pay

## 2024-03-10 ENCOUNTER — Emergency Department (HOSPITAL_COMMUNITY)
Admission: EM | Admit: 2024-03-10 | Discharge: 2024-03-10 | Disposition: A | Discharge status: Home / Self Care | Attending: Emergency Medicine | Admitting: Emergency Medicine

## 2024-03-10 ENCOUNTER — Other Ambulatory Visit: Payer: Self-pay

## 2024-03-10 DIAGNOSIS — Z765 Malingerer [conscious simulation]: Secondary | ICD-10-CM | POA: Diagnosis not present

## 2024-03-10 DIAGNOSIS — F1510 Other stimulant abuse, uncomplicated: Secondary | ICD-10-CM | POA: Diagnosis present

## 2024-03-10 DIAGNOSIS — R45851 Suicidal ideations: Secondary | ICD-10-CM | POA: Diagnosis not present

## 2024-03-10 DIAGNOSIS — F1410 Cocaine abuse, uncomplicated: Secondary | ICD-10-CM | POA: Diagnosis not present

## 2024-03-10 HISTORY — DX: Cocaine abuse, uncomplicated: F14.10

## 2024-03-10 HISTORY — DX: Other stimulant abuse, uncomplicated: F15.10

## 2024-03-10 HISTORY — DX: Malingerer (conscious simulation): Z76.5

## 2024-03-10 LAB — COMPREHENSIVE METABOLIC PANEL WITH GFR
ALT: 20 U/L (ref 0–44)
AST: 21 U/L (ref 15–41)
Albumin: 3.3 g/dL — ABNORMAL LOW (ref 3.5–5.0)
Alkaline Phosphatase: 88 U/L (ref 38–126)
Anion gap: 11 (ref 5–15)
BUN: 7 mg/dL (ref 6–20)
CO2: 20 mmol/L — ABNORMAL LOW (ref 22–32)
Calcium: 8.8 mg/dL — ABNORMAL LOW (ref 8.9–10.3)
Chloride: 107 mmol/L (ref 98–111)
Creatinine, Ser: 0.85 mg/dL (ref 0.44–1.00)
GFR, Estimated: >60 mL/min (ref >60)
Glucose, Bld: 94 mg/dL (ref 70–99)
Potassium: 3.4 mmol/L — ABNORMAL LOW (ref 3.5–5.1)
Sodium: 138 mmol/L (ref 135–145)
Total Bilirubin: 0.3 mg/dL (ref 0.0–1.2)
Total Protein: 6.6 g/dL (ref 6.5–8.1)

## 2024-03-10 LAB — RAPID URINE DRUG SCREEN, HOSP PERFORMED
Amphetamines: POSITIVE — AB
Barbiturates: NOT DETECTED
Benzodiazepines: NOT DETECTED
Cocaine: POSITIVE — AB
Opiates: NOT DETECTED
Tetrahydrocannabinol: NOT DETECTED

## 2024-03-10 LAB — CBC
HCT: 39.1 % (ref 36.0–46.0)
Hemoglobin: 13.1 g/dL (ref 12.0–15.0)
MCH: 29.8 pg (ref 26.0–34.0)
MCHC: 33.5 g/dL (ref 30.0–36.0)
MCV: 89.1 fL (ref 80.0–100.0)
Platelets: 429 10*3/uL — ABNORMAL HIGH (ref 150–400)
RBC: 4.39 MIL/uL (ref 3.87–5.11)
RDW: 13 % (ref 11.5–15.5)
WBC: 8.5 10*3/uL (ref 4.0–10.5)
nRBC: 0 % (ref 0.0–0.2)

## 2024-03-10 LAB — ACETAMINOPHEN LEVEL: Acetaminophen (Tylenol), Serum: <10 ug/mL — ABNORMAL LOW (ref 10–30)

## 2024-03-10 LAB — SALICYLATE LEVEL: Salicylate Lvl: <7 mg/dL — ABNORMAL LOW (ref 7.0–30.0)

## 2024-03-10 LAB — HCG, SERUM, QUALITATIVE: Preg, Serum: NEGATIVE

## 2024-03-10 LAB — ETHANOL: Alcohol, Ethyl (B): <10 mg/dL (ref <10)
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
## 2024-03-10 NOTE — Discharge Instructions (Signed)
-  Recommend close outpatient follow-up with outpatient psychiatry and substance abuse, if/when amenable -Recommend return safety precautions -Recommend consider abstaining from illicit substance use.

## 2024-03-10 NOTE — ED Notes (Signed)
 Patient is sitting by stretcher, stating she jut needs a minute to see who she can call to come get her, she doesn't have the number to call her friend, when offered to call sister for her patient refused, offered bus pass and to call taxi for patient which she refused for both. Patient offered to sit out in ED lobby until she can get in touch with someone. Patient stated she's getting ready to leave and just needs another minute.

## 2024-03-10 NOTE — ED Triage Notes (Addendum)
 Pt reports "my life is in turmoil, its a lot of stuff happening." She reports she is feeling suicidal. She continues to repeat:" I feel tired." She reports her plan would be to overdose on pills "something easy so I can just go to sleep." She reports her life is usually going well. She is financially stable but now she is unhappy and doesn't feel life is worth living.  Denies illegal drug use or alcohol use. Denies A/V hallucinations.   Note edit: pt has now admitted to smoking meth, she reports she has been using a vape pen and thinks she may be addicted.

## 2024-03-10 NOTE — ED Provider Notes (Signed)
 Emergency Medicine Observation Re-evaluation Note  Lisa Crosby is a 43 y.o. female, seen on rounds today.  Pt initially presented to the ED for complaints of Suicidal Currently, the patient is resting on stretcher.  Physical Exam  BP 131/89   Pulse 81   Temp 98.2 F (36.8 C)   Resp 18   Ht 5\' 4"  (1.626 m)   Wt 98.4 kg   LMP 03/03/2024 (Approximate)   SpO2 99%   BMI 37.25 kg/m  Physical Exam General: Laying on stretcher Cardiac: Extremities well-perfused Lungs: Breathing is unlabored Psych: No agitation  ED Course / MDM  EKG:   I have reviewed the labs performed to date as well as medications administered while in observation.  Recent changes in the last 24 hours include presentation to the emergency department for vague suicidal thoughts.  She has been medically cleared.  TTS has evaluated and patient is now psychiatrically cleared.  Plan  Current plan is for discharge.    Gloris Manchester, MD 03/10/24 [phone]

## 2024-03-10 NOTE — ED Notes (Addendum)
 Pt wanded by security.

## 2024-03-10 NOTE — ED Notes (Signed)
 Patient walked out to ED lobby with a transporter.

## 2024-03-10 NOTE — ED Provider Notes (Signed)
 [facility]-EMERGENCY DEPT [facility] Emergency Department Provider Note MRN:  [id number]  Arrival date & time: 03/10/24     Chief Complaint   Suicidal   History of Present Illness   Lisa Crosby is a 43 y.o. year-old female presents to the ED with chief complaint of having too much going on in her life.  She states that she is tired.  She states that her life has been in turmoil.  She has been seen several times to behavioral health urgent care and has been admitted, but has repeatedly left AGAINST MEDICAL ADVICE.  She states that she has not well treated over at behavioral health urgent care, so she wanted to come here for further evaluation.  She denies drug or alcohol use.  Denies any hallucinations.  She states that she has felt suicidal, but does not have a clear plan.  Denies any recent illnesses.  History provided by patient.   Review of Systems  Pertinent positive and negative review of systems noted in HPI.    Physical Exam   Vitals:   03/10/24 0111 03/10/24 0407  BP: (!) 136/99 131/89  Pulse: 86 81  Resp: 18 18  Temp: 98.4 F (36.9 C) 98.2 F (36.8 C)  SpO2: 100% 99%    CONSTITUTIONAL:  non toxic-appearing, NAD NEURO:  Alert and oriented x 3, CN 3-12 grossly intact EYES:  eyes equal and reactive ENT/NECK:  Supple, no stridor  CARDIO:  normal rate, appears well-perfused  PULM:  No respiratory distress, no wheezing GI/GU:  non-distended,  MSK/SPINE:  No gross deformities, no edema, moves all extremities  SKIN:  no rash, atraumatic   *Additional and/or pertinent findings included in MDM below  Diagnostic and Interventional Summary    EKG Interpretation Date/Time:    Ventricular Rate:    PR Interval:    QRS Duration:    QT Interval:    QTC Calculation:   R Axis:      Text Interpretation:         Labs Reviewed  COMPREHENSIVE METABOLIC PANEL WITH GFR - Abnormal; Notable for the following components:      Result Value   Potassium 3.4 (*)    CO2 20  (*)    Calcium 8.8 (*)    Albumin 3.3 (*)    All other components within normal limits  SALICYLATE LEVEL - Abnormal; Notable for the following components:   Salicylate Lvl <7.0 (*)    All other components within normal limits  ACETAMINOPHEN LEVEL - Abnormal; Notable for the following components:   Acetaminophen (Tylenol), Serum <10 (*)    All other components within normal limits  CBC - Abnormal; Notable for the following components:   Platelets 429 (*)    All other components within normal limits  RAPID URINE DRUG SCREEN, HOSP PERFORMED - Abnormal; Notable for the following components:   Cocaine POSITIVE (*)    Amphetamines POSITIVE (*)    All other components within normal limits  ETHANOL  HCG, SERUM, QUALITATIVE    No orders to display    Medications - No data to display   Procedures  /  Critical Care Procedures  ED Course and Medical Decision Making  I have reviewed the triage vital signs, the nursing notes, and pertinent available records from the EMR.  Social Determinants Affecting Complexity of Care: Patient has no clinically significant social determinants affecting this chief complaint..   ED Course:    Medical Decision Making Patient here with depressed mood and suicidal  thoughts.  Has been seen recently at behavioral health urgent care.  She states that she is not treated well over there, so this was her reason for leaving.  She had been evaluated by psychiatry and had been recommended for observation, but patient ultimately left.  Labs and workup here reassuring.  She is positive for cocaine and amphetamines.  She states that she needs somewhere to sleep and feels tired.  She has expressed vague suicidal thoughts.  I will plan to check back on patient in a few hours after she slept and see how she is feeling.  If still feeling suicidal, will reconsult TTS.  Feeling better, will have patient follow-up with outpatient psychiatry.  Amount and/or Complexity of Data  Reviewed Labs: ordered.         Consultants: Patient still complains of SI.  Will consult TTS.   Treatment and Plan: TTS consult pending.    Final Clinical Impressions(s) / ED Diagnoses     ICD-10-CM   1. Suicidal ideation  R45.851       ED Discharge Orders     None         Discharge Instructions Discussed with and Provided to Patient:   Discharge Instructions   None      Roxy Horseman, PA-C 03/10/24 1610    Palumbo, April, MD 03/10/24 0630

## 2024-03-10 NOTE — ED Notes (Signed)
 Patient given discharge paper work and given opportunity to ask question, patient reluctant to leave just laying on bed stating "I just need a place to sleep", Patient informed she has been given resources for shelter. Patient stated "you think I dont have a place to go? Its not about the shelter I have places to stay."

## 2024-03-10 NOTE — ED Notes (Signed)
 Pt states she threw her own clothes away. Pt stated "I just want a fresh start"

## 2024-03-23 DIAGNOSIS — F332 Major depressive disorder, recurrent severe without psychotic features: Secondary | ICD-10-CM | POA: Insufficient documentation

## 2024-05-04 ENCOUNTER — Emergency Department (HOSPITAL_COMMUNITY)
Admission: EM | Admit: 2024-05-04 | Discharge: 2024-05-05 | Disposition: A | Discharge status: Other Acute Inpatient Hospital | Payer: MEDICAID | Attending: Emergency Medicine | Admitting: Emergency Medicine

## 2024-05-04 ENCOUNTER — Other Ambulatory Visit: Payer: Self-pay

## 2024-05-04 ENCOUNTER — Encounter (HOSPITAL_COMMUNITY): Payer: Self-pay

## 2024-05-04 ENCOUNTER — Telehealth: Payer: Self-pay | Admitting: Student

## 2024-05-04 DIAGNOSIS — E876 Hypokalemia: Secondary | ICD-10-CM | POA: Diagnosis not present

## 2024-05-04 DIAGNOSIS — F15959 Other stimulant use, unspecified with stimulant-induced psychotic disorder, unspecified: Secondary | ICD-10-CM | POA: Diagnosis present

## 2024-05-04 DIAGNOSIS — F1520 Other stimulant dependence, uncomplicated: Secondary | ICD-10-CM | POA: Diagnosis not present

## 2024-05-04 DIAGNOSIS — F15951 Other stimulant use, unspecified with stimulant-induced psychotic disorder with hallucinations: Secondary | ICD-10-CM | POA: Diagnosis not present

## 2024-05-04 DIAGNOSIS — F1420 Cocaine dependence, uncomplicated: Secondary | ICD-10-CM | POA: Diagnosis present

## 2024-05-04 DIAGNOSIS — R45850 Homicidal ideations: Secondary | ICD-10-CM | POA: Insufficient documentation

## 2024-05-04 DIAGNOSIS — R440 Auditory hallucinations: Secondary | ICD-10-CM | POA: Insufficient documentation
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
## 2024-05-04 NOTE — ED Notes (Signed)
 Pt stepped out of triage. States that she needs to charge her phone in the lobby and will be back to her room in triage

## 2024-05-05 ENCOUNTER — Emergency Department (HOSPITAL_COMMUNITY)
Admission: EM | Admit: 2024-05-05 | Discharge: 2024-05-06 | Disposition: A | Discharge status: Other Acute Inpatient Hospital | Payer: MEDICAID | Source: Home / Self Care | Attending: Emergency Medicine | Admitting: Emergency Medicine

## 2024-05-05 ENCOUNTER — Ambulatory Visit: Admitting: Student

## 2024-05-05 ENCOUNTER — Encounter (HOSPITAL_COMMUNITY): Payer: Self-pay | Admitting: Emergency Medicine

## 2024-05-05 ENCOUNTER — Encounter (HOSPITAL_COMMUNITY): Payer: Self-pay | Admitting: Psychiatry

## 2024-05-05 ENCOUNTER — Ambulatory Visit (HOSPITAL_COMMUNITY)
Admission: EM | Admit: 2024-05-05 | Discharge: 2024-05-05 | Disposition: A | Discharge status: Home / Self Care | Payer: MEDICAID

## 2024-05-05 ENCOUNTER — Other Ambulatory Visit: Payer: Self-pay

## 2024-05-05 DIAGNOSIS — F1520 Other stimulant dependence, uncomplicated: Secondary | ICD-10-CM | POA: Diagnosis present

## 2024-05-05 DIAGNOSIS — R45850 Homicidal ideations: Secondary | ICD-10-CM | POA: Insufficient documentation

## 2024-05-05 DIAGNOSIS — F15951 Other stimulant use, unspecified with stimulant-induced psychotic disorder with hallucinations: Secondary | ICD-10-CM | POA: Diagnosis present

## 2024-05-05 DIAGNOSIS — E876 Hypokalemia: Secondary | ICD-10-CM | POA: Insufficient documentation

## 2024-05-05 DIAGNOSIS — F1420 Cocaine dependence, uncomplicated: Secondary | ICD-10-CM | POA: Diagnosis present

## 2024-05-05 DIAGNOSIS — F15959 Other stimulant use, unspecified with stimulant-induced psychotic disorder, unspecified: Secondary | ICD-10-CM | POA: Insufficient documentation

## 2024-05-05 LAB — CBC WITH DIFFERENTIAL/PLATELET
Abs Immature Granulocytes: 0.05 10*3/uL (ref 0.00–0.07)
Abs Immature Granulocytes: 0.03 10*3/uL (ref 0.00–0.07)
Basophils Absolute: 0 10*3/uL (ref 0.0–0.1)
Basophils Absolute: 0 10*3/uL (ref 0.0–0.1)
Basophils Relative: 0 %
Basophils Relative: 0 %
Eosinophils Absolute: 0.1 10*3/uL (ref 0.0–0.5)
Eosinophils Absolute: 0.2 10*3/uL (ref 0.0–0.5)
Eosinophils Relative: 1 %
Eosinophils Relative: 2 %
HCT: 39.4 % (ref 36.0–46.0)
HCT: 37.9 % (ref 36.0–46.0)
Hemoglobin: 13.3 g/dL (ref 12.0–15.0)
Hemoglobin: 12.7 g/dL (ref 12.0–15.0)
Immature Granulocytes: 1 %
Immature Granulocytes: 0 %
Lymphocytes Relative: 35 %
Lymphocytes Relative: 34 %
Lymphs Abs: 3.5 10*3/uL (ref 0.7–4.0)
Lymphs Abs: 3.2 10*3/uL (ref 0.7–4.0)
MCH: 29.6 pg (ref 26.0–34.0)
MCH: 29.4 pg (ref 26.0–34.0)
MCHC: 33.8 g/dL (ref 30.0–36.0)
MCHC: 33.5 g/dL (ref 30.0–36.0)
MCV: 87.8 fL (ref 80.0–100.0)
MCV: 87.7 fL (ref 80.0–100.0)
Monocytes Absolute: 0.8 10*3/uL (ref 0.1–1.0)
Monocytes Absolute: 0.7 10*3/uL (ref 0.1–1.0)
Monocytes Relative: 8 %
Monocytes Relative: 7 %
Neutro Abs: 5.6 10*3/uL (ref 1.7–7.7)
Neutro Abs: 5.3 10*3/uL (ref 1.7–7.7)
Neutrophils Relative %: 55 %
Neutrophils Relative %: 57 %
Platelets: 373 10*3/uL (ref 150–400)
Platelets: 386 10*3/uL (ref 150–400)
RBC: 4.49 MIL/uL (ref 3.87–5.11)
RBC: 4.32 MIL/uL (ref 3.87–5.11)
RDW: 12.7 % (ref 11.5–15.5)
RDW: 12.9 % (ref 11.5–15.5)
WBC: 10.1 10*3/uL (ref 4.0–10.5)
WBC: 9.5 10*3/uL (ref 4.0–10.5)
nRBC: 0 % (ref 0.0–0.2)
nRBC: 0 % (ref 0.0–0.2)

## 2024-05-05 LAB — COMPREHENSIVE METABOLIC PANEL WITH GFR
ALT: 19 U/L (ref 0–44)
AST: 19 U/L (ref 15–41)
Albumin: 3.6 g/dL (ref 3.5–5.0)
Alkaline Phosphatase: 79 U/L (ref 38–126)
Anion gap: 11 (ref 5–15)
BUN: 7 mg/dL (ref 6–20)
CO2: 21 mmol/L — ABNORMAL LOW (ref 22–32)
Calcium: 9 mg/dL (ref 8.9–10.3)
Chloride: 106 mmol/L (ref 98–111)
Creatinine, Ser: 0.86 mg/dL (ref 0.44–1.00)
GFR, Estimated: >60 mL/min (ref >60)
Glucose, Bld: 102 mg/dL — ABNORMAL HIGH (ref 70–99)
Potassium: 3.1 mmol/L — ABNORMAL LOW (ref 3.5–5.1)
Sodium: 138 mmol/L (ref 135–145)
Total Bilirubin: 0.6 mg/dL (ref 0.0–1.2)
Total Protein: 7 g/dL (ref 6.5–8.1)

## 2024-05-05 LAB — HCG, SERUM, QUALITATIVE: Preg, Serum: NEGATIVE

## 2024-05-05 LAB — ETHANOL: Alcohol, Ethyl (B): <15 mg/dL (ref <15)

## 2024-05-05 MED ORDER — STERILE WATER FOR INJECTION IJ SOLN
INTRAMUSCULAR | Status: AC
  Filled 2024-05-05: qty 10

## 2024-05-05 MED ORDER — STERILE WATER FOR INJECTION IJ SOLN
INTRAMUSCULAR | Status: AC
  Administered 2024-05-06: 10 mL
  Filled 2024-05-05: qty 10

## 2024-05-05 MED ORDER — POTASSIUM CHLORIDE CRYS ER 20 MEQ PO TBCR: 40.0000 meq | EXTENDED_RELEASE_TABLET | Freq: Once | ORAL | Status: DC

## 2024-05-05 MED ORDER — ZIPRASIDONE MESYLATE 20 MG IM SOLR
10.0000 mg | Freq: Once | INTRAMUSCULAR | Status: AC
  Administered 2024-05-06: 10 mg via INTRAMUSCULAR
  Filled 2024-05-05: qty 20

## 2024-05-05 MED ORDER — ZIPRASIDONE MESYLATE 20 MG IM SOLR
20.0000 mg | Freq: Once | INTRAMUSCULAR | Status: AC
  Administered 2024-05-05: 20 mg via INTRAMUSCULAR
  Filled 2024-05-05: qty 20
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
## 2024-05-05 NOTE — ED Notes (Signed)
 Ginalea Farmer-sister [phone]. Pt's sister states she just got off the phone with pt and stated pt said if the voices don't stop she was going to end her life tonight.

## 2024-05-05 NOTE — ED Notes (Signed)
 Case number [id number]  has been added in epic, Original copy of paperwork has been added to red folder. 3 copies in green zone purple clip-board. 1 copy with stickers in medical records. Findings has been scanned in and , filed into current case.

## 2024-05-05 NOTE — ED Notes (Signed)
 Pt wanded by security and changed into burgundy scrubs.

## 2024-05-05 NOTE — ED Notes (Shared)
 Recinding pt envelope number [id number],
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
## 2024-05-05 NOTE — ED Notes (Signed)
 Envelope #[id number] has been submitted successfully.

## 2024-05-05 NOTE — ED Provider Notes (Signed)
 Patient cleared by psychiatry for outpatient management.   Trish Furl, MD 05/05/24 [phone]

## 2024-05-05 NOTE — ED Notes (Signed)
 Pt refused to leave after discharge and discharge paper work reviewed. Security called to bedside  and escorted pt out . Pt refused to take discharge paper work with her or several items. Items put in bag and kept at purple desk.

## 2024-05-05 NOTE — ED Provider Notes (Signed)
 [place] EMERGENCY DEPARTMENT AT [facility] Provider Note   [name]: [id number] Arrival date & time: 05/05/24  2125     History  Chief Complaint  Patient presents with   Psychiatric Evaluation    Lisa Crosby is a 43 y.o. female history of cocaine use, meth use, hallucinations secondary to medications presented for homicidal ideation along with auditory hallucinations.  Patient was seen earlier last night and ultimately discharged at noon today as they determined that her symptoms are secondary to her meth use that she used yesterday.  Patient states that she has thoughts of hurting others and when I ask her about this she states "the roads will run rampant with blood in [place] if my kids are harmed in any way shape or form" and patient states that she can foresee the future and has precognition and can foresee her children being hurt by an unknown assailant.  Patient was brought in for psych eval.    Home Medications Prior to Admission medications   Medication Sig Start Date End Date Taking? Authorizing Provider  amLODipine  (NORVASC ) 10 MG tablet Take 10 mg by mouth at bedtime. Patient not taking: Reported on 05/05/2024 03/26/24 06/24/24  [provider]  buPROPion (WELLBUTRIN XL) 150 MG 24 hr tablet Take 150 mg by mouth daily. Patient not taking: Reported on 05/05/2024    [provider]  busPIRone  (BUSPAR ) 10 MG tablet Take 10 mg by mouth 3 (three) times daily. Patient not taking: Reported on 05/05/2024    [provider]  citalopram  (CELEXA ) 20 MG tablet Take 20 mg by mouth daily. Patient not taking: Reported on 05/05/2024    [provider]  hydrOXYzine  (ATARAX ) 50 MG tablet Take 50 mg by mouth every 6 (six) hours as needed for anxiety. Patient not taking: Reported on 05/05/2024    [provider]  melatonin 3 MG TABS tablet Take 6 mg by mouth at bedtime. Patient not taking: Reported on 05/05/2024    [provider]   mirtazapine (REMERON) 7.5 MG tablet Take 7.5 mg by mouth at bedtime. Patient not taking: Reported on 05/05/2024    [provider]  nicotine  (NICODERM CQ  - DOSED IN MG/24 HOURS) 14 mg/24hr patch Place 14 mg onto the skin daily. Patient not taking: Reported on 05/05/2024    [provider]  traZODone (DESYREL) 100 MG tablet Take 100 mg by mouth at bedtime. Patient not taking: Reported on 05/05/2024    [provider]      Allergies    Shellfish allergy, Lisinopril -hydrochlorothiazide , and Paxil  [paroxetine  hcl]    Review of Systems   Review of Systems  Physical Exam Updated Vital Signs Ht 5\' 4"  (1.626 m)   Wt 98.4 kg   BMI 37.24 kg/m  Physical Exam Constitutional:      General: She is not in acute distress.    Comments: Easily agitated  Cardiovascular:     Rate and Rhythm: Normal rate and regular rhythm.     Pulses: Normal pulses.     Heart sounds: Normal heart sounds.  Pulmonary:     Effort: Pulmonary effort is normal. No respiratory distress.     Breath sounds: Normal breath sounds.  Abdominal:     Palpations: Abdomen is soft.     Tenderness: There is no abdominal tenderness. There is no guarding or rebound.  Skin:    General: Skin is warm and dry.     Capillary Refill: Capillary refill takes less than 2 seconds.  Neurological:     Mental Status: She is alert and oriented to person, place, and time.  Psychiatric:     Comments: Endorsing homicidal ideation but no suicidal ideation with me     ED Results / Procedures / Treatments   Labs (all labs ordered are listed, but only abnormal results are displayed) Labs Reviewed  COMPREHENSIVE METABOLIC PANEL WITH GFR  ETHANOL  RAPID URINE DRUG SCREEN, HOSP PERFORMED  CBC WITH DIFFERENTIAL/PLATELET  HCG, SERUM, QUALITATIVE    EKG None  Radiology No results found.  Procedures Procedures    Medications Ordered in ED Medications - No data to display  ED Course/ Medical Decision Making/  A&P                                 Medical Decision Making Amount and/or Complexity of Data Reviewed Labs: ordered.  Risk Prescription drug management.   Lisa Crosby 43 y.o. presented today for psych evaluation. Working DDx that I considered at this time includes, but not limited to, primary psychosis, substance-induced psychosis, mood disturbance, SI/HI.  R/o DDx: primary psychosis, mood disturbance, SI: These are considered less likely due to history of present illness, physical exam, labs/imaging findings  Review of prior external notes: 03/23/2024 admission  Unique Tests and My Independent Interpretation:  CBC: Pending CMP: Pending Ethanol: Pending hCG serum qualitative: Pending UDS: Pending EKG: Pending  Social Determinants of Health: EtOH/Substance Abuse  Discussion with Independent Historian: None  Discussion of Management of Tests: TTS  Risk: High: hospitalization or escalation of hospital-level care  Risk Stratification Score: None  Plan: Patient presented for psychiatric evaluation.  Patient is endorsing homicidal ideation.  Patient does have a history of anxiety, adjustment disorder for which they are on Wellbutrin, BuSpar , Celexa , Atarax , Remeron, trazodone.  They are not compliant with their medications. On my initial exam, the pt was was able to give appropriate responses however appears easily agitated and has tangential thoughts and actively endorsing homicidal ideation.  Vital signs reviewed and reassuring. With the patient's presentation of onset ideation, patient warrants emergent psychiatric consultation.   Patient immediately placed into ED psychiatric hold protocol including suicide precautions, elopement precautions and vital sign monitoring. Emergent behavioral health hold signed and notarized while awaiting psychiatric consultation due to threat to self or others. TTS consulted for further evaluation once patient medically cleared.  Patient signed  out to Lisa Crosby, [place].  Please review their note for the continuation of patient's care.  The plan at this point is follow-up on labs for medical clearance.  This chart was dictated using voice recognition software.  Despite best efforts to proofread,  errors can occur which can change the documentation meaning.        Final Clinical Impression(s) / ED Diagnoses Final diagnoses:  None    Rx / DC Orders ED Discharge Orders     None         Elex Grimmer 05/05/24 2330    Burnette Carte, MD 05/06/24 [phone]

## 2024-05-05 NOTE — ED Notes (Signed)
 Pt told of the need for urine sample. Pt told RN to comeback latter so they could produce sample. Pt used restroom without allowing collection of sample.
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
## 2024-05-05 NOTE — ED Notes (Signed)
 IVC is current exp 6/4//2025

## 2024-05-05 NOTE — Progress Notes (Signed)
   05/05/24 1805  [facility] Triage Screening (Walk-ins at [facility] only)  How Did You Hear About Us ? Self  What Is the Reason for Your Visit/Call Today? 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 Pt reports smoking meth "all day" for the past couple weeks but has not used since yesterday. Pt endorses AH of voices saying that a guy is at her ex-husband house and that her kids are crying. Pt reports going to ED yesterday due to voices and was discharged. Pt has some insight into drug use and exacerbated mental health symptoms. Pt apparently sent the police to her family house thinking they were in danger. Pt does not appear to be responding to internal stimuli. Pt denies SI, HI, VH.  How Long Has This Been Causing You Problems? 1-6 months  Have You Recently Had Any Thoughts About Hurting Yourself? No  Are You Planning to Commit Suicide/Harm Yourself At This time? No  Have you Recently Had Thoughts About Hurting Someone Marigene Shoulder? No  How long ago did you have thoughts of harming others? NA  Are You Planning To Harm Someone At This Time? No  Physical Abuse Denies  Verbal Abuse Denies  Sexual Abuse Denies  Exploitation of patient/patient's resources Denies  Self-Neglect Denies  Are you currently experiencing any auditory, visual or other hallucinations? Yes  Please explain the hallucinations you are currently experiencing: SEE NOTE  Have You Used Any Alcohol or Drugs in the Past 24 Hours? No  What Did You Use and How Much? LAST USE OF METH WAS YESTERDAY  Do you have any current medical co-morbidities that require immediate attention? No  Clinician description of patient physical appearance/behavior: CALM  What Do You Feel Would Help You the Most Today? Treatment for Depression or other mood problem;Alcohol or Drug Use Treatment  If access to [facility] was not available, would you have sought care in the Emergency  Department? No  Determination of Need Urgent (48 hours)  Options For Referral Medication Management;Outpatient Therapy;Inpatient Hospitalization;Facility-Based Crisis  Determination of Need filed? Yes

## 2024-05-05 NOTE — ED Notes (Signed)
 Patient arrived to Purple zone w/o restraints and sleeping; Staff will switch to hospital bed once patients is alert; Pt breathing WNL-Monique,RN

## 2024-05-05 NOTE — ED Notes (Signed)
 Attempted to return family call, third party answered phone.

## 2024-05-05 NOTE — ED Notes (Signed)
 According to the note placed by Dr. Annabell Key: "Patient reports that she is hearing voices and is having a psychotic breakdown. She reports she is also seeing people. They are telling her she should find and kill her ex-boyfriend. She was recently released from a rehab facility in [place]. She has not been taking her mental health medications since being out of the facility.    Pt has been asked to come back to triage to change into burgundy scrubs. Charge, RN informed of the situation and was told to bring the patient to [id number]. Pt's daughter is with her.

## 2024-05-05 NOTE — ED Notes (Signed)
 Pt wanded by security.

## 2024-05-05 NOTE — ED Notes (Deleted)
 IVC:  ENVELOPE: [id number]

## 2024-05-05 NOTE — ED Triage Notes (Signed)
 Pt BIB [employer] from [facility] with c/o hearing voices, paranoia of someone wanting to injury her and her child. Seen here last night. Endorses meth use per [facility].

## 2024-05-05 NOTE — BH Specialist Note (Signed)
 RN family came to notify providers including this Clinical research associate that patient was yelling and screaming stating she was having hallucinations while awaiting to see a provider.  This Clinical research associate was working on a different patient in assisting another provider with a more emergent manner and oncoming provider would be arriving in the clinic at 7 PM.  Patient was becoming belligerent and yelling security was advised to go to the assessment room and talk with patient to advise that she would be evaluated on next shift if she refused to remain calm or refused to wait to see the provider but was advised that patient can opt to sign an AMA and leave.  Patient has been evaluated by a behavioral health psychiatric nurse practitioner in the emergency department this afternoon and psychiatrically cleared patient subsequently left the ER and presented to [facility].  There is significant history of homelessness.  After this writer left AMA patient was observed walking calmly speaking calmly on her cell phone while walking on the side of the building by this Clinical research associate.  Patient was in no distress she was smiling and appeared calm without any obvious abnormal or psychotic like behaviors.  Patient was dressed appropriately for the weather and in no way was behaving as if she was paranoid or responding to auditory hallucinations as she was while awaiting to be evaluated in the assessment room.

## 2024-05-05 NOTE — Discharge Instructions (Addendum)
-   Recommend continue current outpatient psychiatric medications --> Wellbutrin 150 mg 24-hour tablet by mouth every morning --> Hydroxyzine  25 mg by mouth 4 times daily as needed for anxiety --> Melatonin 6 mg by mouth nightly --> Mirtazapine 7.5 mg by mouth nightly --> Trazodone 100 mg by mouth nightly - Recommend consider outpatient substance abuse close follow-up from resources given upon discharge - Recommend shelter resources be given upon discharge - Recommend strict adherence to safety plan created today listed below -Recommend close outpatient follow-up with the patient's outpatient psychiatry office at [facility] - Recommend consider abstinence from illicit substances   Safety Plan Lisa Crosby will reach out to Mother, Ms. Germer, call 911 or call mobile crisis, or go to nearest emergency room if condition worsens or if suicidal thoughts become active Patients' will follow up with [facility] for outpatient psychiatric services (therapy/medication management). Patient will consider following up closely with substance abuse resources The suicide prevention education provided includes the following: Suicide risk factors Suicide prevention and interventions National Suicide Hotline telephone number [facility] assessment telephone number [facility] Emergency Assistance 911 [facility] and/or Residential Mobile Crisis Unit telephone number

## 2024-05-05 NOTE — ED Provider Notes (Signed)
 [facility] Emergency Department Provider Note MRN:  [id number]  Arrival date & time: 05/05/24     Chief Complaint   Mental Health Problem   History of Present Illness   Lisa Crosby is a 43 y.o. year-old female presents to the ED with chief complaint of auditory hallucinations and homicidal thoughts.  Patient called PCP this evening and reported that she was hearing voices and having a psychotic breakdown.  She reports that she is seeing people.  She reports that the voices and people are telling her that she should find and kill her ex-boyfriend.  She states that she has not been taking her mental health medications.  On my history, patient states that someone is after her daughter's, and states that someone wants to kill her daughters.  [employer] has confirmed that her daughters are safe..  History provided by patient.   Review of Systems  Pertinent positive and negative review of systems noted in HPI.    Physical Exam   Vitals:   05/04/24 1958 05/04/24 2307  BP: (!) 155/100 (!) 169/109  Pulse: (!) 110 (!) 104  Resp: 20 20  Temp: 99.1 F (37.3 C) 99.1 F (37.3 C)  SpO2: 90% 99%    CONSTITUTIONAL:  agitated-appearing, NAD NEURO:  Alert and oriented x 3, CN 3-12 grossly intact EYES:  eyes equal and reactive ENT/NECK:  Supple, no stridor  CARDIO:  Tachycardia, regular rhythm, appears well-perfused  PULM:  No respiratory distress, CTAB GI/GU:  non-distended,  MSK/SPINE:  No gross deformities, no edema, moves all extremities  SKIN:  no rash, atraumatic   *Additional and/or pertinent findings included in MDM below  Diagnostic and Interventional Summary    EKG Interpretation Date/Time:    Ventricular Rate:    PR Interval:    QRS Duration:    QT Interval:    QTC Calculation:   R Axis:      Text Interpretation:         Labs Reviewed  COMPREHENSIVE METABOLIC PANEL WITH GFR - Abnormal; Notable for the following components:      Result  Value   Potassium 3.1 (*)    CO2 21 (*)    Glucose, Bld 102 (*)    All other components within normal limits  ETHANOL  CBC WITH DIFFERENTIAL/PLATELET  HCG, SERUM, QUALITATIVE  RAPID URINE DRUG SCREEN, HOSP PERFORMED    No orders to display    Medications  ziprasidone (GEODON) injection 20 mg (has no administration in time range)  sterile water (preservative free) injection (has no administration in time range)  potassium chloride  SA (KLOR-CON  M) CR tablet 40 mEq (has no administration in time range)     Procedures  /  Critical Care .Critical Care  Performed by: Sherel Dikes, PA-C Authorized by: Sherel Dikes, PA-C   Critical care provider statement:    Critical care time (minutes):  41   Critical care was necessary to treat or prevent imminent or life-threatening deterioration of the following conditions: chemical restraints, psychiatric condition.   Critical care was time spent personally by me on the following activities:  Development of treatment plan with patient or surrogate, discussions with consultants, evaluation of patient's response to treatment, examination of patient, ordering and review of laboratory studies, ordering and review of radiographic studies, ordering and performing treatments and interventions, pulse oximetry, re-evaluation of patient's condition and review of old charts   ED Course and Medical Decision Making  I have reviewed the triage vital signs, the nursing  notes, and pertinent available records from the EMR.  Social Determinants Affecting Complexity of Care: Patient has no clinically significant social determinants affecting this chief complaint..   ED Course: Clinical Course as of 05/05/24 0310  Wed May 05, 2024  0309 Potassium is little low at 3.1, will supplement [RB]  0309 CBC with Diff No leukocytosis or anemia [RB]  0310 hCG, serum, qualitative Pregnancy test negative [RB]  0310 Ethanol Ethanol is normal [RB]  0310 Given that  patient's child cannot remain with her due to her being IVC need.  Child is taken to the pediatric emergency department to await CPS for safe disposition. [RB]    Clinical Course User Index [RB] Sherel Dikes, PA-C    Medical Decision Making Patient here for auditory hallucinations and homicidal thoughts.  She called her doctor and reported that she is having a psychotic breakdown.  She reports hearing voices and seeing people.  Reports that the voices are telling her to kill her ex-boyfriend.  There is a note documented by Dr. Annabell Key with family practice.  The patient placed under IVC.    Situation is complicated by the fact that she is here with her daughter, who is a minor.  May need to involve social work to find out safe disposition for the daughter.  Amount and/or Complexity of Data Reviewed Labs: ordered.  Risk Prescription drug management.         Consultants: TTS consult pending   Treatment and Plan: Dispo per TTS.    Final Clinical Impressions(s) / ED Diagnoses     ICD-10-CM   1. Auditory hallucinations  R44.0     2. Homicidal thoughts  R45.850       ED Discharge Orders     None         Discharge Instructions Discussed with and Provided to Patient:   Discharge Instructions   None      Sherel Dikes, PA-C 05/05/24 0310    Ballard Bongo, MD 05/05/24 [phone]

## 2024-05-06 ENCOUNTER — Inpatient Hospital Stay (HOSPITAL_COMMUNITY)
Admission: AD | Admit: 2024-05-06 | Discharge: 2024-05-14 | DRG: 885 | Disposition: A | Discharge status: Home / Self Care | Payer: MEDICAID | Source: Intra-hospital | Attending: Psychiatry | Admitting: Psychiatry

## 2024-05-06 ENCOUNTER — Encounter (HOSPITAL_COMMUNITY): Payer: Self-pay | Admitting: Psychiatry

## 2024-05-06 DIAGNOSIS — F1520 Other stimulant dependence, uncomplicated: Secondary | ICD-10-CM | POA: Diagnosis present

## 2024-05-06 DIAGNOSIS — G4700 Insomnia, unspecified: Secondary | ICD-10-CM | POA: Diagnosis present

## 2024-05-06 DIAGNOSIS — Z8249 Family history of ischemic heart disease and other diseases of the circulatory system: Secondary | ICD-10-CM

## 2024-05-06 DIAGNOSIS — F17213 Nicotine dependence, cigarettes, with withdrawal: Secondary | ICD-10-CM | POA: Diagnosis present

## 2024-05-06 DIAGNOSIS — Z5900 Homelessness unspecified: Secondary | ICD-10-CM | POA: Diagnosis not present

## 2024-05-06 DIAGNOSIS — F15229 Other stimulant dependence with intoxication, unspecified: Secondary | ICD-10-CM | POA: Diagnosis present

## 2024-05-06 DIAGNOSIS — I10 Essential (primary) hypertension: Secondary | ICD-10-CM | POA: Diagnosis present

## 2024-05-06 DIAGNOSIS — Z6834 Body mass index (BMI) 34.0-34.9, adult: Secondary | ICD-10-CM | POA: Diagnosis not present

## 2024-05-06 DIAGNOSIS — Z91128 Patient's intentional underdosing of medication regimen for other reason: Secondary | ICD-10-CM | POA: Diagnosis not present

## 2024-05-06 DIAGNOSIS — Z87440 Personal history of urinary (tract) infections: Secondary | ICD-10-CM | POA: Diagnosis not present

## 2024-05-06 DIAGNOSIS — F19959 Other psychoactive substance use, unspecified with psychoactive substance-induced psychotic disorder, unspecified: Principal | ICD-10-CM | POA: Diagnosis present

## 2024-05-06 DIAGNOSIS — F200 Paranoid schizophrenia: Principal | ICD-10-CM | POA: Insufficient documentation

## 2024-05-06 DIAGNOSIS — E669 Obesity, unspecified: Secondary | ICD-10-CM | POA: Diagnosis present

## 2024-05-06 DIAGNOSIS — F15951 Other stimulant use, unspecified with stimulant-induced psychotic disorder with hallucinations: Secondary | ICD-10-CM | POA: Diagnosis present

## 2024-05-06 DIAGNOSIS — F333 Major depressive disorder, recurrent, severe with psychotic symptoms: Secondary | ICD-10-CM | POA: Diagnosis present

## 2024-05-06 DIAGNOSIS — F15251 Other stimulant dependence with stimulant-induced psychotic disorder with hallucinations: Secondary | ICD-10-CM | POA: Diagnosis present

## 2024-05-06 DIAGNOSIS — R45850 Homicidal ideations: Secondary | ICD-10-CM | POA: Diagnosis present

## 2024-05-06 DIAGNOSIS — Z91410 Personal history of adult physical and sexual abuse: Secondary | ICD-10-CM

## 2024-05-06 DIAGNOSIS — R440 Auditory hallucinations: Secondary | ICD-10-CM | POA: Diagnosis not present

## 2024-05-06 LAB — COMPREHENSIVE METABOLIC PANEL WITH GFR
ALT: 16 U/L (ref 0–44)
AST: 18 U/L (ref 15–41)
Albumin: 3.2 g/dL — ABNORMAL LOW (ref 3.5–5.0)
Alkaline Phosphatase: 80 U/L (ref 38–126)
Anion gap: 8 (ref 5–15)
BUN: 11 mg/dL (ref 6–20)
CO2: 22 mmol/L (ref 22–32)
Calcium: 8.8 mg/dL — ABNORMAL LOW (ref 8.9–10.3)
Chloride: 106 mmol/L (ref 98–111)
Creatinine, Ser: 0.83 mg/dL (ref 0.44–1.00)
GFR, Estimated: >60 mL/min (ref >60)
Glucose, Bld: 99 mg/dL (ref 70–99)
Potassium: 3 mmol/L — ABNORMAL LOW (ref 3.5–5.1)
Sodium: 136 mmol/L (ref 135–145)
Total Bilirubin: <0.2 mg/dL (ref 0.0–1.2)
Total Protein: 6.5 g/dL (ref 6.5–8.1)

## 2024-05-06 LAB — RAPID URINE DRUG SCREEN, HOSP PERFORMED
Amphetamines: POSITIVE — AB
Barbiturates: NOT DETECTED
Benzodiazepines: NOT DETECTED
Cocaine: NOT DETECTED
Opiates: NOT DETECTED
Tetrahydrocannabinol: NOT DETECTED

## 2024-05-06 LAB — HCG, SERUM, QUALITATIVE: Preg, Serum: NEGATIVE

## 2024-05-06 LAB — I-STAT CHEM 8, ED
BUN: 7 mg/dL (ref 6–20)
Calcium, Ion: 1.17 mmol/L (ref 1.15–1.40)
Chloride: 106 mmol/L (ref 98–111)
Creatinine, Ser: 0.8 mg/dL (ref 0.44–1.00)
Glucose, Bld: 85 mg/dL (ref 70–99)
HCT: 36 % (ref 36.0–46.0)
Hemoglobin: 12.2 g/dL (ref 12.0–15.0)
Potassium: 3.9 mmol/L (ref 3.5–5.1)
Sodium: 141 mmol/L (ref 135–145)
TCO2: 24 mmol/L (ref 22–32)

## 2024-05-06 LAB — ETHANOL: Alcohol, Ethyl (B): <15 mg/dL (ref <15)

## 2024-05-06 MED ORDER — LORAZEPAM 1 MG PO TABS
2.0000 mg | ORAL_TABLET | Freq: Four times a day (QID) | ORAL | Status: DC | PRN
  Administered 2024-05-06: 2 mg via ORAL
  Filled 2024-05-06: qty 2

## 2024-05-06 MED ORDER — HALOPERIDOL 5 MG PO TABS
5.0000 mg | ORAL_TABLET | Freq: Four times a day (QID) | ORAL | Status: DC | PRN
  Administered 2024-05-06: 5 mg via ORAL
  Filled 2024-05-06: qty 1

## 2024-05-06 MED ORDER — TRAZODONE HCL 100 MG PO TABS
100.0000 mg | ORAL_TABLET | Freq: Every evening | ORAL | Status: DC | PRN
  Administered 2024-05-07 – 2024-05-13 (×4): 100 mg via ORAL
  Filled 2024-05-06 (×5): qty 1

## 2024-05-06 MED ORDER — HALOPERIDOL LACTATE 5 MG/ML IJ SOLN: 5.0000 mg | Freq: Four times a day (QID) | INTRAMUSCULAR | Status: DC | PRN

## 2024-05-06 MED ORDER — DIPHENHYDRAMINE HCL 50 MG/ML IJ SOLN: 50.0000 mg | Freq: Four times a day (QID) | INTRAMUSCULAR | Status: DC | PRN

## 2024-05-06 MED ORDER — HALOPERIDOL LACTATE 5 MG/ML IJ SOLN: 10.0000 mg | Freq: Three times a day (TID) | INTRAMUSCULAR | Status: DC | PRN

## 2024-05-06 MED ORDER — LORAZEPAM 2 MG/ML IJ SOLN
2.0000 mg | Freq: Three times a day (TID) | INTRAMUSCULAR | Status: DC | PRN
  Administered 2024-05-09: 2 mg via INTRAMUSCULAR
  Filled 2024-05-06: qty 1

## 2024-05-06 MED ORDER — NICOTINE 21 MG/24HR TD PT24
21.0000 mg | MEDICATED_PATCH | Freq: Every day | TRANSDERMAL | Status: DC
  Filled 2024-05-06: qty 1

## 2024-05-06 MED ORDER — POTASSIUM CHLORIDE CRYS ER 20 MEQ PO TBCR
40.0000 meq | EXTENDED_RELEASE_TABLET | Freq: Once | ORAL | Status: AC
  Administered 2024-05-06: 40 meq via ORAL
  Filled 2024-05-06: qty 2

## 2024-05-06 MED ORDER — ACETAMINOPHEN 325 MG PO TABS: 650.0000 mg | ORAL_TABLET | Freq: Four times a day (QID) | ORAL | Status: DC | PRN

## 2024-05-06 MED ORDER — DIPHENHYDRAMINE HCL 50 MG/ML IJ SOLN
50.0000 mg | Freq: Three times a day (TID) | INTRAMUSCULAR | Status: DC | PRN
  Administered 2024-05-09: 50 mg via INTRAMUSCULAR
  Filled 2024-05-06: qty 1

## 2024-05-06 MED ORDER — HALOPERIDOL LACTATE 5 MG/ML IJ SOLN
5.0000 mg | Freq: Three times a day (TID) | INTRAMUSCULAR | Status: DC | PRN
  Administered 2024-05-09: 5 mg via INTRAMUSCULAR
  Filled 2024-05-06: qty 1

## 2024-05-06 MED ORDER — HALOPERIDOL 5 MG PO TABS
5.0000 mg | ORAL_TABLET | Freq: Three times a day (TID) | ORAL | Status: DC | PRN
  Administered 2024-05-08 – 2024-05-12 (×6): 5 mg via ORAL
  Filled 2024-05-06 (×6): qty 1

## 2024-05-06 MED ORDER — DIPHENHYDRAMINE HCL 50 MG/ML IJ SOLN: 50.0000 mg | Freq: Three times a day (TID) | INTRAMUSCULAR | Status: DC | PRN

## 2024-05-06 MED ORDER — DIPHENHYDRAMINE HCL 25 MG PO CAPS
50.0000 mg | ORAL_CAPSULE | Freq: Four times a day (QID) | ORAL | Status: DC | PRN
  Administered 2024-05-06: 50 mg via ORAL
  Filled 2024-05-06: qty 2

## 2024-05-06 MED ORDER — LORAZEPAM 2 MG/ML IJ SOLN: 2.0000 mg | Freq: Three times a day (TID) | INTRAMUSCULAR | Status: DC | PRN

## 2024-05-06 MED ORDER — ALUM & MAG HYDROXIDE-SIMETH 200-200-20 MG/5ML PO SUSP: 30.0000 mL | ORAL | Status: DC | PRN

## 2024-05-06 MED ORDER — DIPHENHYDRAMINE HCL 25 MG PO CAPS
50.0000 mg | ORAL_CAPSULE | Freq: Three times a day (TID) | ORAL | Status: DC | PRN
  Administered 2024-05-08 – 2024-05-12 (×6): 50 mg via ORAL
  Filled 2024-05-06 (×6): qty 2

## 2024-05-06 MED ORDER — MAGNESIUM HYDROXIDE 400 MG/5ML PO SUSP
30.0000 mL | Freq: Every day | ORAL | Status: DC | PRN
  Filled 2024-05-06: qty 30

## 2024-05-06 MED ORDER — LORAZEPAM 2 MG/ML IJ SOLN: 2.0000 mg | Freq: Four times a day (QID) | INTRAMUSCULAR | Status: DC | PRN

## 2024-05-06 NOTE — Progress Notes (Signed)
 Admission Note: Patient is a 43 years old female admitted to the unit from [facility] under IVC for medication noncompliance, substance abuse, command auditory hallucination telling her to hurt others. "Voices telling me to kill my ex-boyfriend and his family." Patient presents with a flat affect and depressed.  Patient appears sedated during assessment.  Admission plan of care reviewed, consent signed.  Stated goal is to stay away from drugs and stay strong mentally.  Skin and personal belongings completed.  Skin is dry and intact.  No contraband found.  Patient oriented to the unit, staff and room.  Routine safety checks initiated.  Patient is safe on the unit.

## 2024-05-06 NOTE — ED Provider Notes (Signed)
  Physical Exam  BP (!) 140/103 (BP Location: Left Arm)   Pulse 91   Temp 98.5 F (36.9 C) (Oral)   Resp 20   Ht 5\' 4"  (1.626 m)   Wt 98.4 kg   SpO2 100%   BMI 37.24 kg/m   Physical Exam  Procedures  Procedures  ED Course / MDM    Medical Decision Making Amount and/or Complexity of Data Reviewed Labs: ordered.  Risk Prescription drug management.   Patient under IVC, care assumed from previous provider. See his note for full details.  Plan to follow labs to ensure patient is medically clear for TTS.  Labs significant for mild hypokalemia. Oral potassium ordered.   Patient medically cleared at this time.        Lisa Crosby 05/06/24 0031    Lindle Rhea, MD 05/07/24 [phone]

## 2024-05-06 NOTE — Group Note (Signed)
 Date:  05/06/2024 Time:  8:40 PM  Group Topic/Focus:  Wrap-Up Group:   The focus of this group is to help patients review their daily goal of treatment and discuss progress on daily workbooks.    Participation Level:  Did Not Attend  Participation Quality:  Did Not Attend  Affect:  Did Not Attend  Cognitive:  Did Not Attend   Insight: None  Engagement in Group:  Did Not Attend  Modes of Intervention:  Did Not Attend  Additional Comments:  Pt was encouraged to attend wrap up group but did not attend.  Dwaine Gip 05/06/2024, 8:40 PM

## 2024-05-06 NOTE — ED Notes (Addendum)
 Pt arrived to purple zone with sitter.pt showered and changed into scrubs. Pt hearing voices and having hallucinations. Pt requesting to call sister. Attempted to call, sister did not answer. Pt returned to room.   Pt belongings removed, black dress, head scarf, and black sandals taken from the pt.

## 2024-05-06 NOTE — ED Notes (Signed)
 Pt talking with the TTS at this time.

## 2024-05-06 NOTE — ED Notes (Signed)
 Pt refusing EKG at this time

## 2024-05-06 NOTE — ED Notes (Signed)
Sandwich provided to the pt 

## 2024-05-06 NOTE — BH Assessment (Signed)
 Comprehensive Clinical Assessment (CCA) Note   05/06/2024 Lisa Crosby [id number]  Disposition: [name], NP recommends inpatient hospitalization.   The patient demonstrates the following risk factors for suicide: Chronic risk factors for suicide include: substance use disorder. Acute risk factors for suicide include: unemployment. Protective factors for this patient include: positive social support. Considering these factors, the overall suicide risk at this point appears to be low. Patient is not appropriate for outpatient follow up.   Per EDP's note: " Pt is a 43 y.o. female history of cocaine use, meth use, hallucinations secondary to medications presented for homicidal ideation along with auditory hallucinations.  Patient was seen earlier last night and ultimately discharged at noon today as they determined that her symptoms are secondary to her meth use that she used yesterday.  Patient states that she has thoughts of hurting others and when I ask her about this she states "the roads will run rampant with blood in [place] if my kids are harmed in any way shape or form" and patient states that she can foresee the future and has precognition and can foresee her children being hurt by an unknown assailant.  Patient was brought in for psych eval."    Upon evaluation with this clinician, the patient is alert, oriented x 3, and cooperative. Speech is pressured with difficulty to stay on topic. Pt appears casual. Eye contact is fair. Mood is anxious and affect is congruent with mood. The thought process is logical and thought content is coherent. Pt endorses SI without a the plan. Pt states that she is tired of the voices. Per chart, pt made a comment that if the voices don't stop she would end her life. Pt admitted to making that statement. Pt denies HI. Pt reports auditory hallucinations but is unable to recall what the voices are saying to her. There is no indication that the patient is  responding to internal stimuli. No delusions elicited during this assessment. Pt reports meth use. Pt reports last use on Tuesday (5/27).   Chief Complaint:  Chief Complaint  Patient presents with   Psychiatric Evaluation   Visit Diagnosis:    Substance induced psychosis  CCA Screening, Triage and Referral (STR)  Patient Reported Information How did you hear about us ? -- [facility] ED)  What Is the Reason for Your Visit/Call Today? Per EDP's note: " Pt is a 43 y.o. female history of cocaine use, meth use, hallucinations secondary to medications presented for homicidal ideation along with auditory hallucinations.  Patient was seen earlier last night and ultimately discharged at noon today as they determined that her symptoms are secondary to her meth use that she used yesterday.  Patient states that she has thoughts of hurting others and when I ask her about this she states "the roads will run rampant with blood in [place] if my kids are harmed in any way shape or form" and patient states that she can foresee the future and has precognition and can foresee her children being hurt by an unknown assailant.  Patient was brought in for psych eval."  How Long Has This Been Causing You Problems? > than 6 months  What Do You Feel Would Help You the Most Today? Treatment for Depression or other mood problem; Stress Management; Medication(s)   Have You Recently Had Any Thoughts About Hurting Yourself? Yes  Are You Planning to Commit Suicide/Harm Yourself At This time? No   Flowsheet Row ED from 05/05/2024 in [facility] Emergency Department at [facility]  [facility] Most recent reading at 05/05/2024  9:29 PM ED from 05/05/2024 in [facility] Most recent reading at 05/05/2024  6:13 PM ED from 03/10/2024 in [facility] Emergency Department at [facility] Most recent reading at 03/10/2024  1:07 AM  C-SSRS RISK CATEGORY No Risk No Risk High Risk       Have you Recently  Had Thoughts About Hurting Someone Marigene Shoulder? No  Are You Planning to Harm Someone at This Time? No  Explanation: Denies HI   Have You Used Any Alcohol or Drugs in the Past 24 Hours? No  How Long Ago Did You Use Drugs or Alcohol? N/a What Did You Use and How Much? n/a   Do You Currently Have a Therapist/Psychiatrist? No  Name of Therapist/Psychiatrist:    Have You Been Recently Discharged From Any Office Practice or Programs? No  Explanation of Discharge From Practice/Program: n/a    CCA Screening Triage Referral Assessment Type of Contact: Tele-Assessment  Telemedicine Service Delivery: Telemedicine service delivery: This service was provided via telemedicine using a 2-way, interactive audio and video technology  Is this Initial or Reassessment? Is this Initial or Reassessment?: Initial Assessment  Date Telepsych consult ordered in [facility]:  Date Telepsych consult ordered in [facility]: 05/05/24  Time Telepsych consult ordered in [facility]:  Time Telepsych consult ordered in [facility]: 2159  Location of Assessment: [facility] ED  Provider Location: [facility] Assessment Services   Collateral Involvement: none   Does Patient Have a Automotive engineer Guardian? No  Legal Guardian Contact Information: n/a  Copy of Legal Guardianship Form: -- (n/a)  Legal Guardian Notified of Arrival: -- (n/a)  Legal Guardian Notified of Pending Discharge: -- (n/a)  If Minor and Not Living with Parent(s), Who has Custody? n/a  Is CPS involved or ever been involved? Never  Is APS involved or ever been involved? Never   Patient Determined To Be At Risk for Harm To Self or Others Based on Review of Patient Reported Information or Presenting Complaint? Yes, for Self-Harm  Method: No Plan  Availability of Means: no Intent: Vague intent or NA  Notification Required: No need or identified person  Additional Information for Danger to Others Potential: -- (n/a)  Additional Comments for Danger to Others Potential:  n/a  Are There Guns or Other Weapons in Your Home? No  Types of Guns/Weapons: Denies access to guns  Are These Weapons Safely Secured?                            No  Who Could Verify You Are Able To Have These Secured: n/a  Do You Have any Outstanding Charges, Pending Court Dates, Parole/Probation? Pt denies pending charges  Contacted To Inform of Risk of Harm To Self or Others: -- (n/a)    Does Patient Present under Involuntary Commitment? Yes    [place] of Residence: [place]   Patient Currently Receiving the Following Services: Not Receiving Services   Determination of Need: Urgent (48 hours)   Options For Referral: Inpatient Hospitalization     CCA Biopsychosocial Patient Reported Schizophrenia/Schizoaffective Diagnosis in Past: No   Strengths: Able to express feelings and emotions   Mental Health Symptoms Depression:  None   Duration of Depressive symptoms:    Mania:  None   Anxiety:   None   Psychosis:  Hallucinations   Duration of Psychotic symptoms: Duration of Psychotic Symptoms: Greater than six months   Trauma:  None   Obsessions:  None   Compulsions:  None   Inattention:  None   Hyperactivity/Impulsivity:  None   Oppositional/Defiant Behaviors:  None   Emotional Irregularity:  Mood lability   Other Mood/Personality Symptoms:  none    Mental Status Exam Appearance and self-care  Stature:  Average   Weight:  Average weight   Clothing:  -- (n/a)   Grooming:  Normal   Cosmetic use:  None   Posture/gait:  Normal   Motor activity:  Not Remarkable   Sensorium  Attention:  Normal   Concentration:  Normal   Orientation:  X5   Recall/memory:  Normal   Affect and Mood  Affect:  Labile   Mood:  Anxious   Relating  Eye contact:  Normal   Facial expression:  Anxious; Responsive   Attitude toward examiner:  Cooperative   Thought and Language  Speech flow: Pressured; Flight of Ideas   Thought content:   Appropriate to Mood and Circumstances   Preoccupation:  None   Hallucinations:  Auditory   Organization:  Patent examiner of Knowledge:  Average   Intelligence:  Average   Abstraction:  Normal   Judgement:  Fair   Dance movement psychotherapist:  Adequate   Insight:  Fair   Decision Making:  Impulsive   Social Functioning  Social Maturity:  Impulsive   Social Judgement:  Heedless   Stress  Stressors:  Relationship; Housing   Coping Ability:  Overwhelmed; Exhausted   Skill Deficits:  Communication; Decision making   Supports:  Support needed     Religion: Religion/Spirituality Are You A Religious Person?: No How Might This Affect Treatment?: n/a  Leisure/Recreation: Leisure / Recreation Do You Have Hobbies?: No  Exercise/Diet: Exercise/Diet Do You Exercise?: No Have You Gained or Lost A Significant Amount of Weight in the Past Six Months?: No Do You Follow a Special Diet?: No Do You Have Any Trouble Sleeping?: Yes Explanation of Sleeping Difficulties: Pt reports that it is difficult for her to sleep due to the voices   CCA Employment/Education Employment/Work Situation: Employment / Work Situation Employment Situation: Unemployed Patient's Job has Been Impacted by Current Illness: No Has Patient ever Been in Equities trader?: No  Education: Education Is Patient Currently Attending School?: No Last Grade Completed: 12 Did You Product manager?: No Did You Have An Individualized Education Program (IIEP): No Did You Have Any Difficulty At [employer]?: No Patient's Education Has Been Impacted by Current Illness: No   CCA Family/Childhood History Family and Relationship History: Family history Marital status: Single Does patient have children?:  (UTA)  Childhood History:  Childhood History By whom was/is the patient raised?: Mother Did patient suffer any verbal/emotional/physical/sexual abuse as a child?: No Did patient suffer from severe  childhood neglect?: No Has patient ever been sexually abused/assaulted/raped as an adolescent or adult?: No Was the patient ever a victim of a crime or a disaster?: No Witnessed domestic violence?: No Has patient been affected by domestic violence as an adult?: No       CCA Substance Use Alcohol/Drug Use: Alcohol / Drug Use Pain Medications: See MAR Prescriptions: See MAR Over the Counter: See MAR History of alcohol / drug use?: Yes Longest period of sobriety (when/how long): Unknown Negative Consequences of Use: Personal relationships, Financial Withdrawal Symptoms: None Substance #1 Name of Substance 1: Meth 1 - Age of First Use: 42 1 - Amount (size/oz): unknown 1 - Frequency: 4-5 times a week 1 - Last  Use / Amount: Tuesday 05/04/24                       ASAM's:  Six Dimensions of Multidimensional Assessment  Dimension 1:  Acute Intoxication and/or Withdrawal Potential:      Dimension 2:  Biomedical Conditions and Complications:      Dimension 3:  Emotional, Behavioral, or Cognitive Conditions and Complications:     Dimension 4:  Readiness to Change:     Dimension 5:  Relapse, Continued use, or Continued Problem Potential:     Dimension 6:  Recovery/Living Environment:     ASAM Severity Score:    ASAM Recommended Level of Treatment: ASAM Recommended Level of Treatment: Level II Intensive Outpatient Treatment   Substance use Disorder (SUD) Substance Use Disorder (SUD)  Checklist Symptoms of Substance Use: Continued use despite having a persistent/recurrent physical/psychological problem caused/exacerbated by use, Continued use despite persistent or recurrent social, interpersonal problems, caused or exacerbated by use  Recommendations for Services/Supports/Treatments: Recommendations for Services/Supports/Treatments Recommendations For Services/Supports/Treatments: Inpatient Hospitalization  Disposition Recommendation per psychiatric provider: We recommend  inpatient psychiatric hospitalization after medical hospitalization. Patient has been involuntarily committed on 05/05/24.    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) 05/05/2024   Cocaine use disorder, severe, dependence (HCC) 05/05/2024   Methamphetamine use disorder, severe (HCC) 05/05/2024   Malingering 03/10/2024   Amphetamine abuse (HCC) 03/10/2024   Cocaine abuse (HCC) 03/10/2024   Persistent adjustment disorder with mixed anxiety and depressed mood 11/26/2023   Hypersexuality 07/14/2023   Sore throat 07/14/2023   Weight gain due to medication 07/14/2023   ASCUS with positive high risk HPV cervical 12/14/2020   Nabothian cyst 12/14/2020   Cervical cancer screening 11/27/2020   Abscess of axilla, right 01/13/2019   Abdominal pain, epigastric 04/10/2018   Attention deficit 01/07/2017   Screen for STD (sexually transmitted disease) 12/29/2015   Hemorrhoids, external 07/06/2015   Generalized anxiety disorder 02/26/2013   Nexplanon  in place 09/18/2012   Obesity 01/06/2009   TOBACCO ABUSE 01/06/2009   Essential hypertension, benign 01/06/2009     Referrals to Alternative Service(s): Referred to Alternative Service(s):   Place:   Date:   Time:    Referred to Alternative Service(s):   Place:   Date:   Time:    Referred to Alternative Service(s):   Place:   Date:   Time:    Referred to Alternative Service(s):   Place:   Date:   Time:     [place], [place], [facility]

## 2024-05-06 NOTE — Plan of Care (Signed)
   Problem: Education: Goal: Knowledge of [facility] Education information/materials will improve Outcome: Progressing Goal: Mental status will improve Outcome: Progressing Goal: Verbalization of understanding the information provided will improve Outcome: Progressing   Problem: Education: Goal: Emotional status will improve Outcome: Not Progressing   Problem: Activity: Goal: Interest or engagement in activities will improve Outcome: Not Progressing

## 2024-05-06 NOTE — Tx Team (Signed)
 Initial Treatment Plan 05/06/2024 4:12 PM INAARA TYE VOZ:[id number]    PATIENT STRESSORS: Financial difficulties   Health problems   Medication change or noncompliance   Substance abuse     PATIENT STRENGTHS: Ability for insight  Communication skills    PATIENT IDENTIFIED PROBLEMS: "To stay away from drugs"  "To stay strong mentally"  Command auditory hallucination  Substance Abuse  Medication noncompliance  Placement issues  Ineffective coping skills         DISCHARGE CRITERIA:  Motivation to continue treatment in a less acute level of care Safe-care adequate arrangements made  PRELIMINARY DISCHARGE PLAN: Attend aftercare/continuing care group Outpatient therapy Placement in alternative living arrangements  PATIENT/FAMILY INVOLVEMENT: This treatment plan has been presented to and reviewed with the patient, AMONDA BRILLHART, and/or family member.  The patient and family have been given the opportunity to ask questions and make suggestions.  Curvin Downing, RN 05/06/2024, 4:12 PM

## 2024-05-06 NOTE — ED Notes (Signed)
 Pt belongings placed in locker 15; 3 bags

## 2024-05-06 NOTE — TOC Progression Note (Signed)
 Transition of Care [facility]) - Progression Note    Patient Details  Name: Lisa Crosby MRN: [id number] Date of Birth: 07-19-1981  [facility] CM/SW Contact  Valley Gavia, LCSWA Phone Number: 05/06/2024, 9:42 AM  Clinical Narrative:     CSW spoke with SW Bettylou Brunner with [facility] CPS, she confirms pt's children are safe. CSW updated SW Bessie Brook on current recommendation for inpatient, she states pt's mother is filing for emergency custody and states she (SW Bessie Brook) would like a call if pt's status changes and she is cleared for dc, SW Bessie Brook can be reached at [phone]. TOC will continue to follow.        Expected Discharge Plan and Services                                               Social Determinants of Health (SDOH) Interventions SDOH Screenings   Food Insecurity: Low Risk  (03/24/2024)   Received from [facility]  Housing: Low Risk  (03/24/2024)   Received from [facility]  Transportation Needs: No Transportation Needs (03/24/2024)   Received from [facility]  Utilities: Low Risk  (03/24/2024)   Received from [facility]  Depression (PHQ2-9): High Risk (11/25/2023)  Tobacco Use: High Risk (05/05/2024)    Readmission Risk Interventions     No data to display

## 2024-05-06 NOTE — ED Notes (Signed)
 IVC CURRENT EXP 6/5

## 2024-05-06 NOTE — ED Notes (Signed)
 IVC: ENVELOPE # [id number] CASE # [id number]  ORIGINAL IN RED FOLDER 3 COPIES ON CLIPBOARD IN PURPLE COPY IN MEDICAL RECORDS

## 2024-05-06 NOTE — Progress Notes (Signed)
   05/06/24 2200  Psych Admission Type (Psych Patients Only)  Admission Status Involuntary  Psychosocial Assessment  Patient Complaints Anxiety;Depression  Eye Contact Brief  Facial Expression Flat  Affect Appropriate to circumstance  Speech Logical/coherent  Interaction Assertive  Motor Activity Slow  Appearance/Hygiene Unremarkable  Behavior Characteristics Appropriate to situation  Mood Depressed  Thought Process  Coherency WDL  Content Paranoia  Delusions Paranoid  Perception WDL  Hallucination None reported or observed  Judgment Limited  Confusion None  Danger to Self  Current suicidal ideation? Denies  Danger to Others  Danger to Others None reported or observed

## 2024-05-07 ENCOUNTER — Encounter (HOSPITAL_COMMUNITY): Payer: Self-pay

## 2024-05-07 DIAGNOSIS — F333 Major depressive disorder, recurrent, severe with psychotic symptoms: Secondary | ICD-10-CM | POA: Diagnosis not present

## 2024-05-07 LAB — LIPID PANEL
Cholesterol: 115 mg/dL (ref 0–200)
HDL: 28 mg/dL — ABNORMAL LOW (ref >40)
LDL Cholesterol: 75 mg/dL (ref 0–99)
Total CHOL/HDL Ratio: 4.1 ratio
Triglycerides: 58 mg/dL (ref <150)
VLDL: 12 mg/dL (ref 0–40)

## 2024-05-07 LAB — HEMOGLOBIN A1C
Hgb A1c MFr Bld: 5 % (ref 4.8–5.6)
Mean Plasma Glucose: 96.8 mg/dL

## 2024-05-07 MED ORDER — MIRTAZAPINE 15 MG PO TABS
15.0000 mg | ORAL_TABLET | Freq: Every day | ORAL | Status: DC
  Administered 2024-05-07 – 2024-05-13 (×6): 15 mg via ORAL
  Filled 2024-05-07 (×6): qty 1

## 2024-05-07 MED ORDER — ARIPIPRAZOLE 5 MG PO TABS
5.0000 mg | ORAL_TABLET | Freq: Every day | ORAL | Status: DC
  Administered 2024-05-08: 5 mg via ORAL
  Filled 2024-05-07: qty 1

## 2024-05-07 MED ORDER — HYDROXYZINE HCL 50 MG PO TABS
50.0000 mg | ORAL_TABLET | Freq: Three times a day (TID) | ORAL | Status: DC | PRN
  Administered 2024-05-07 – 2024-05-08 (×2): 50 mg via ORAL
  Filled 2024-05-07 (×2): qty 1

## 2024-05-07 MED ORDER — ARIPIPRAZOLE 5 MG PO TABS: 5.0000 mg | ORAL_TABLET | Freq: Every day | ORAL | Status: DC

## 2024-05-07 MED ORDER — AMLODIPINE BESYLATE 5 MG PO TABS
10.0000 mg | ORAL_TABLET | Freq: Every day | ORAL | Status: DC
  Administered 2024-05-07 – 2024-05-14 (×8): 10 mg via ORAL
  Filled 2024-05-07 (×8): qty 2

## 2024-05-07 NOTE — BH IP Treatment Plan (Signed)
 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)   Methamphetamine use disorder, severe (HCC)   Psychoactive substance-induced psychosis (HCC)   Current Medications:  Current Facility-Administered Medications  Medication Dose Route Frequency Provider Last Rate Last Admin   acetaminophen  (TYLENOL ) tablet 650 mg  650 mg Oral Q6H PRN Volanda Gruber, NP       alum & mag hydroxide-simeth (MAALOX/MYLANTA) 200-200-20 MG/5ML suspension 30 mL  30 mL Oral Q4H PRN Volanda Gruber, NP       amLODipine  (NORVASC ) tablet 10 mg  10 mg Oral Daily Nwoko, Agnes I, NP   10 mg at 05/07/24 1711   [START ON 05/08/2024] ARIPiprazole  (ABILIFY ) tablet 5 mg  5 mg Oral Daily Nwoko, Agnes I, NP       haloperidol  (HALDOL ) tablet 5 mg  5 mg Oral TID PRN Volanda Gruber, NP       And   diphenhydrAMINE  (BENADRYL ) capsule 50 mg  50 mg Oral TID PRN Volanda Gruber, NP       haloperidol  lactate (HALDOL ) injection 5 mg  5 mg Intramuscular TID PRN Volanda Gruber, NP       And   diphenhydrAMINE  (BENADRYL ) injection 50 mg  50 mg Intramuscular TID PRN Volanda Gruber, NP       And   LORazepam  (ATIVAN ) injection 2 mg  2 mg Intramuscular TID PRN Volanda Gruber, NP       haloperidol  lactate (HALDOL ) injection 10 mg  10 mg Intramuscular TID PRN Volanda Gruber, NP       And   diphenhydrAMINE  (BENADRYL ) injection 50 mg  50 mg Intramuscular TID PRN Volanda Gruber, NP       And   LORazepam  (ATIVAN ) injection 2 mg  2 mg Intramuscular TID PRN Volanda Gruber, NP       hydrOXYzine  (ATARAX ) tablet 50 mg  50 mg Oral TID PRN Asuncion Layer I,  NP       magnesium  hydroxide (MILK OF MAGNESIA) suspension 30 mL  30 mL Oral Daily PRN Volanda Gruber, NP       mirtazapine (REMERON) tablet 15 mg  15 mg Oral QHS Nwoko, Agnes I, NP       nicotine  (NICODERM CQ  - dosed in mg/24 hours) patch 21 mg  21 mg Transdermal Q0600 Volanda Gruber, NP       traZODone (DESYREL) tablet 100 mg  100 mg Oral QHS PRN Volanda Gruber, NP       PTA Medications: No medications prior to admission.    Patient Stressors: Financial difficulties   Health problems   Medication change or noncompliance   Substance abuse    Patient Strengths: Ability for insight  Communication skills   Treatment Modalities: Medication Management, Group therapy, Case management,  1 to 1 session with clinician, Psychoeducation, Recreational therapy.   Physician Treatment Plan for Primary Diagnosis: MDD (major depressive disorder), recurrent, severe, with psychosis (HCC) Long Term Goal(s): Improvement in symptoms so as ready for discharge   Short Term Goals: Ability to identify changes in lifestyle to reduce recurrence of condition will improve Ability to verbalize feelings will improve Ability to disclose and discuss suicidal ideas  Ability to demonstrate self-control will improve  Medication Management: Evaluate patient's response, side effects, and tolerance of medication regimen.  Therapeutic Interventions: 1 to 1 sessions, Unit Group sessions and Medication administration.  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  Therapeutic Interventions: 1 to 1 sessions, Unit Group sessions and Medication administration.  Evaluation of Outcomes: Not Progressing   RN Treatment Plan for Primary Diagnosis: MDD (major depressive disorder), recurrent, severe, with psychosis (HCC) Long Term Goal(s): Knowledge of disease and therapeutic regimen to maintain health will improve  Short Term Goals: Ability to remain free from injury will improve, Ability to verbalize frustration and anger appropriately will improve, Ability to demonstrate self-control, Ability to participate in decision making will improve, Ability to verbalize feelings will improve, Ability to disclose and discuss suicidal ideas, Ability to identify and develop effective coping behaviors will improve, and Compliance with prescribed medications will improve  Medication Management: RN will administer medications as ordered by provider, will assess and evaluate patient's response and provide education to patient for prescribed medication. RN will report any adverse and/or side effects to prescribing provider.  Therapeutic Interventions: 1 on 1 counseling sessions, Psychoeducation, Medication administration, Evaluate responses to treatment, Monitor vital signs and CBGs as ordered, Perform/monitor CIWA, COWS, AIMS and Fall Risk screenings as ordered, Perform wound care treatments as ordered.  Evaluation of Outcomes: Not Progressing   LCSW Treatment Plan for Primary Diagnosis: MDD (major depressive disorder), recurrent, severe, with psychosis (HCC) Long Term Goal(s): Safe transition to appropriate next level of care at discharge, Engage patient in  therapeutic group addressing interpersonal concerns.  Short Term Goals: Engage patient in aftercare planning with referrals and resources, Increase social support, Increase ability to appropriately verbalize feelings, Increase emotional regulation, Facilitate acceptance of mental health diagnosis and concerns, Facilitate patient progression through stages of change regarding substance use diagnoses and concerns, Identify triggers associated with mental health/substance abuse issues, and Increase skills for wellness and recovery  Therapeutic Interventions: Assess for all discharge needs, 1 to 1 time with Social worker, Explore available resources and support systems, Assess for adequacy in community support network, Educate family and significant other(s) on suicide prevention, Complete Psychosocial Assessment, Interpersonal group therapy.  Evaluation of Outcomes: Not Progressing   Progress in Treatment: Attending groups: No. Participating in groups: No. Taking medication as prescribed: Yes. Toleration medication: Yes. Family/Significant other contact made: yes, patient declined consents Patient understands diagnosis: No. Discussing patient identified problems/goals with staff: Yes. Medical problems stabilized or resolved: Yes. Denies suicidal/homicidal ideation: Yes. Issues/concerns per patient self-inventory: No.  New problem(s) identified: No  New Short Term/Long Term Goal(s):    medication stabilization, elimination of SI thoughts, development of comprehensive mental wellness plan.   Patient Goals:  "I want to get clean.  I've been using methamphetamine, and I'm ready to enter a 30-day inpatient treatment program."   Discharge Plan or  Barriers:  Patient recently admitted. CSW will continue to follow and assess for appropriate referrals and possible discharge planning.    Reason for Continuation of Hospitalization: Hallucinations Homicidal ideation Medication  stabilization  Estimated Length of Stay:  5 - 7 days  Last 3 [place] Suicide Severity Risk Score: Flowsheet Row [facility] (Current[facility] Most recent reading at 05/06/2024  3:27 PM ED from 05/05/2024 in [facility] Emergency Department [facility] Most recent reading at 05/06/2024 12:29 AM ED from 05/05/2024 in [facility] Most recent reading at 05/05/2024  6:13 PM  C-SSRS RISK CATEGORY No Risk Low Risk No Risk       Last PHQ 2/9 Scores:    11/25/2023    2:58 PM 07/14/2023    1:33 PM 04/07/2023    2:04 PM  Depression screen PHQ 2/9  Decreased Interest 3 3 0  Down, Depressed, Hopeless 3 3 0  PHQ - 2 Score 6 6 0  Altered sleeping 3 3 0  Tired, decreased energy 3 3 0  Change in appetite 3 3 0  Feeling bad or failure about yourself  3 3 0  Trouble concentrating 3 3 0  Moving slowly or fidgety/restless 3 0 0  Suicidal thoughts 0 0 0  PHQ-9 Score 24 21 0    Scribe for Treatment Team: Moyinoluwa Dawe O Jaquayla Hege, LCSWA 05/07/2024 6:30 PM

## 2024-05-07 NOTE — BHH Counselor (Signed)
 Adult Comprehensive Assessment  Patient ID: Lisa Crosby, female   DOB: [birth year], 43 y.o.   MRN: [id number]  Information Source: Information source: Patient  Current Stressors:  Patient states their primary concerns and needs for treatment are:: "hearing voices" Patient states their goals for this hospitilization and ongoing recovery are:: "getting clean" Educational / Learning stressors: Pt denies. Employment / Job issues: Pt denies. Family Relationships: Pt denies. Financial / Lack of resources (include bankruptcy): Pt denies. Housing / Lack of housing: Pt denies. Physical health (include injuries & life threatening diseases): "high blood pressure" Social relationships: Pt denies. Substance abuse: "meth" Bereavement / Loss: Pt denies.  Living/Environment/Situation:  Living Arrangements: Parent, Children Living conditions (as described by patient or guardian): WNL Who else lives in the home?: Mother, daughter How long has patient lived in current situation?: "for a while" What is atmosphere in current home: Comfortable, Loving, Supportive  Family History:  Marital status: Single Are you sexually active?: No What is your sexual orientation?: "Men" Has your sexual activity been affected by drugs, alcohol, medication, or emotional stress?: Pt denies. Does patient have children?: Yes How many children?: 1 How is patient's relationship with their children?: Pt reports that she has one 30 year old child who is with her mother.  Reports relationship is "amazing"  Childhood History:  By whom was/is the patient raised?: Both parents Description of patient's relationship with caregiver when they were a child: "amazing" Patient's description of current relationship with people who raised him/her: Pt reports that father is deceased.  Describes relationship with mother as "all right". How were you disciplined when you got in trouble as a child/adolescent?: "can't watch TV" Does patient  have siblings?: Yes Number of Siblings: 3 Description of patient's current relationship with siblings: "all right" Did patient suffer any verbal/emotional/physical/sexual abuse as a child?: No Did patient suffer from severe childhood neglect?: No Has patient ever been sexually abused/assaulted/raped as an adolescent or adult?: No Was the patient ever a victim of a crime or a disaster?: No Witnessed domestic violence?: No Has patient been affected by domestic violence as an adult?: No  Education:  Highest grade of school patient has completed: "high school" Currently a Consulting civil engineer?: No Learning disability?: No  Employment/Work Situation:   Employment Situation: Unemployed What is the Longest Time Patient has Held a Job?: "7 years" Where was the Patient Employed at that Time?: "[employer]" Has Patient ever Been in the [employer]?: No  Financial Resources:   Surveyor, quantity resources: [employer], Food stamps ("unemployment") Does patient have a Lawyer or guardian?: No  Alcohol/Substance Abuse:   What has been your use of drugs/alcohol within the last 12 months?: Meth: "4x a day for the past 6 months".  Pt unable to report on amount. If attempted suicide, did drugs/alcohol play a role in this?: No Alcohol/Substance Abuse Treatment Hx: Denies past history Has alcohol/substance abuse ever caused legal problems?: No  Social Support System:   Patient's Community Support System: None Describe Community Support System: Pt denies. Type of faith/religion: Pt denies. How does patient's faith help to cope with current illness?: Pt denies.  Leisure/Recreation:   Do You Have Hobbies?: No  Strengths/Needs:   What is the patient's perception of their strengths?: "Communication" Patient states they can use these personal strengths during their treatment to contribute to their recovery: Pt denies. Patient states these barriers may affect/interfere with their treatment: Pt denies. Patient states  these barriers may affect their return to the community: Pt denies.  Discharge Plan:  Currently receiving community mental health services: No Patient states concerns and preferences for aftercare planning are: Pt reports that she would like a referral for a 30 day treatment program. Patient states they will know when they are safe and ready for discharge when: "I'll know" Does patient have access to transportation?: Yes Does patient have financial barriers related to discharge medications?: No Will patient be returning to same living situation after discharge?: Yes  Summary/Recommendations:   Summary and Recommendations (to be completed by the evaluator): Patient is a 43 year old female [place] [facility] [place] Patient presents to the hospital for concerns of substance use, auditory hallucinations and homicidal ideations.  At admission patient stated "that she has thoughts of hurting others and when I ask her about this she states "the roads will run rampant with blood in [place] if my kids are harmed in any way shape or form" and patient states that she can foresee the future and has precognition and can foresee her children being hurt by an unknown assailant."  Patient reported to this writer that she has been using meth for six months. She reports that she uses minimum of four times a day.  Patient was unable to provide detail on the amount of meth being used.  She denied to this Clinical research associate any other substance use.  She did not identify to this writer any specific trigger to her current mental health state.  However, pt does report being negatively impacted by her lack of employment.  Recommendations include crisis stabilization, therapeutic milieu, encourage group attendance and participation, medication management for detox/mood stabilization and development of comprehensive mental wellness plan.  Lisa Crosby. 05/07/2024

## 2024-05-07 NOTE — Plan of Care (Signed)
  Problem: Education: Goal: Knowledge of [facility] Education information/materials will improve Outcome: Progressing Goal: Verbalization of understanding the information provided will improve Outcome: Progressing   Problem: Education: Goal: Emotional status will improve Outcome: Not Progressing Goal: Mental status will improve Outcome: Not Progressing   Problem: Activity: Goal: Interest or engagement in activities will improve Outcome: Not Progressing

## 2024-05-07 NOTE — Group Note (Signed)
 Date:  05/07/2024 Time:  8:29 PM  Group Topic/Focus:  Wrap-Up Group:   The focus of this group is to help patients review their daily goal of treatment and discuss progress on daily workbooks.    Participation Level:  Did Not Attend   Marlana Mckowen Dacosta 05/07/2024, 8:29 PM
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
## 2024-05-07 NOTE — Group Note (Signed)
 Recreation Therapy Group Note   Group Topic:Team Building  Group Date: 05/07/2024 Start Time: 1020 End Time: 1155 Facilitators: Criston Chancellor-McCall, LRT,CTRS Location: [address] Dayroom   Group Topic: Communication, Team Building, Problem Solving  Goal Area(s) Addresses:  Patient will effectively work with peer towards shared goal.  Patient will identify skills used to make activity successful.  Patient will identify how skills used during activity can be used to reach post d/c goals.   Behavioral Response:   Intervention: STEM Activity  Activity: Straw Bridge. In teams of 3-5, patients were given 15 plastic drinking straws and an equal length of masking tape. Using the materials provided, patients were instructed to build a free standing bridge-like structure to suspend an everyday item (ex: puzzle box) off of the floor or table surface. All materials were required to be used by the team in their design. LRT facilitated post-activity discussion reviewing team process. Patients were encouraged to reflect how the skills used in this activity can be generalized to daily life post discharge.   Education: Pharmacist, community, Scientist, physiological, Discharge Planning   Education Outcome: Acknowledges education/In group clarification offered/Needs additional education.    Affect/Mood: N/A   Participation Level: Did not attend    Clinical Observations/Individualized Feedback:     Plan: Continue to engage patient in RT group sessions 2-3x/week.   Eris Hannan-McCall, LRT,CTRS 05/07/2024 1:16 PM

## 2024-05-07 NOTE — Group Note (Signed)
 Date:  05/07/2024 Time:  9:01 PM  Group Topic/Focus:  Wrap-Up Group:   The focus of this group is to help patients review their daily goal of treatment and discuss progress on daily workbooks.    Additional Comments:  Pt was encouraged, but opted out of attending the AA meeting this evening.   Lisa Crosby 05/07/2024, 9:01 PM

## 2024-05-07 NOTE — Progress Notes (Addendum)
 Conversation with patient:  CSW gave patient a list of substance use treatment programs.     Lisa Crosby, LCSWA 05/07/2024

## 2024-05-07 NOTE — Progress Notes (Signed)
   05/07/24 2200  Psych Admission Type (Psych Patients Only)  Admission Status Involuntary  Psychosocial Assessment  Patient Complaints Anxiety;Crying spells;Depression;Irritability  Eye Contact Brief  Facial Expression Flat  Affect Appropriate to circumstance  Speech Logical/coherent  Interaction Assertive  Motor Activity Slow  Appearance/Hygiene Unremarkable  Behavior Characteristics Appropriate to situation  Mood Anxious;Depressed;Despair;Sad;Sullen;Terrified  Thought Process  Coherency WDL  Content Paranoia  Delusions Paranoid  Perception WDL  Hallucination None reported or observed  Judgment Limited  Confusion None  Danger to Self  Current suicidal ideation? Denies  Danger to Others  Danger to Others None reported or observed

## 2024-05-07 NOTE — [facility] Suicide Risk Assessment (Signed)
 [facility] INPATIENT:  Family/Significant Other Suicide Prevention Education  Suicide Prevention Education:  Patient Refusal for Family/Significant Other Suicide Prevention Education: The patient Lisa Crosby has refused to provide written consent for family/significant other to be provided Family/Significant Other Suicide Prevention Education during admission and/or prior to discharge.  Physician notified. SPE completed with pt, as pt refused to consent to family contact. SPI pamphlet provided to pt and pt was encouraged to share information with support network, ask questions, and talk about any concerns relating to SPE. Pt denies access to guns/firearms and verbalized understanding of information provided. Mobile Crisis information also provided to pt.    Larri Ply 05/07/2024, 2:43 PM

## 2024-05-07 NOTE — [facility] Suicide Risk Assessment (Signed)
 Suicide Risk Assessment  Admission Assessment    [facility] Admission Suicide Risk Assessment   Nursing information obtained from:  Patient  Demographic factors:  Low socioeconomic status, Unemployed  Current Mental Status:  NA  Loss Factors:  Loss of significant relationship, Financial problems / change in socioeconomic status  Historical Factors:  NA  Risk Reduction Factors:  Responsible for children under 41 years of age  Total Time spent with patient: 1.5 hours including H&P.  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  Continued Clinical Symptoms:  Alcohol Use Disorder Identification Test Final Score (AUDIT): 0 The "Alcohol Use Disorders Identification Test", Guidelines for Use in Primary Care, Second Edition.  World Science writer [facility]). Score between 0-7:  no or low risk or alcohol related problems. Score between 8-15:  moderate risk of alcohol related problems. Score between 16-19:  high risk of alcohol related problems. Score 20 or above:  warrants further diagnostic evaluation for alcohol dependence and treatment.  CLINICAL FACTORS:   Severe Anxiety and/or Agitation Alcohol/Substance Abuse/Dependencies More than one psychiatric diagnosis Unstable or Poor Therapeutic Relationship  Musculoskeletal: Strength & Muscle Tone: within normal limits Gait & Station: normal Patient leans: N/A  Psychiatric Specialty Exam:  Presentation  General Appearance:  Casual  Eye Contact: Minimal  Speech: Clear and Coherent (speaks monotonically.)  Speech  Volume: Decreased  Handedness: Right   Mood and Affect  Mood: Anxious; Depressed (Rates depression & anxiety both #10.)  Affect: -- (Restricted.)   Thought Process  Thought Processes: Other (comment) (Poor.)  Descriptions of Associations:-- (Unable to assess.)  Orientation:Partial  Thought Content:Paranoid Ideation  History of Schizophrenia/Schizoaffective disorder:No  Duration of Psychotic Symptoms:N/A  Hallucinations:Hallucinations: None  Ideas of Reference:None  Suicidal Thoughts:Suicidal Thoughts: No  Homicidal Thoughts:Homicidal Thoughts: No   Sensorium  Memory: Immediate Fair; Recent Fair; Remote Fair  Judgment: Poor  Insight: Lacking   Executive Functions  Concentration: Fair  Attention Span: Fair  Recall: Fair  Fund of Knowledge: Poor  Language: Fair  Psychomotor Activity  Psychomotor Activity:Psychomotor Activity: Decreased  Assets  Assets: Desire for Improvement; Resilience; Social Support; Housing  Sleep  Sleep:Sleep: Good Number of Hours of Sleep: 10  Physical Exam:See H&P.   Body mass index is 34.67 kg/m.  COGNITIVE FEATURES THAT CONTRIBUTE TO RISK:  Polarized thinking and Thought constriction (tunnel vision)    SUICIDE RISK:   Severe:  Frequent, intense, and enduring suicidal ideation, specific plan, no subjective intent, but some objective markers of intent (i.e., choice of lethal method), the method is accessible, some limited preparatory behavior, evidence of impaired self-control, severe dysphoria/symptomatology, multiple risk factors present, and few if any protective factors, particularly a lack of social support.  PLAN OF CARE: See H&P.  I certify that inpatient services furnished can reasonably be expected to improve the patient's condition.   Asuncion Layer, NP, pmhnp, [id number]-bc. 05/07/2024, 3:12 PM

## 2024-05-07 NOTE — Plan of Care (Addendum)
 Pt noted in bed with avertive eye contact, logical speech, guarded, isolative to room this shift and minimal/ superficial on interactions. However, she denies SI, HI, AVH and pain when assessed "No". Reports she slept "fine" with "No, I haven't eaten yet". Out of bed for medications and hygiene supplies. Pt did not attend scheduled groups despite multiple prompts. Compliant with medications, denies adverse drug reactions. Urine cup given for sample. Safety checks maintained at Q 15 minutes intervals without outburst. Emotional support, encouragement and reassurance offered to pt.   Problem: Coping: Goal: Ability to demonstrate self-control will improve Outcome: Progressing   Problem: Safety: Goal: Periods of time without injury will increase Outcome: Progressing   Problem: Activity: Goal: Sleeping patterns will improve Outcome: Progressing

## 2024-05-08 LAB — TSH: TSH: 0.422 u[IU]/mL (ref 0.350–4.500)

## 2024-05-08 LAB — URINALYSIS, ROUTINE W REFLEX MICROSCOPIC
Bacteria, UA: NONE SEEN
Bilirubin Urine: NEGATIVE
Glucose, UA: NEGATIVE mg/dL
Ketones, ur: NEGATIVE mg/dL
Nitrite: NEGATIVE
Protein, ur: NEGATIVE mg/dL
Specific Gravity, Urine: 1.01 (ref 1.005–1.030)
WBC, UA: >50 WBC/hpf (ref 0–5)
pH: 7 (ref 5.0–8.0)

## 2024-05-08 MED ORDER — ARIPIPRAZOLE 5 MG PO TABS: 5.0000 mg | ORAL_TABLET | Freq: Once | ORAL | Status: DC

## 2024-05-08 MED ORDER — ARIPIPRAZOLE 10 MG PO TABS
10.0000 mg | ORAL_TABLET | Freq: Every day | ORAL | Status: DC
  Administered 2024-05-09: 10 mg via ORAL
  Filled 2024-05-08: qty 1

## 2024-05-08 NOTE — Plan of Care (Signed)
  Problem: Education: Goal: Knowledge of [facility] Education information/materials will improve Outcome: Progressing Goal: Emotional status will improve Outcome: Progressing Goal: Mental status will improve Outcome: Progressing Goal: Verbalization of understanding the information provided will improve Outcome: Progressing   Problem: Coping: Goal: Ability to demonstrate self-control will improve Outcome: Progressing   Problem: Safety: Goal: Periods of time without injury will increase Outcome: Progressing   Problem: Activity: Goal: Interest or engagement in activities will improve Outcome: Not Progressing

## 2024-05-08 NOTE — Group Note (Signed)
 Date:  05/08/2024 Time:  8:43 PM  Group Topic/Focus:  Wrap-Up Group:   The focus of this group is to help patients review their daily goal of treatment and discuss progress on daily workbooks.    Participation Level:  Did Not Attend   Kanika Bungert Dacosta 05/08/2024, 8:43 PM
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
## 2024-05-08 NOTE — Progress Notes (Signed)
   05/08/24 2100  Psych Admission Type (Psych Patients Only)  Admission Status Involuntary  Psychosocial Assessment  Patient Complaints Anxiety;Crying spells;Irritability;Panic attack;Worrying  Eye Contact Brief  Facial Expression Flat  Affect Appropriate to circumstance  Speech Logical/coherent  Interaction Assertive  Motor Activity Slow  Appearance/Hygiene Unremarkable  Behavior Characteristics Anxious;Agitated  Mood Apprehensive;Anxious;Depressed  Thought Process  Coherency WDL  Content Paranoia  Delusions Paranoid  Perception WDL  Hallucination None reported or observed  Judgment Limited  Confusion None  Danger to Self  Current suicidal ideation? Denies  Danger to Others  Danger to Others None reported or observed

## 2024-05-08 NOTE — Plan of Care (Signed)

## 2024-05-08 NOTE — [facility] Group Notes (Signed)
 Adult Psychoeducational Group Note  Date:  05/08/2024 Time:  1:48 PM  Group Topic/Focus:  Goals Group:   The focus of this group is to help patients establish daily goals to achieve during treatment and discuss how the patient can incorporate goal setting into their daily lives to aide in recovery. Orientation:   The focus of this group is to educate the patient on the purpose and policies of crisis stabilization and provide a format to answer questions about their admission.  The group details unit policies and expectations of patients while admitted.  Participation Level:  Did Not Attend  Participation Quality:    Affect:    Cognitive:    Insight:   Engagement in Group:    Modes of Intervention:    Additional Comments:    Lisa Crosby O 05/08/2024, 1:48 PM

## 2024-05-08 NOTE — Progress Notes (Signed)
   05/08/24 1000  Psych Admission Type (Psych Patients Only)  Admission Status Involuntary  Psychosocial Assessment  Patient Complaints Anxiety;Crying spells;Depression  Eye Contact Brief  Facial Expression Flat  Affect Appropriate to circumstance  Speech Logical/coherent  Interaction Assertive  Motor Activity Slow  Appearance/Hygiene Unremarkable  Behavior Characteristics Cooperative;Appropriate to situation  Mood Depressed;Anxious  Thought Process  Coherency WDL  Content Paranoia  Delusions Paranoid  Perception Hallucinations  Hallucination Auditory  Judgment Limited  Confusion None  Danger to Self  Current suicidal ideation? Denies  Danger to Others  Danger to Others None reported or observed

## 2024-05-09 MED ORDER — OLANZAPINE 10 MG PO TABS
10.0000 mg | ORAL_TABLET | Freq: Every day | ORAL | Status: DC
  Filled 2024-05-09: qty 1

## 2024-05-09 NOTE — Plan of Care (Signed)
  Problem: Education: Goal: Knowledge of [facility] Education information/materials will improve Outcome: Progressing Goal: Emotional status will improve Outcome: Progressing Goal: Verbalization of understanding the information provided will improve Outcome: Progressing   Problem: Coping: Goal: Ability to demonstrate self-control will improve Outcome: Progressing   Problem: Safety: Goal: Periods of time without injury will increase Outcome: Progressing   Problem: Activity: Goal: Interest or engagement in activities will improve Outcome: Not Progressing

## 2024-05-09 NOTE — Progress Notes (Addendum)
 D: Patient is alert, oriented, isolative, sometimes tearful, sometimes agitated, and cooperative. Endorses HI toward ex boyfriend with no plan. Endorses AVH of family getting hurt. Denies SI and verbally contracts for safety. Patient reports the PRN PO agitation medication she got for worsening hallucinations did not help. She reports it just made her sleep a little bit then the voices came back. Patient denies physical symptoms/pain. Patient remains calm and in her room. She requested stronger medication for the hallucinations and became moderately agitated toward the end of the shift.    A: Scheduled medications administered per MD order. PRN PO haldol  and benadryl  administered. PRN IM haldol , benadryl , and ativan  administered. Support provided. Patient educated on safety on the unit and medications. Routine safety checks every 15 minutes. Patient stated understanding to tell nurse about any new physical symptoms. Patient understands to tell staff of any needs.     R: No adverse drug reactions noted. Patient remains safe at this time and will continue to monitor.    05/09/24 0900  Psych Admission Type (Psych Patients Only)  Admission Status Involuntary  Psychosocial Assessment  Patient Complaints Anxiety;Worrying  Eye Contact Brief  Facial Expression Flat  Affect Appropriate to circumstance  Speech Logical/coherent  Interaction Isolative  Motor Activity Slow  Appearance/Hygiene Unremarkable  Behavior Characteristics Anxious  Mood Depressed;Anxious  Thought Process  Coherency WDL  Content Paranoia  Delusions Paranoid  Perception Hallucinations  Hallucination Visual;Auditory  Judgment Limited  Confusion None  Danger to Self  Current suicidal ideation? Denies  Danger to Others  Danger to Others Reported or observed (thoughts of hurting ex boyfriend, no plan)  Danger to Others Abnormal  Harmful Behavior to others No threats or harm toward other people  Destructive Behavior No threats  or harm toward property
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
## 2024-05-09 NOTE — Group Note (Signed)
 Date:  05/09/2024 Time:  9:21 PM  Group Topic/Focus:  Wrap-Up Group:   The focus of this group is to help patients review their daily goal of treatment and discuss progress on daily workbooks.    Participation Level:  Did Not Attend  Participation Quality:  Did Not Attend  Affect:  Did Not Attend   Cognitive:  Did Not Attend  Insight: None  Engagement in Group:  Did Not Attend  Modes of Intervention:  Did Not Attend  Additional Comments:  Pt was encouraged to attend wrap up group but did not attend.  Dwaine Gip 05/09/2024, 9:21 PM

## 2024-05-09 NOTE — Progress Notes (Signed)
 Pt came to nurses station tearful. Stating that she was " hearing my family's voices really loud"..."Please help me and give me something to make me deal with them".    Pt was given prn medication for agitation.  She took medication without incident and returned to her room.
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
## 2024-05-09 NOTE — Progress Notes (Signed)
 Patient reports that the PRN PO agitation medication she received did not help with her hallucinations.

## 2024-05-10 MED ORDER — OLANZAPINE 5 MG PO TBDP
5.0000 mg | ORAL_TABLET | Freq: Once | ORAL | Status: AC
  Administered 2024-05-10: 5 mg via ORAL
  Filled 2024-05-10: qty 1

## 2024-05-10 MED ORDER — ARIPIPRAZOLE 10 MG PO TABS
20.0000 mg | ORAL_TABLET | Freq: Every day | ORAL | Status: DC
  Administered 2024-05-10 – 2024-05-11 (×2): 20 mg via ORAL
  Filled 2024-05-10 (×2): qty 2

## 2024-05-10 NOTE — Group Note (Signed)
 Recreation Therapy Group Note   Group Topic:Coping Skills  Group Date: 05/10/2024 Start Time: 1020 End Time: 1100 Facilitators: Aniza Shor-McCall, LRT,CTRS Location: [address] Dayroom   Group Topic: Coping Skills   Goal Area(s) Addresses: Patient will define what a coping skill is. Patient will work to create a list of healthy coping skills beginning with each letter of the alphabet. Patient will successfully identify positive coping skills they can use post d/c.  Patient will acknowledge benefit(s) of using learned coping skills post d/c.   Behavioral Response:    Intervention: Individual work   Activity: Coping A to Z. Patient asked to identify what a coping skill is and when they use them. Patients with Clinical research associate discussed healthy versus unhealthy coping skills. Next patients were given a blank worksheet titled "Coping Skills A-Z". Patients were instructed to come up with at least one positive coping skill per letter of the alphabet, addressing a specific challenge (ex: stress, anger, anxiety, depression, grief, doubt, isolation, self-harm/suicidal thoughts, substance use). Patients were given 15 minutes to brainstorm before ideas were presented to the large group. Patients and LRT debriefed on the importance of coping skill selection based on situation and back-up plans when a skill tried is not effective. At the end of group, patients were given an handout of alphabetized strategies to keep for future reference.   Education: Pharmacologist, Scientist, physiological, Discharge Planning.    Education Outcome: Acknowledges education/Verbalizes understanding/In group clarification offered/Additional education needed   Affect/Mood: N/A   Participation Level: Did not attend    Clinical Observations/Individualized Feedback:     Plan: Continue to engage patient in RT group sessions 2-3x/week.   Tag Wurtz-McCall, LRT,CTRS 05/10/2024 1:23 PM
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
## 2024-05-10 NOTE — Progress Notes (Signed)
 Patient complaining of worsening AVH. She is tearful, intolerant, and requesting medication. PRN PO haldol  5mg  and benadryl  50mg  administered. Safety maintained and will continue to monitor.

## 2024-05-10 NOTE — Progress Notes (Addendum)
 Collateral contact   [facility] [address] [place], [place] [address] [phone]  Moira Andrews said she will review the documents and call CSW.   [facility] [address], [place], [place] [address] [phone]  Patient needs to call and complete an interview.  CSW informed patient and she said she will call in a couple of minutes.  [facility] confirmed that patient completed the interview, and "I still didn't get the approval yet." They agreed that CSW will call tomorrow.   Aylan Bayona, LCSWA 05/10/2024

## 2024-05-10 NOTE — Progress Notes (Signed)
   05/09/24 2100  Psych Admission Type (Psych Patients Only)  Admission Status Involuntary  Psychosocial Assessment  Patient Complaints Worrying  Eye Contact Brief  Facial Expression Flat;Sad  Affect Appropriate to circumstance;Depressed  Speech Logical/coherent  Interaction Isolative  Motor Activity Slow  Appearance/Hygiene UTA  Behavior Characteristics Guarded  Mood Depressed;Preoccupied;Pleasant  Thought Process  Coherency WDL  Content Paranoia;Preoccupation  Delusions Paranoid  Perception Hallucinations  Hallucination Auditory;Visual  Judgment Poor  Confusion None  Danger to Self  Current suicidal ideation? Denies

## 2024-05-10 NOTE — Plan of Care (Signed)
  Problem: Education: Goal: Knowledge of  General Education information/materials will improve Outcome: Progressing Goal: Verbalization of understanding the information provided will improve Outcome: Progressing   Problem: Coping: Goal: Ability to demonstrate self-control will improve Outcome: Progressing   Problem: Safety: Goal: Periods of time without injury will increase Outcome: Progressing   Problem: Activity: Goal: Interest or engagement in activities will improve Outcome: Not Progressing

## 2024-05-10 NOTE — Group Note (Signed)
 Date:  05/10/2024 Time:  8:50 PM  Group Topic/Focus:  Wrap-Up Group:   The focus of this group is to help patients review their daily goal of treatment and discuss progress on daily workbooks.    Participation Level:  Did Not Attend  Lisa Crosby 05/10/2024, 8:50 PM

## 2024-05-10 NOTE — Group Note (Signed)
 LCSW Group Therapy Note    Group Date: 05/10/2024 Start Time: 1300 End Time: 1400   Participation:  did not attend    Type of Therapy:  Group Therapy  Topic:  Lifestyle:  from "One Day" to "Today is Day One"  Objective:  To promote mental and physical well-being through lifestyle changes in routine, nutrition, sleep, and movement.  Goals: Increase awareness of how lifestyle habits impact mental health. Encourage one small, achievable wellness goal. Support group sharing and accountability.  Summary:  Group members explored how daily habits influence mental health and discussed the importance of starting with small, manageable changes. Participants identified personal goals and shared reflections on improving structure, sleep, diet, and physical activity.  Therapeutic Modalities: CBT - Identifying and challenging all-or-nothing thinking; promoting realistic, helpful thoughts about change. Psychoeducation - Teaching about the impact of sleep, nutrition, movement, and routine on mental health. Motivational Interviewing - Eliciting personal motivation and exploring readiness for change. Goal-Setting - Supporting SMART goals to build self-efficacy and encourage follow-through.    Artavious Trebilcock O Jazira Maloney, LCSWA 05/10/2024  2:05 PM

## 2024-05-10 NOTE — Progress Notes (Signed)
   05/10/24 2010  Psych Admission Type (Psych Patients Only)  Admission Status Involuntary  Psychosocial Assessment  Patient Complaints Anxiety;Depression;Worrying;Other (Comment) (AVH about someone harming her children)  Eye Contact Avertive  Facial Expression Flat  Affect Anxious  Speech Logical/coherent  Interaction Isolative  Motor Activity Slow  Appearance/Hygiene Disheveled  Behavior Characteristics Cooperative;Anxious;Guarded;Fidgety  Mood Depressed;Anxious;Preoccupied  Thought Process  Coherency WDL  Content Paranoia  Delusions Paranoid  Perception Hallucinations  Hallucination Auditory;Visual  Judgment Impaired  Confusion None  Danger to Self  Current suicidal ideation? Denies  Danger to Others  Danger to Others None reported or observed   Progress note   D: Pt seen in her room rolling around on the bed. Pt denies SI, HI. Endorses AVH of someone telling her they are going to harm her family and seeing someone tie up her daughter. Pt is isolative because the hallucinations are getting worse. Pt asking for stronger medication. Pt given agitation protocol to assist with the voices. Pt rates pain  0/10. Pt endorses increased anxiety and depression due to the hallucinations. No other concerns noted at this time.  A: Pt provided support and encouragement. Pt given scheduled medication as prescribed. PRNs as appropriate. Q15 min checks for safety.   R: Pt safe on the unit. Will continue to monitor.

## 2024-05-10 NOTE — Plan of Care (Signed)
   Problem: Education: Goal: Emotional status will improve Outcome: Not Progressing Goal: Mental status will improve Outcome: Not Progressing

## 2024-05-10 NOTE — Progress Notes (Signed)
 D: Patient is alert, at times tearful, and cooperative. Denies SI and HI and verbally contracts for safety. Patient endorses AVH that are distressing.    A: Scheduled medications administered per MD order. PRN PO haldol  and benadryl  administered. Support provided. Patient educated on safety on the unit and medications. Routine safety checks every 15 minutes. Patient stated understanding to tell nurse about any new physical symptoms. Patient understands to tell staff of any needs.     R: No adverse drug reactions noted. Patient remains safe at this time and will continue to monitor.    05/10/24 0900  Psych Admission Type (Psych Patients Only)  Admission Status Involuntary  Psychosocial Assessment  Patient Complaints Anxiety;Worrying;Crying spells  Eye Contact Brief  Facial Expression Flat  Affect Appropriate to circumstance;Depressed  Speech Logical/coherent  Interaction Isolative  Motor Activity Slow  Appearance/Hygiene Disheveled  Behavior Characteristics Guarded;Anxious  Mood Depressed;Preoccupied  Thought Process  Coherency WDL  Content Paranoia  Delusions Paranoid  Perception Hallucinations  Hallucination Auditory;Visual  Judgment Poor  Confusion None  Danger to Self  Current suicidal ideation? Denies  Danger to Others  Danger to Others None reported or observed  Danger to Others Abnormal  Harmful Behavior to others No threats or harm toward other people  Destructive Behavior No threats or harm toward property

## 2024-05-11 DIAGNOSIS — F333 Major depressive disorder, recurrent, severe with psychotic symptoms: Secondary | ICD-10-CM | POA: Diagnosis not present

## 2024-05-11 MED ORDER — ARIPIPRAZOLE 15 MG PO TABS
30.0000 mg | ORAL_TABLET | Freq: Every day | ORAL | Status: DC
  Administered 2024-05-12 – 2024-05-14 (×3): 30 mg via ORAL
  Filled 2024-05-11 (×3): qty 2

## 2024-05-11 NOTE — Progress Notes (Signed)
   05/11/24 1035  Psych Admission Type (Psych Patients Only)  Admission Status Involuntary  Psychosocial Assessment  Patient Complaints Anxiety;Depression  Eye Contact Fair  Facial Expression Flat  Affect Anxious;Depressed  Speech Logical/coherent  Interaction Minimal  Motor Activity Other (Comment) (WDL)  Appearance/Hygiene Unremarkable  Behavior Characteristics Anxious;Cooperative  Mood Depressed;Anxious;Preoccupied  Thought Process  Coherency WDL  Content Preoccupation;Paranoia  Delusions Paranoid  Perception Hallucinations  Hallucination Auditory;Visual  Judgment Impaired  Confusion None  Danger to Self  Current suicidal ideation? Denies  Danger to Others  Danger to Others None reported or observed

## 2024-05-11 NOTE — Progress Notes (Addendum)
 Collateral contact - DSS Child Protective Services Social Worker [place]   SW [place] attempted to visit patient so she can sign the safety plan.  They scheduled a visit for 1 PM.    Daaron Dimarco, LCSWA 05/11/2024

## 2024-05-11 NOTE — Plan of Care (Signed)
  Problem: Education: Goal: Emotional status will improve Outcome: Progressing   Problem: Activity: Goal: Sleeping patterns will improve Outcome: Progressing   Problem: Coping: Goal: Ability to demonstrate self-control will improve Outcome: Progressing

## 2024-05-11 NOTE — Plan of Care (Signed)
   Problem: Education: Goal: Emotional status will improve Outcome: Progressing Goal: Mental status will improve Outcome: Progressing Goal: Verbalization of understanding the information provided will improve Outcome: Progressing

## 2024-05-11 NOTE — Group Note (Signed)
 Recreation Therapy Group Note   Group Topic:Health and Wellness  Group Date: 05/11/2024 Start Time: 1025 End Time: 1050 Facilitators: Lisa Crosby, LRT,CTRS Location: [address] Dayroom   Group Topic: Wellness  Goal Area(s) Addresses:  Patient will define components of whole wellness. Patient will verbalize benefit of whole wellness.  Behavioral Response:   Intervention: Music  Activity: Exercise. LRT and patients discussed the importance of physical health and the benefits that can come from it. LRT led patients in a series of stretches to loosen up the muscles to prepare for the exercises. Patients took turns leading the group in the exercises of their choosing. Patients could take breaks and get water  as needed.  Education: Wellness, Building control surveyor.   Education Outcome: Acknowledges education/In group clarification offered/Needs additional education.    Affect/Mood: N/A   Participation Level: Did not attend    Clinical Observations/Individualized Feedback:     Plan: Continue to engage patient in RT group sessions 2-3x/week.   Lisa Crosby, LRT,CTRS 05/11/2024 1:10 PM

## 2024-05-11 NOTE — Progress Notes (Signed)
   05/11/24 0601  15 Minute Checks  Location Bedroom  Visual Appearance Calm  Behavior Composed  Sleep (Behavioral Health Patients Only)  Calculate sleep? (Click Yes once per 24 hr at 0600 safety check) Yes  Documented sleep last 24 hours 8.25

## 2024-05-11 NOTE — Progress Notes (Signed)
 D: Pt alert and oriented. Pt rates depression 0/10 and anxiety 4/10.  Pt denies experiencing any SI/HI, or AVH at this time.   A: Scheduled medications administered to pt, per MD orders. Support and encouragement provided. Frequent verbal contact made. Routine safety checks conducted q15 minutes.   R: No adverse drug reactions noted. Pt verbally contracts for safety at this time. Pt complaint with medications and treatment plan. Pt interacts well with others on the unit. Pt remains safe at this time. Will continue to monitor.

## 2024-05-11 NOTE — Group Note (Signed)
 Date:  05/11/2024 Time:  8:49 PM  Group Topic/Focus:  Wrap-Up Group:   The focus of this group is to help patients review their daily goal of treatment and discuss progress on daily workbooks.    Participation Level:  Active  Participation Quality:  Appropriate  Affect:  Appropriate  Cognitive:  Appropriate  Insight: Appropriate  Engagement in Group:  Developing/Improving  Modes of Intervention:  Discussion  Additional Comments:  Pt stated her goal for today was to focus on her treatment plan, discuss a mediation issue with her treatment team, housing set up after treatment. Pt stated she accomplished her goals today. Pt stated she did not get a chance to speak with her doctor and with her social worker about her care today. Pt rated her overall day a 10. Pt stated she was able to contact her daughter and her son today which improved her overall day. Pt stated she felt better about herself tonight. Pt stated she was able to attend dinner tonight. Pt stated she took all medications provided today. Pt stated her appetite was pretty good today. Pt rated her sleep last night was poor. Pt stated the goal tonight was to get some rest. Pt stated she had no physical pain tonight. Pt deny visual hallucinations and auditory issues tonight. Pt denies thoughts of harming herself or others. Pt stated she would alert staff if anything changed.  Dwaine Gip 05/11/2024, 8:49 PM
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
## 2024-05-11 NOTE — Progress Notes (Signed)
 Collateral contact   [facility] [address], [place], [place] [address] [phone]  1:07 PM - The woman said that the referral hasn't been reviewed yet.  She said one of the nurses will review it, and CSW can call again later today.    Shalaunda Weatherholtz, LCSWA 05/11/2024
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
## 2024-05-11 NOTE — Progress Notes (Signed)
Pt refusing lab draw this morning.

## 2024-05-12 ENCOUNTER — Encounter (HOSPITAL_COMMUNITY): Payer: Self-pay

## 2024-05-12 ENCOUNTER — Telehealth (HOSPITAL_COMMUNITY): Payer: Self-pay | Admitting: Pharmacy Technician

## 2024-05-12 ENCOUNTER — Other Ambulatory Visit (HOSPITAL_COMMUNITY): Payer: Self-pay

## 2024-05-12 DIAGNOSIS — F333 Major depressive disorder, recurrent, severe with psychotic symptoms: Secondary | ICD-10-CM | POA: Diagnosis not present

## 2024-05-12 MED ORDER — ARIPIPRAZOLE ER 400 MG IM SRER
400.0000 mg | Freq: Once | INTRAMUSCULAR | Status: AC
  Administered 2024-05-12: 400 mg via INTRAMUSCULAR

## 2024-05-12 NOTE — Progress Notes (Signed)
   05/12/24 0800  Psych Admission Type (Psych Patients Only)  Admission Status Involuntary  Psychosocial Assessment  Patient Complaints Anxiety;Depression  Eye Contact Fair  Facial Expression Flat  Affect Anxious;Depressed  Speech Logical/coherent  Interaction Minimal  Motor Activity Other (Comment) (WDL)  Appearance/Hygiene Unremarkable  Behavior Characteristics Cooperative;Appropriate to situation  Mood Depressed;Anxious  Thought Process  Coherency WDL  Content Preoccupation  Delusions Paranoid  Perception WDL  Hallucination None reported or observed  Judgment Impaired  Confusion None  Danger to Self  Current suicidal ideation? Denies  Danger to Others  Danger to Others None reported or observed  Danger to Others Abnormal  Harmful Behavior to others No threats or harm toward other people  Destructive Behavior No threats or harm toward property

## 2024-05-12 NOTE — BHH Group Notes (Signed)
 Adult Psychoeducational Group Note  Date:  05/12/2024 Time:  11:17 AM  Group Topic/Focus:  Goals Group:   The focus of this group is to help patients establish daily goals to achieve during treatment and discuss how the patient can incorporate goal setting into their daily lives to aide in recovery.  Participation Level:  Active  Participation Quality:  Appropriate and Attentive  Affect:  Appropriate  Cognitive:  Alert and Appropriate  Insight: Good  Engagement in Group:  Engaged  Modes of Intervention:  Exploration  Additional Comments:  Pt participated in group. Pt stated her goal is to live in the moment. Pt stated she will accomplish this by staying mindful and feel what she feels. Pt identified no SI/Hi and will inform staff if anything changes.   Lisa Crosby 05/12/2024, 11:17 AM

## 2024-05-12 NOTE — Plan of Care (Signed)
   Problem: Activity: Goal: Interest or engagement in activities will improve Outcome: Progressing   Problem: Coping: Goal: Ability to verbalize frustrations and anger appropriately will improve Outcome: Progressing   Problem: Safety: Goal: Periods of time without injury will increase Outcome: Progressing

## 2024-05-12 NOTE — Telephone Encounter (Signed)
 Pharmacy Patient Advocate Encounter  Received notification from [facility] that Prior Authorization for Abilify  Maintena 300MG  syringes  has been APPROVED from 05/12/2024 to 05/12/2025. Ran test claim, Copay is $4.00. This test claim was processed through [facility]- copay amounts may vary at other pharmacies due to pharmacy/plan contracts, or as the patient moves through the different stages of their insurance plan.   PA #/Case ID/Reference #: [id number]

## 2024-05-12 NOTE — Telephone Encounter (Signed)
 Patient Product/process development scientist completed.    The patient is insured through [employer] [place].     Ran test claim for Abilify  Maintena 400 mg prefilled syringe and Requires Prior Authorization   This test claim was processed through [employer]- copay amounts may vary at other pharmacies due to [employer], or as the patient moves through the different stages of their insurance plan.     Morgan Arab, CPHT Pharmacy Technician III Certified Patient Advocate [facility] Pharmacy Patient Advocate Team Direct Number: [phone]  Fax: [phone]

## 2024-05-12 NOTE — Progress Notes (Addendum)
 Visit in person - Child Protective Services DSS (CPS) Social Worker visited patient in person.   SW Bernard Brick said that they found out that patient had meth in her system.  Patient signed the safety plan:  patient agreed to leave her children with her mother for now.  Patient still has legal custody of her children.  ACT Team said that they have been working with patient since February and since then, patient has been 3 times in the hospital.  Patient doesn't take medications and knocks at neighbor's doors.  They are in the process of sending a referral to [facility]).    Pleasant Britz, LCSWA 05/12/2024

## 2024-05-12 NOTE — Telephone Encounter (Signed)
 Pharmacy Patient Advocate Encounter   Received notification from Inpatient Request that prior authorization for Abilify  Maintena 300MG  syringes is required/requested.   Insurance verification completed.   The patient is insured through [place] .   Per test claim: PA required; PA submitted to above mentioned insurance via CoverMyMeds Key/confirmation #/EOC [id number] Status is pending

## 2024-05-12 NOTE — Plan of Care (Signed)
   Problem: Education: Goal: Knowledge of [facility] Education information/materials will improve Outcome: Progressing Goal: Emotional status will improve Outcome: Progressing Goal: Mental status will improve Outcome: Progressing Goal: Verbalization of understanding the information provided will improve Outcome: Progressing

## 2024-05-12 NOTE — Group Note (Unsigned)
 Date:  05/13/2024 Time:  5:22 AM  Group Topic/Focus:  Wrap-Up Group:   The focus of this group is to help patients review their daily goal of treatment and discuss progress on daily workbooks.    Participation Level:  Active  Participation Quality:  Appropriate and Sharing  Affect:  Appropriate  Cognitive:  Appropriate  Insight: Appropriate  Engagement in Group:  Engaged and Off Topic  Modes of Intervention:  Discussion and Socialization  Additional Comments:  Patient activity partivpated during wrap up group. Patent rated her day a 6.5 out of 10 then stated that it increased to a 9 out of 10. Patient shared that she was "not ready to be discharged because she felt she was not at her best". However, patient did state that she is feeling better and is not having any AH with help of medication.   Dillard Frame 05/13/2024, 5:22 AM

## 2024-05-12 NOTE — Progress Notes (Addendum)
 Collateral contact [facility] [address] [place], [place] [address] [phone]  CSW asked if patient has been admitted to the program.  The receptionist said they would have an answer by 4 PM today.   Uchechi Denison, LCSWA 05/12/2024

## 2024-05-12 NOTE — BH IP Treatment Plan (Signed)
 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)   Methamphetamine use disorder, severe (HCC)   Psychoactive substance-induced psychosis (HCC)   Current Medications:  Current Facility-Administered Medications  Medication Dose Route Frequency Provider Last Rate Last Admin   acetaminophen  (TYLENOL ) tablet 650 mg  650 mg Oral Q6H PRN Volanda Gruber, NP       alum & mag hydroxide-simeth (MAALOX/MYLANTA) 200-200-20 MG/5ML suspension 30 mL  30 mL Oral Q4H PRN Volanda Gruber, NP       amLODipine  (NORVASC ) tablet 10 mg  10 mg Oral Daily Nwoko, Agnes I, NP   10 mg at 05/12/24 4098   ARIPiprazole  (ABILIFY ) tablet 30 mg  30 mg Oral Daily Izediuno, Vincent A, MD   30 mg at 05/12/24 1191   ARIPiprazole  ER (ABILIFY  MAINTENA) injection 400 mg  400 mg Intramuscular Once Pashayan, Alexander S, DO       haloperidol  (HALDOL ) tablet 5 mg  5 mg Oral TID PRN Volanda Gruber, NP   5 mg at 05/11/24 1035   And   diphenhydrAMINE  (BENADRYL ) capsule 50 mg  50 mg Oral TID PRN Volanda Gruber, NP   50 mg at 05/11/24 1035   haloperidol  lactate (HALDOL ) injection 5 mg  5 mg Intramuscular TID PRN Volanda Gruber, NP   5 mg at 05/09/24 1837   And   diphenhydrAMINE  (BENADRYL ) injection 50 mg  50 mg Intramuscular TID PRN Volanda Gruber, NP   50 mg at 05/09/24 1837   And   LORazepam  (ATIVAN ) injection 2 mg  2 mg Intramuscular TID PRN Volanda Gruber, NP   2 mg at 05/09/24 4782   haloperidol  lactate (HALDOL ) injection 10 mg  10 mg Intramuscular TID PRN Volanda Gruber, NP       And   diphenhydrAMINE  (BENADRYL ) injection 50  mg  50 mg Intramuscular TID PRN Volanda Gruber, NP       And   LORazepam  (ATIVAN ) injection 2 mg  2 mg Intramuscular TID PRN Volanda Gruber, NP       hydrOXYzine  (ATARAX ) tablet 50 mg  50 mg Oral TID PRN Asuncion Layer I, NP   50 mg at 05/08/24 2042   magnesium  hydroxide (MILK OF MAGNESIA) suspension 30 mL  30 mL Oral Daily PRN Volanda Gruber, NP       mirtazapine  (REMERON ) tablet 15 mg  15 mg Oral QHS Nwoko, Agnes I, NP   15 mg at 05/11/24 2028   traZODone  (DESYREL ) tablet 100 mg  100 mg Oral QHS PRN Volanda Gruber, NP   100 mg at 05/08/24 2042   PTA Medications: No medications prior to admission.    Patient Stressors: Financial difficulties   Health problems   Medication change or noncompliance   Substance abuse    Patient Strengths: Ability for insight  Communication skills   Treatment Modalities: Medication Management, Group therapy, Case management,  1 to 1 session with clinician, Psychoeducation, Recreational therapy.   Physician Treatment Plan for Primary Diagnosis: MDD (major depressive disorder), recurrent, severe, with psychosis (HCC) Long Term Goal(s): Improvement in symptoms so as ready for discharge   Short Term Goals: Ability to identify changes  in lifestyle to reduce recurrence of condition will improve Ability to verbalize feelings will improve Ability to disclose and discuss suicidal ideas Ability to demonstrate self-control will improve  Medication Management: Evaluate patient's response, side effects, and tolerance of medication regimen.  Therapeutic Interventions: 1 to 1 sessions, Unit Group sessions and Medication administration.  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  Therapeutic Interventions: 1 to 1 sessions, Unit Group sessions and Medication administration.  Evaluation of Outcomes: Progressing   RN Treatment Plan for Primary Diagnosis: MDD (major depressive disorder), recurrent, severe, with psychosis (HCC) Long Term Goal(s): Knowledge of disease and therapeutic regimen to maintain health will improve  Short Term Goals: Ability to remain free from injury will improve, Ability to verbalize frustration and anger appropriately will improve, Ability to participate in decision making will improve, Ability to verbalize feelings will improve, and Ability to disclose and discuss suicidal ideas  Medication Management: RN will administer medications as ordered by provider, will assess and evaluate patient's response and provide education to patient for prescribed medication. RN will report any adverse and/or side effects to prescribing provider.  Therapeutic Interventions: 1 on 1 counseling sessions, Psychoeducation, Medication administration, Evaluate responses to treatment, Monitor vital signs and CBGs as ordered, Perform/monitor CIWA, COWS, AIMS and Fall Risk screenings as ordered, Perform wound care treatments as ordered.  Evaluation of Outcomes: Progressing   LCSW Treatment Plan for Primary Diagnosis: MDD (major depressive disorder), recurrent, severe, with psychosis (HCC) Long Term Goal(s): Safe transition to appropriate next level of care at discharge, Engage patient in  therapeutic group addressing interpersonal concerns.  Short Term Goals: Engage patient in aftercare planning with referrals and resources, Increase social support, Increase emotional regulation, Facilitate acceptance of mental health diagnosis and concerns, Identify triggers associated with mental health/substance abuse issues, and Increase skills for wellness and recovery  Therapeutic Interventions: Assess for all discharge needs, 1 to 1 time with Social worker, Explore available resources and support systems, Assess for adequacy in community support network, Educate family and significant other(s) on suicide prevention, Complete Psychosocial Assessment, Interpersonal group therapy.  Evaluation of Outcomes: Progressing   Progress in Treatment: Attending groups: Yes. Participating in groups: Yes. Taking medication as prescribed: Yes. Toleration medication: Yes. Family/Significant other contact made: No, patient declined consents Patient understands diagnosis: No. Discussing patient identified problems/goals with staff: Yes. Medical problems stabilized or resolved: Yes. Denies suicidal/homicidal ideation: Yes. Issues/concerns per patient self-inventory: No.   New problem(s) identified: No   New Short Term/Long Term Goal(s):     medication stabilization, elimination of SI thoughts, development of comprehensive mental wellness plan.    Patient Goals:  "I want to get clean.  I've been using methamphetamine, and I'm ready to enter a 30-day inpatient treatment program."    Discharge Plan or Barriers:  Patient recently admitted. CSW will continue to follow and assess for appropriate referrals and  possible discharge planning.      Reason for Continuation of Hospitalization: Hallucinations Homicidal ideation Medication stabilization   Estimated Length of Stay:  2-3 days  Last 3 [place] Suicide Severity Risk Score: Flowsheet Row [facility]Current[facility] Most recent reading at 05/06/2024  3:27 PM ED from 05/05/2024 in [facility] Emergency Department at [facility] Most recent reading at 05/06/2024 12:29 AM ED from 05/05/2024 in [facility] Most recent reading at 05/05/2024  6:13 PM  C-SSRS RISK CATEGORY No Risk Low Risk No Risk       Last PHQ 2/9 Scores:    11/25/2023    2:58 PM 07/14/2023    1:33 PM 04/07/2023    2:04 PM  Depression screen PHQ 2/9  Decreased Interest 3 3 0  Down, Depressed, Hopeless 3 3 0  PHQ - 2 Score 6 6 0  Altered sleeping 3 3 0  Tired, decreased energy 3 3 0  Change in appetite 3 3 0  Feeling bad or failure about yourself  3 3 0  Trouble concentrating 3 3 0  Moving slowly or fidgety/restless 3 0 0  Suicidal thoughts 0 0 0  PHQ-9 Score 24 21 0    Scribe for Treatment Team: Vonzell Guerin, LCSWA 05/12/2024 1:39 PM
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
## 2024-05-12 NOTE — Group Note (Signed)
 Recreation Therapy Group Note   Group Topic:Communication  Group Date: 05/12/2024 Start Time: 1021 End Time: 1058 Facilitators: Indy Kuck-McCall, LRT,CTRS Location: [address] Dayroom   Group Topic: Communication, Problem Solving   Goal Area(s) Addresses:  Patient will effectively listen to complete activity.  Patient will identify communication skills used to make activity successful.  Patient will identify how skills used during activity can be used to reach post d/c goals.    Behavioral Response: Engaged   Intervention: Building surveyor Activity - Geometric pattern cards, pencils, blank paper    Activity: Geometric Drawings.  Three volunteers from the peer group will be shown an abstract picture with a particular arrangement of geometrical shapes.  Each round, one 'speaker' will describe the pattern, as accurately as possible without revealing the image to the group.  The remaining group members will listen and draw the picture to reflect how it is described to them. Patients with the role of 'listener' cannot ask clarifying questions but, may request that the speaker repeat a direction. Once the drawings are complete, the presenter will show the rest of the group the picture and compare how close each person came to drawing the picture. LRT will facilitate a post-activity discussion regarding effective communication and the importance of planning, listening, and asking for clarification in daily interactions with others.  Education: Environmental consultant, Active listening, Support systems, Discharge planning  Education Outcome: Acknowledges understanding/In group clarification offered/Needs additional education.    Affect/Mood: Appropriate   Participation Level: Engaged   Participation Quality: Independent   Behavior: Appropriate   Speech/Thought Process: Focused   Insight: Good   Judgement: Good   Modes of Intervention: Activity   Patient Response to  Interventions:  Engaged   Education Outcome:  In group clarification offered    Clinical Observations/Individualized Feedback: Pt was very engaged and bright during group. Pt identified ways of communication as verbal and social media. Pt was spoke clearly and good projection when describing her picture. Pt was also very engaging during discussion. Pt spoke on all the things she had been through that led her here. Pt spoke on how she is feeling much better and has learned some techniques and on medication that has helped her a great deal. Pt also stated she was thankful to the staff here and how they so care to the patients.     Plan: Continue to engage patient in RT group sessions 2-3x/week.   Lisa Crosby, LRT,CTRS 05/12/2024 12:19 PM

## 2024-05-13 DIAGNOSIS — F333 Major depressive disorder, recurrent, severe with psychotic symptoms: Secondary | ICD-10-CM | POA: Diagnosis not present

## 2024-05-13 NOTE — Progress Notes (Signed)
   05/13/24 1100  Psych Admission Type (Psych Patients Only)  Admission Status Involuntary  Psychosocial Assessment  Patient Complaints Depression  Eye Contact Fair  Facial Expression Sad  Affect Sad  Speech Logical/coherent  Interaction Assertive  Motor Activity Other (Comment) (WNL)  Appearance/Hygiene Unremarkable  Behavior Characteristics Appropriate to situation  Mood Depressed  Thought Process  Coherency WDL  Content Blaming others  Delusions Paranoid  Perception WDL  Hallucination None reported or observed  Judgment Poor  Confusion None  Danger to Self  Current suicidal ideation? Denies  Danger to Others  Danger to Others None reported or observed

## 2024-05-13 NOTE — [facility] Suicide Risk Assessment (Signed)
 [facility] INPATIENT:  Family/Significant Other Suicide Prevention Education  Suicide Prevention Education:  Education Completed; Lisa Crosby (41 year old daughter) [phone] ,  (name of family member/significant other) has been identified by the patient as the family member/significant other with whom the patient will be residing, and identified as the person(s) who will aid the patient in the event of a mental health crisis (suicidal ideations/suicide attempt).  With written consent from the patient, the family member/significant other has been provided the following suicide prevention education, prior to the and/or following the discharge of the patient.  Daughter said that mom doesn't have any guns or weapons.  Daughter said that she would pick up patient, and patient would go to her mom's home (=daughter's grandmother) upon discharge.  There are no guns or weapons at that home.  "My grandma doesn't like guns or weapons."    The following people live in the home:  grandmother, a 54 year old child and an adult cousin.   The suicide prevention education provided includes the following: Suicide risk factors Suicide prevention and interventions National Suicide Hotline telephone number [facility] assessment telephone number [facility] Emergency Assistance 911 [facility] and/or Residential Mobile Crisis Unit telephone number  Request made of family/significant other to: Remove weapons (e.g., guns, rifles, knives), all items previously/currently identified as safety concern.    The family member/significant other verbalizes understanding of the suicide prevention education information provided.  The family member/significant other agrees to remove the items of safety concern listed above.  Lisa Crosby O Tawnia Schirm, LCSWA 05/13/2024, 8:03 PM

## 2024-05-13 NOTE — Progress Notes (Incomplete)
 Patient is stabilizing appropriately.  She is tolerating her longer acting injectable well.  She is very appreciative of care given and plans to stay on her longer acting injectable.  Her mother has agreed to take her back now that she is on an LAI.  She is scheduled for discharge tomorrow.

## 2024-05-13 NOTE — Plan of Care (Signed)
   Problem: Education: Goal: Emotional status will improve Outcome: Progressing   Problem: Activity: Goal: Interest or engagement in activities will improve Outcome: Progressing

## 2024-05-13 NOTE — Group Note (Signed)
 Date:  05/13/2024 Time:  1130  Group Topic/Focus:  Goals Group:   The focus of this group is to help patients establish daily goals to achieve during treatment and discuss how the patient can incorporate goal setting into their daily lives to aide in recovery.    Participation Level:  Active  Participation Quality:  Appropriate and Attentive  Affect:  Appropriate  Cognitive:  Alert and Appropriate  Insight: Appropriate and Good  Engagement in Group:  Engaged  Modes of Intervention:  Discussion  Additional Comments:Pt state her goal were to love her peers and make her peers laugh. Pt rate her day a 10.  Tita Form 05/13/2024, 5:13 PM

## 2024-05-13 NOTE — Progress Notes (Signed)
   05/12/24 2055  Psych Admission Type (Psych Patients Only)  Admission Status Involuntary  Psychosocial Assessment  Patient Complaints Depression  Eye Contact Fair  Facial Expression Sad  Affect Sad  Speech Logical/coherent  Interaction Assertive  Motor Activity Other (Comment) (WDL)  Appearance/Hygiene Unremarkable  Behavior Characteristics Appropriate to situation  Mood Depressed  Thought Process  Coherency WDL  Content Blaming others  Delusions Paranoid  Perception WDL  Hallucination None reported or observed  Judgment Poor  Confusion None  Danger to Self  Current suicidal ideation? Denies  Danger to Others  Danger to Others None reported or observed

## 2024-05-13 NOTE — Plan of Care (Signed)
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
## 2024-05-13 NOTE — Progress Notes (Addendum)
 Collateral contact [facility] [address] [place], [place] [address] [phone]   The woman asked to fax a document stating that patient is not experiencing suicidal thoughts.    CSW faxed the documents to:  [phone].  [facility] confirmed that the documents were uploaded at 12:31 PM.  Patient was accepted.  CSW was transferred to [facility] to schedule when patient could go there; CSW left a Engineer, technical sales for [employer].     Falicity Sheets, LCSWA 05/13/2024

## 2024-05-13 NOTE — Group Note (Signed)
 Recreation Therapy Group Note   Group Topic:Leisure Education  Group Date: 05/13/2024 Start Time: 1005 End Time: 1045 Facilitators: Catherine Oak-McCall, LRT,CTRS Location: 500 [address] Dayroom   Group Topic: Leisure Education    Goal Area(s) Addresses:  Patient will successfully identify benefits of leisure participation. Patient will successfully identify ways to access leisure activities. Patient will create their ideal community based facility.     Behavioral Response: Engaged   Intervention: Production assistant, radio   Activity: Patients and LRT went over group rules and expectations. Patients could pair up or work individually. Patients were create their ideal community facility. Patients were to determine what that facility would offer, if it was geared to certain age groups and what sets it apart. The facility patients create could focus on various activities and offerings such as animal care, sports, gardening, senior engagement, etc.   Education:  Leisure Education, Discharge Planning   Education Outcome: Acknowledges education   Affect/Mood: Appropriate   Participation Level: Engaged   Participation Quality: Independent   Behavior: Appropriate   Speech/Thought Process: Focused   Insight: Good   Judgement: Good   Modes of Intervention: Art   Patient Response to Interventions:  Engaged   Education Outcome:  In group clarification offered    Clinical Observations/Individualized Feedback: Pt was bright and engaged. 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 Pt and peer co-led the creation of the brochure. Pt focused on the healthy foods offered at the shop. Pt made a point to emphasis the healthy eating with the message "eating healthy creates a strong mental state". Pt was appropriate throughout group session.    Plan: Continue to engage patient in RT group sessions 2-3x/week.   Ruthel Martine-McCall,  LRT,CTRS 05/13/2024 12:01 PM

## 2024-05-13 NOTE — [facility] Suicide Risk Assessment (Addendum)
 [facility] INPATIENT:  Family/Significant Other Suicide Prevention Education  Suicide Prevention Education:  Contact Attempts: Lisa Crosby (mom) [phone], (name of family member/significant other) has been identified by the patient as the family member/significant other with whom the patient will be residing, and identified as the person(s) who will aid the patient in the event of a mental health crisis.    [name] (41 year old daughter) [phone] Daughter gave CSW a phone number for patient's mom, Lisa Crosby [phone].  Lisa Crosby (patient's mom) [phone] (patient wants to go to her mom's home upon discharge) Date and time of first attempt:05/13/2024 / 6:55 PM (CSW left a voicemail) and 8:09 PM.   (Patient said that her mom is 3 years old and doesn't answer her phone)   Philippe Gang O Blanche Scovell, LCSWA 05/13/2024, 6:52 PM

## 2024-05-13 NOTE — [facility] Group Notes (Signed)
 Adult Psychoeducational Group Note  Date:  05/13/2024 Time:  9:06 PM  Group Topic/Focus:  Wrap-Up Group:   The focus of this group is to help patients review their daily goal of treatment and discuss progress on daily workbooks.  Participation Level:  Active  Participation Quality:  Attentive  Affect:  Appropriate  Cognitive:  Alert  Insight: Improving  Engagement in Group:  Engaged  Modes of Intervention:  Discussion  Additional Comments:  Pt attended and participated in wrap up group.  Thomasine Flick Cassandra 05/13/2024, 9:06 PM

## 2024-05-14 DIAGNOSIS — F333 Major depressive disorder, recurrent, severe with psychotic symptoms: Secondary | ICD-10-CM | POA: Diagnosis not present

## 2024-05-14 DIAGNOSIS — F200 Paranoid schizophrenia: Principal | ICD-10-CM | POA: Insufficient documentation

## 2024-05-14 MED ORDER — ARIPIPRAZOLE 30 MG PO TABS: 30.0000 mg | ORAL_TABLET | Freq: Every day | ORAL | 0 refills | Status: DC

## 2024-05-14 MED ORDER — ABILIFY MAINTENA 400 MG IM PRSY: 400.0000 mg | PREFILLED_SYRINGE | INTRAMUSCULAR | 0 refills | Status: AC

## 2024-05-14 MED ORDER — MIRTAZAPINE 15 MG PO TABS: 15.0000 mg | ORAL_TABLET | Freq: Every day | ORAL | 0 refills | Status: DC

## 2024-05-14 NOTE — Plan of Care (Signed)
  Problem: Education: Goal: Knowledge of [facility] Education information/materials will improve Outcome: Adequate for Discharge Goal: Emotional status will improve Outcome: Adequate for Discharge Goal: Mental status will improve Outcome: Adequate for Discharge Goal: Verbalization of understanding the information provided will improve Outcome: Adequate for Discharge   Problem: Activity: Goal: Interest or engagement in activities will improve Outcome: Adequate for Discharge Goal: Sleeping patterns will improve Outcome: Adequate for Discharge   Problem: Coping: Goal: Ability to verbalize frustrations and anger appropriately will improve Outcome: Adequate for Discharge   Problem: Health Behavior/Discharge Planning: Goal: Identification of resources available to assist in meeting health care needs will improve Outcome: Adequate for Discharge Goal: Compliance with treatment plan for underlying cause of condition will improve Outcome: Adequate for Discharge   Problem: Physical Regulation: Goal: Ability to maintain clinical measurements within normal limits will improve Outcome: Adequate for Discharge   Problem: Safety: Goal: Periods of time without injury will increase Outcome: Adequate for Discharge   Problem: Education: Goal: Knowledge of [facility] Education information/materials will improve Outcome: Adequate for Discharge Goal: Emotional status will improve Outcome: Adequate for Discharge Goal: Mental status will improve Outcome: Adequate for Discharge Goal: Verbalization of understanding the information provided will improve Outcome: Adequate for Discharge   Problem: Safety: Goal: Periods of time without injury will increase Outcome: Adequate for Discharge   Problem: Education: Goal: Will be free of psychotic symptoms Outcome: Adequate for Discharge

## 2024-05-14 NOTE — [facility] Suicide Risk Assessment (Signed)
 [facility] INPATIENT:  Family/Significant Other Suicide Prevention Education  Suicide Prevention Education:  Education Completed; with patient,  (name of family member/significant other) has been identified by the patient as the family member/significant other with whom the patient will be residing, and identified as the person(s) who will aid the patient in the event of a mental health crisis (suicidal ideations/suicide attempt).  With written consent from the patient, the family member/significant other has been provided the following suicide prevention education, prior to the and/or following the discharge of the patient.  Patient was given "Suicide Prevention Information" brochure.  Patient said she doesn't have any guns or weapons.  She said there are no guns or weapons in her mom's home, where she will go upon discharge.  The suicide prevention education provided includes the following: Suicide risk factors Suicide prevention and interventions National Suicide Hotline telephone number [facility] assessment telephone number [facility] Emergency Assistance 911 [facility] and/or Residential Mobile Crisis Unit telephone number  Request made of family/significant other to: Remove weapons (e.g., guns, rifles, knives), all items previously/currently identified as safety concern.   Remove drugs/medications (over-the-counter, prescriptions, illicit drugs), all items previously/currently identified as a safety concern.  The family member/significant other verbalizes understanding of the suicide prevention education information provided.  The family member/significant other agrees to remove the items of safety concern listed above.  Gemma Ruan O Janye Maynor, LCSWA 05/14/2024, 9:14 AM

## 2024-05-14 NOTE — Progress Notes (Signed)
 Pt discharged to lobby. Pt was stable and appreciative at that time. All papers and prescriptions were given and valuables returned. Verbal understanding expressed. Denies SI/HI and A/VH. Pt given opportunity to express concerns and ask questions.

## 2024-05-14 NOTE — [facility] Suicide Risk Assessment (Signed)
 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  No EPS.  Eye Contact: Good.  Speech: Spontaneous.  Normal rate, tone and volume.  Normal prosody of speech.  Mood and Affect  Mood: Euthymic.  Affect: Full range and appropriate.  Thought Process  Thought Processes: Linear and goal directed.  Descriptions of Associations:Intact  Orientation:Full (Time, Place and Person)  Thought Content: Future oriented.  No current suicidal thoughts.  No homicidal thoughts.  No thoughts of violence.  No negative ruminative flooding.  No guilty ruminations.  No delusional theme.  No obsessions.  Hallucinations: No hallucination in any modality.  Sensorium  Memory: Good.  Judgment: Good.  Insight: Good  Executive Functions  Concentration: Good.  Attention Span: Good.  Recall: Good.  Fund of Knowledge: Good.  Language: Good   Psychomotor Activity  Normal psychomotor activity    Physical Exam: Physical Exam ROS Blood pressure (!) 124/96, pulse 75, temperature 98.7 F (37.1 C), temperature source Oral, resp. rate 18, height 5\' 4"  (1.626 m), weight 91.6 kg, SpO2 99%. Body mass index is 34.67 kg/m.  Mental Status Per Nursing Assessment::   On Admission:   NA  Demographic Factors:  Low socioeconomic status  Loss Factors: NA  Historical Factors: Family history of mental illness or substance abuse  Risk Reduction Factors:   Responsible for children under 5 years of age, Sense of responsibility to family, Living with another person, especially a relative, Positive social support, Positive therapeutic relationship, and Positive coping skills or problem solving skills  Continued Clinical Symptoms:  Schizophrenia:   Paranoid or undifferentiated type  Cognitive Features That Contribute To Risk:  None    Suicide Risk:  Minimal: No identifiable suicidal ideation.  Patients presenting with no risk factors but with morbid ruminations; may be classified as minimal risk based on the severity of the depressive symptoms   Follow-up Information     [facility] Go on 06/04/2024.   Why: You have an appointment for medication management services on 06/04/24 at 3:00 pm. The appointment will be held in person, but you may call to swtich to Virtual. Contact information: [address] [place] [address] [phone]         [facility] Go on 05/25/2024.   Specialty: Professional Counselor Why: Please go to this provider for therapy services on 05/25/24 at 9:00 am.  You may also go on Monday through Friday, from 9 am to 1 pm. Contact information: [address] [place]  [address] [place] [address] [phone]         [facility] Follow up.   Specialty: Addiction Medicine Why: You were accepted into the program.  Please call to schededule an appointment. Contact information: [address] [place] [address] [phone]         [facility] Follow up.   Why: Referral has been made.  Please call and check  on the status of the referral. Contact information: [address] [place] [address] [phone]                  Plan Of Care/Follow-up recommendations:  See discharge summary.  Amelie Jury, MD 05/14/2024, 9:33 AM

## 2024-05-14 NOTE — Progress Notes (Signed)
  [facility] Adult Case Management Discharge Plan :  Will you be returning to the same living situation after discharge:  Yes At discharge, do you have transportation home?: Yes,  patient's daughter, Joselyn, will pick her up at 10:00 AM.   Do you have the ability to pay for your medications: Yes,  patient has insurance.  Release of information consent forms completed and in the chart;  Patient's signature needed at discharge.  Patient to Follow up at:  Follow-up Information     [facility] Go on 06/04/2024.   Why: You have an appointment for medication management services on 06/04/24 at 3:00 pm. The appointment will be held in person, but you may call to swtich to Virtual. Contact information: [address] [place] [address] [phone]         [facility] Go on 05/25/2024.   Specialty: Professional Counselor Why: Please go to this provider for therapy services on 05/25/24 at 9:00 am.  You may also go on Monday through Friday, from 9 am to 1 pm. Contact information: [address] [place]  [address] [place] [address] [phone]         [facility] Follow up.   Specialty: Addiction Medicine Why: You were accepted into the program.  Please call to schededule an appointment. Contact information: [address] [place] [address] [phone]         [facility] Follow up.   Why: Referral has been made.  Please call and check on the status of the referral. Contact information: [address] [place] [address] [phone]                 Next level of care provider has access to [facility] Link:no  Safety Planning and Suicide Prevention discussed: Denece Finger (sister) [phone], Chloeann Alfred (72 year old daughter) [phone] and with patient   Has patient been referred to the Quitline?: Patient refused referral for treatment.  Patient admitted to  smoking but doesn't want to stop at this time.  Patient has been referred for addiction treatment:  At admission, patient tested positive for amphetamines.  [facility] accepted patient into an inpatient program.  Patient was advised to call and schedule an appointment.    Rydge Texidor O Cammy Sanjurjo, LCSWA 05/14/2024, 9:18 AM
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
## 2024-05-14 NOTE — [facility] Suicide Risk Assessment (Signed)
 [facility] INPATIENT:  Family/Significant Other Suicide Prevention Education  Suicide Prevention Education:  Education Completed; Lisa Crosby (sister) [phone],  (name of family member/significant other) has been identified by the patient as the family member/significant other with whom the patient will be residing, and identified as the person(s) who will aid the patient in the event of a mental health crisis (suicidal ideations/suicide attempt).  With written consent from the patient, the family member/significant other has been provided the following suicide prevention education, prior to the and/or following the discharge of the patient.  Sister lives in the home where patient will go.  Sister said that patient doesn't have any guns or weapons.  There are no guns or weapons in the home.  Sister will secure medications and sharp objects.  Sister doesn't have any concerns about patient returning home, other than she requested that medications are sent to the pharmacy.  When asked if there are any reasons why patient couldn't return home, she said no.  The following people live in the home:  mom, sister, patient, patient's daughter and nephew.   The suicide prevention education provided includes the following: Suicide risk factors Suicide prevention and interventions National Suicide Hotline telephone number [facility] assessment telephone number [facility] Emergency Assistance 911 [facility] and/or Residential Mobile Crisis Unit telephone number  Request made of family/significant other to: Remove weapons (e.g., guns, rifles, knives), all items previously/currently identified as safety concern.   Remove drugs/medications (over-the-counter, prescriptions, illicit drugs), all items previously/currently identified as a safety concern.  The family member/significant other verbalizes understanding of the suicide prevention education information provided.  The family member/significant  other agrees to remove the items of safety concern listed above.  Lisa Crosby Lisa Crosby, LCSWA 05/14/2024, 9:58 AM

## 2024-05-14 NOTE — Progress Notes (Signed)
   05/13/24 2035  Psych Admission Type (Psych Patients Only)  Admission Status Involuntary  Psychosocial Assessment  Patient Complaints Anxiety  Eye Contact Fair  Facial Expression Anxious  Affect Appropriate to circumstance  Speech Logical/coherent  Interaction Assertive  Motor Activity Other (Comment) (WDL)  Appearance/Hygiene Unremarkable  Behavior Characteristics Appropriate to situation;Cooperative  Mood Anxious  Thought Process  Coherency WDL  Content Blaming others  Delusions None reported or observed  Perception WDL  Hallucination None reported or observed  Judgment WDL  Confusion None  Danger to Self  Current suicidal ideation? Denies  Danger to Others  Danger to Others None reported or observed

## 2024-05-18 ENCOUNTER — Encounter: Payer: Self-pay | Admitting: *Deleted

## 2024-06-17 ENCOUNTER — Encounter (HOSPITAL_COMMUNITY): Payer: Self-pay | Admitting: *Deleted

## 2024-06-17 ENCOUNTER — Other Ambulatory Visit: Payer: Self-pay

## 2024-06-17 ENCOUNTER — Emergency Department (HOSPITAL_COMMUNITY)
Admission: EM | Admit: 2024-06-17 | Discharge: 2024-06-17 | Discharge status: Against Medical Advice | Payer: MEDICAID | Attending: Emergency Medicine | Admitting: Emergency Medicine

## 2024-06-17 DIAGNOSIS — M542 Cervicalgia: Secondary | ICD-10-CM | POA: Insufficient documentation

## 2024-06-17 DIAGNOSIS — Z5321 Procedure and treatment not carried out due to patient leaving prior to being seen by health care provider: Secondary | ICD-10-CM | POA: Insufficient documentation

## 2024-06-17 LAB — PREGNANCY, URINE: Preg Test, Ur: NEGATIVE

## 2024-06-17 NOTE — ED Notes (Signed)
Family at BS

## 2024-06-17 NOTE — ED Triage Notes (Addendum)
 BIB [facility] from home for: called out for neck painafter giving plasma this am. EMS reports concern for MH evaluation. Arrives alert, NAD, calm, interactive, jovial, cooperative, manic. Stopped c/o neck pain enroute. VSS. CBG 161. Rates neck pain on arrival 8/10. Describes self as recovering addict.

## 2024-06-17 NOTE — ED Notes (Addendum)
 Up to b/r, urine sent, NAD, calm, steady gait. Talking on cell phone.

## 2024-06-17 NOTE — ED Provider Notes (Signed)
 Patient eloped prior to being seen.   Garrick Charleston, MD 06/17/24 [phone]

## 2024-06-17 NOTE — ED Notes (Signed)
 Patient seen walking out of EMS bay.

## 2024-07-02 ENCOUNTER — Telehealth: Payer: Self-pay | Admitting: Family Medicine

## 2024-07-02 NOTE — Telephone Encounter (Signed)
 Called patient to clarify what medication that she would need refilled due to the medication list (currently) is not prescribed from her PCP.  Patient stated that she was going through a psychotic break and that no longer need any medications refilled except her BP medication but she did stated that the BP medication will last her until she see her PCP.  Patient states that she is doing much better now. And she will keep her appointment for 07/09/24 to see Dr. Toma.  Harlene Reiter, CMA

## 2024-07-02 NOTE — Telephone Encounter (Signed)
 Patient came stating that she needs a refill on all of her medications please. Also asked for bp medicine to be sent it again as well.

## 2024-07-09 ENCOUNTER — Ambulatory Visit: Payer: MEDICAID

## 2024-07-09 NOTE — Progress Notes (Deleted)
    SUBJECTIVE:   CHIEF COMPLAINT / HPI:   STD check ***  Med refills  PERTINENT  PMH / PSH: ***  OBJECTIVE:   There were no vitals taken for this visit.  ***  ASSESSMENT/PLAN:   Assessment & Plan    Payton Coward, MD [facility]

## 2024-07-13 ENCOUNTER — Other Ambulatory Visit (HOSPITAL_COMMUNITY)
Admission: RE | Admit: 2024-07-13 | Discharge: 2024-07-13 | Disposition: A | Discharge status: Home / Self Care | Payer: MEDICAID | Source: Ambulatory Visit | Attending: Family Medicine | Admitting: Family Medicine

## 2024-07-13 ENCOUNTER — Ambulatory Visit (INDEPENDENT_AMBULATORY_CARE_PROVIDER_SITE_OTHER): Payer: MEDICAID | Admitting: Family Medicine

## 2024-07-13 ENCOUNTER — Encounter: Payer: Self-pay | Admitting: Family Medicine

## 2024-07-13 ENCOUNTER — Other Ambulatory Visit (HOSPITAL_COMMUNITY): Payer: Self-pay

## 2024-07-13 ENCOUNTER — Ambulatory Visit: Payer: Self-pay | Admitting: Family Medicine

## 2024-07-13 VITALS — BP 182/90 | HR 87 | Temp 98.1°F | Wt 223.0 lb

## 2024-07-13 DIAGNOSIS — I10 Essential (primary) hypertension: Secondary | ICD-10-CM

## 2024-07-13 DIAGNOSIS — Z59819 Housing instability, housed unspecified: Secondary | ICD-10-CM

## 2024-07-13 DIAGNOSIS — Z79899 Other long term (current) drug therapy: Secondary | ICD-10-CM

## 2024-07-13 DIAGNOSIS — L24A9 Irritant contact dermatitis due friction or contact with other specified body fluids: Secondary | ICD-10-CM

## 2024-07-13 DIAGNOSIS — F411 Generalized anxiety disorder: Secondary | ICD-10-CM

## 2024-07-13 DIAGNOSIS — F200 Paranoid schizophrenia: Secondary | ICD-10-CM

## 2024-07-13 DIAGNOSIS — F333 Major depressive disorder, recurrent, severe with psychotic symptoms: Secondary | ICD-10-CM

## 2024-07-13 DIAGNOSIS — B9689 Other specified bacterial agents as the cause of diseases classified elsewhere: Secondary | ICD-10-CM

## 2024-07-13 DIAGNOSIS — Z113 Encounter for screening for infections with a predominantly sexual mode of transmission: Secondary | ICD-10-CM | POA: Diagnosis not present

## 2024-07-13 DIAGNOSIS — E66812 Obesity, class 2: Secondary | ICD-10-CM

## 2024-07-13 DIAGNOSIS — Z124 Encounter for screening for malignant neoplasm of cervix: Secondary | ICD-10-CM | POA: Diagnosis not present

## 2024-07-13 LAB — POCT WET PREP (WET MOUNT)
Clue Cells Wet Prep Whiff POC: POSITIVE
Trichomonas Wet Prep HPF POC: ABSENT

## 2024-07-13 MED ORDER — WEGOVY 0.25 MG/0.5ML ~~LOC~~ SOAJ: 0.2500 mg | SUBCUTANEOUS | 0 refills | Status: DC

## 2024-07-13 MED ORDER — METRONIDAZOLE 500 MG PO TABS: 500.0000 mg | ORAL_TABLET | Freq: Two times a day (BID) | ORAL | 0 refills | Status: DC

## 2024-07-13 MED ORDER — MIRTAZAPINE 15 MG PO TABS: 15.0000 mg | ORAL_TABLET | Freq: Every day | ORAL | 2 refills | Status: AC

## 2024-07-13 MED ORDER — HYDROCORTISONE 2.5 % EX CREA: TOPICAL_CREAM | Freq: Two times a day (BID) | CUTANEOUS | 0 refills | Status: DC

## 2024-07-13 MED ORDER — METFORMIN HCL ER 500 MG PO TB24
500.0000 mg | ORAL_TABLET | Freq: Every day | ORAL | 0 refills | Status: AC
Start: 2024-07-13 — End: ?

## 2024-07-13 NOTE — Assessment & Plan Note (Signed)
 43 y.o. with mildly malodorous white discharge.  Wet prep performed today shows BV. - Wet prep as above, treat with metronidazole  500 mg BID x7 days - STI screening for GC/chlamydia, trichomonas - Declined HIV and RPR after counseling - Discussed protection during intercourse and contraceptive methods

## 2024-07-13 NOTE — Assessment & Plan Note (Signed)
 Previous high-risk HPV result, has no-showed colpo clinic x3 - Pap performed with genotyping, will likely require follow up colpo

## 2024-07-13 NOTE — Assessment & Plan Note (Signed)
 No SI or HI, no delusions or hallucinations.  No evidence of acute psychosis.  However, patient is significantly emotionally labile and has not taken antipsychotics in approximately 70 days. - Resume mirtazapine  15 mg at bedtime - Resume aripiprazole  30 mg daily - Follow up in 2 weeks to discuss resuming aripiprazole  monthly injections

## 2024-07-13 NOTE — Progress Notes (Signed)
 SUBJECTIVE:   CHIEF COMPLAINT / HPI:  Lisa Crosby is a 43 y.o. 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  She reports vaginal odor for 1 week. 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 No longer taking aripiprazole  30 mg tablet daily.  Back rash Has had an itchy back rash for about a week ago. Has not tried any treatments.   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 Respiratory: Normal effort, no obvious respiratory distress. Abdominal: No suprapubic tenderness, no abdominal tenderness with deep or light palpation.  No hepatosplenomegaly. Pelvic: Vulva: Normal appearing vulva with no masses, tenderness or lesions. Vagina: Normal appearing vagina  with normal color, no lesions, with white discharge present. Cervix: No lesions, white discharge present. *Chaperone Rico Pesa, CMA present for pelvic exam.  Psych: Well kempt appearance, good eye contact.  No passive or active SI, no HI.  Full range affect.  Disorganized and tangential speech, appears restless.  Interruptible and no flight of ideas.  Diminished insight and judgement.  Denies A/V hallucinations.   ASSESSMENT/PLAN:   Assessment & Plan Screen for STD (sexually transmitted disease) 43 y.o. with mildly malodorous white discharge.  Wet prep performed today shows BV. - Wet prep as above, treat with metronidazole  500 mg BID x7 days - STI screening for GC/chlamydia, trichomonas - Declined HIV and RPR after counseling - Discussed protection during intercourse and contraceptive methods Cervical cancer screening Previous high-risk HPV result, has no-showed colpo clinic x3 - Pap performed with genotyping, will likely require follow up colpo Paranoid schizophrenia (HCC) Generalized anxiety disorder MDD (major depressive disorder), recurrent, severe, with psychosis (HCC) No SI or HI, no delusions or hallucinations.  No evidence of acute psychosis.  However, patient is significantly emotionally labile and has not taken antipsychotics in approximately 70 days. - Resume mirtazapine  15 mg at bedtime - Resume aripiprazole  30 mg daily - Follow up in 2 weeks to discuss resuming aripiprazole  monthly injections Irritant contact dermatitis due to other body fluid Irritant dermatitis along bra line over upper middle back. - Hydrocortisone  2.5% for up to 2 weeks, no longer - Counseled on avoiding long term steroid use Housing insecurity Unhoused, reports living in car with 3 kids. - Amb ref VBCI  Class 2 obesity without serious comorbidity in adult, unspecified BMI, unspecified obesity type Long term current use of antipsychotic medication - Start metformin  500 mg daily Hypertension,  unspecified type Poorly controlled.  Deferred to next visit given patient's apprehension to medication. - Follow up in 2 weeks  Lisa Obi Toma, MD [facility]

## 2024-07-13 NOTE — Assessment & Plan Note (Signed)
Start metformin 500 mg daily.

## 2024-07-13 NOTE — Patient Instructions (Addendum)
 It was great to see you today! Thank you for choosing [facility] for your primary care.  Today we addressed: 1. Screen for STD We are checking some labs today. You will get a MyChart message or a letter if results are normal. Otherwise, you will get a call from us . I am also screening you for HPV and cervical cancer.  2. Schizophrenia and depression I am restarting your mirtazapine  and aripiprazole  daily pills, you cannot do the injections here and you will need to follow-up with your psychiatrist to restart those.  3. Weight loss I we will start you on metformin  today after you take this for some time, I can prescribe Wegovy  and see if that will be approved by insurance.  They would likely deny you did not try metformin  first  4. Back rash This is likely irritation of your skin, you can use the hydrocortisone  cream I prescribed for up to 2 weeks at a time then you must wait for 2 to 4 weeks prior to using it again to avoid thinning your skin and making it bleed.  5. Housing I will have our social workers call to discuss housing assistance options.  You should return to our clinic in 2 weeks for blood pressure and other medicine follow-up.  Thank you for coming to see us  at [facility] and for the opportunity to care for you! Toma, Matisha Termine, MD 07/13/2024, 12:10 PM

## 2024-07-14 ENCOUNTER — Telehealth: Payer: Self-pay

## 2024-07-14 NOTE — Progress Notes (Signed)
 Complex Care Management Note  Care Guide Note 07/14/2024 Name: SHYLA GAYHEART MRN: [id number] DOB: 10-May-1981  Logan JULIANNA Sharps is a 42 y.o. year old female who sees Shitarev, Dimitry, MD for primary care. I reached out to Logan JULIANNA Sharps by phone today to offer complex care management services.  Ms. Colon was given information about Complex Care Management services today including:   The Complex Care Management services include support from the care team which includes your Nurse Care Manager, Clinical Social Worker, or Pharmacist.  The Complex Care Management team is here to help remove barriers to the health concerns and goals most important to you. Complex Care Management services are voluntary, and the patient may decline or stop services at any time by request to their care team member.   Complex Care Management Consent Status: Patient agreed to services and verbal consent obtained.   Follow up plan:  Telephone appointment with complex care management team member scheduled for:  07/19/2024  Encounter Outcome:  Patient Scheduled  Jeoffrey Buffalo , RMA     [place]  [facility], [facility] Guide  Direct Dial: [phone]  Website: [email or web address]

## 2024-07-19 ENCOUNTER — Other Ambulatory Visit: Payer: MEDICAID

## 2024-07-19 LAB — CYTOLOGY - PAP
Adequacy: ABSENT
Chlamydia: NEGATIVE
Comment: NEGATIVE
Comment: NEGATIVE
Comment: NEGATIVE
Comment: NEGATIVE
Comment: NEGATIVE
Comment: NORMAL
Diagnosis: UNDETERMINED — AB
HPV 16: NEGATIVE
HPV 18 / 45: NEGATIVE
High risk HPV: POSITIVE — AB
Neisseria Gonorrhea: NEGATIVE
Trichomonas: POSITIVE — AB

## 2024-07-19 NOTE — Patient Instructions (Signed)
 Visit Information  Thank you for taking time to visit with me today.   Tailored Plan Medicaid On June 09, 2023 some people on [place] Medicaid will move to a new kind of Medicaid health plan called a Tailored Plan. Tailored Plans cover your doctor visits, prescription drugs, and health care services.    If your [place] Medicaid will move to a Tailored Plan, you should have gotten a letter and welcome packet. If you're not sure, call your [place] Medicaid Enrollment Broker at [phone] and ask.  Check out these free materials, in [place] and [place], to learn more about your Tailored Plan: [email or web address]/Tailored-Plans/Toolkit  Tailored Care Management Services  TCM services are available to you now. If you are a Tailored Plan member or will be and want information about Tailored Care Management Services including rides to appointments and community and home services, call the Care Management provider for your county of residence:    [facility] [place], [place])  Member Services: [phone] Behavioral Health Crisis Line: [phone])[phone], [place], [place], [place], [place])  Member Services: [phone] Behavioral Health Crisis Line: [phone] [phone]     Please call the Suicide and Crisis Lifeline: 988 if you are experiencing a Mental Health or Behavioral Health Crisis or need someone to talk to.  Patient verbalizes understanding of instructions and care plan provided today and agrees to view in MyChart. Active MyChart status and patient understanding of how to access instructions and care plan via MyChart confirmed with patient.     Olam Ally, MSW, LCSW [place]  [facility], [facility] Licensed Clinical Social Worker Direct Dial: [phone]
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
## 2024-07-20 NOTE — Telephone Encounter (Signed)
 Patient calls nurse line in regards to expedited partner treatment for trichomonas.   Patient advised this treatment would have to go to the [facility] pharmacy.   Patient reports she will discuss with her partner and give us  a call back. She reports he currently lives out of stated.   Advised it may be better he seek testing/treatment in his area.

## 2024-07-20 NOTE — Telephone Encounter (Signed)
 Called patient to discuss her Pap smear and STD results.  Her Pap smear continues to demonstrate ASCUS with high risk HPV.  Previously has no-showed 3 colposcopy appointments in the past due to being concerned about how painful they are.  Advised patient that this is an important nest step to take per the ASCCP guidelines to determine if there are any signs of abnormal cells.  Patient understood and is willing to schedule a colposcopy.  Will forward results to Dr. Rosalynn, Dr. Anders, and [facility] to help schedule.  Patient also tested positive for Trichomonas after sexual encounter with a new partner and was previously prescribed Metronidazole  500 mg BID x 7 days to treat BV.  This will be adequate to treat Trichomonas as well.  Patient has not yet picked up the medications from her pharmacy.  Highly recommended that she do, in order to treat the STD as well.  Advised patient to notify her sexual partners and have them tested for STDs and get treated as appropriate.  Patient understood.  Kathrine Melena, DO 07/20/24 11:09 AM

## 2024-07-21 ENCOUNTER — Telehealth: Payer: Self-pay

## 2024-07-21 ENCOUNTER — Other Ambulatory Visit: Payer: MEDICAID

## 2024-07-21 NOTE — Telephone Encounter (Signed)
 Prior authorization submitted for WEGOVY  0.25/0.5MG  to [employer] via Latent.   May require more notes regarding weight loss/ obesity  Key: [id number]

## 2024-07-21 NOTE — Telephone Encounter (Signed)
 Pharmacy Patient Advocate Encounter  Received notification from [facility] that Prior Authorization for WEGOVY  0.25/0.5MG  has been APPROVED from 07/21/24 to 01/21/25   PA #/Case ID/Reference #: [id number]

## 2024-07-26 ENCOUNTER — Telehealth: Payer: MEDICAID
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
## 2024-07-26 NOTE — Patient Instructions (Signed)
 Visit Information  Thank you for taking time to visit with me today. Please don't hesitate to contact me if I can be of assistance to you before our next scheduled appointment.  Our next appointment is by telephone on 07/26/24 at 10:00am Please call the care guide team at [phone] if you need to cancel or reschedule your appointment.   Following is a copy of your care plan:   Goals Addressed   None     Please call 911 if you are experiencing a Mental Health or Behavioral Health Crisis or need someone to talk to.  Patient verbalizes understanding of instructions and care plan provided today and agrees to view in MyChart. Active MyChart status and patient understanding of how to access instructions and care plan via MyChart confirmed with patient.     Tillman Gardener, BSW [place]  [facility], [place] [facility] Social Worker Direct Dial: [phone]  Fax: [phone] Website: [email or web address]

## 2024-07-28 ENCOUNTER — Other Ambulatory Visit: Payer: MEDICAID

## 2024-07-28 ENCOUNTER — Other Ambulatory Visit: Payer: Self-pay | Admitting: Family Medicine

## 2024-07-28 DIAGNOSIS — E66812 Obesity, class 2: Secondary | ICD-10-CM

## 2024-07-28 NOTE — Patient Instructions (Signed)
 Visit Information  Thank you for taking time to visit with me today. Please don't hesitate to contact me if I can be of assistance to you before our next scheduled appointment.  Your next care management appointment is by telephone on 08/18/2024 at 1:00 PM   Please call the care guide team at [phone] if you need to cancel, schedule, or reschedule an appointment.   Please call the Suicide and Crisis Lifeline: 988 if you are experiencing a Mental Health or Behavioral Health Crisis or need someone to talk to.  Olam Ally, MSW, LCSW [place]  [facility], [facility] Health Licensed Clinical Social Worker Direct Dial: [phone]
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
## 2024-08-10 ENCOUNTER — Ambulatory Visit: Payer: MEDICAID | Admitting: Family Medicine

## 2024-08-10 NOTE — Progress Notes (Deleted)
   SUBJECTIVE:   CHIEF COMPLAINT / HPI:  ***: Lisa Crosby is a 43 y.o. female presenting with ***vaginal discharge for *** days.  Partners: *** Practices: *** Contraception: {Contraceptives:21111124}, {DOES NOT does:27190::does not} use barrier method consistently Prior STI:  On 8/5, tested positive for trichomonas and BV after sexual encounter with a new partner, treated with metronidazole  500 mg BID x 7 days Prior and possible pregnancy status: G3P3, *** Pap smear: ASCUS with high-risk HPV on 8/5, referral to colpo clinic was made, though patient has no-showed colpo clinic 3 times Is not*** interested in screening for sexually transmitted infections today   PERTINENT PMH / PSH:  Sexually active with ***   OBJECTIVE:  LMP  (LMP Unknown)   General: NAD, pleasant, able to participate in exam. Respiratory: Normal effort, no obvious respiratory distress. Abdominal: No suprapubic tenderness, no abdominal tenderness with deep or light palpation.  No hepatosplenomegaly. GU:*** Prostate non-tender and not boggy to palpation.  No penile lesions or erythema.  No scrotal edema or tenderness bilaterally. Pelvic:***  Vulva: Normal appearing vulva with no masses, tenderness or lesions, ***. Vagina: Normal appearing vagina with normal color, no lesions, with {GYN VAGINAL DISCHARGE:21986} discharge present, ***. Cervix: No lesions, {GYN VAGINAL DISCHARGE:21986} discharge present. *Chaperone ***, CMA present for pelvic exam.   ASSESSMENT/PLAN:   Assessment & Plan Screening examination for STI   43 y.o. with *** for *** days, as well as ***.  Physical exam significant for *** discharge.  Wet prep performed today shows ***, consistent with ***. Wet prep as above, treat with *** STI screening for ***GC/chlamydia, HIV, RPR ordered Discussed protection during intercourse and contraceptive methods***  No follow-ups on file.  Myda Detwiler Toma, MD [facility]

## 2024-08-18 ENCOUNTER — Other Ambulatory Visit: Payer: MEDICAID

## 2024-08-18 NOTE — Patient Instructions (Signed)
 Visit Information  Thank you for taking time to visit with me today. Please don't hesitate to contact me if I can be of assistance to you before our next scheduled appointment.  Your next care management appointment is no further scheduled appointments.     Please call the care guide team at [phone] if you need to cancel, schedule, or reschedule an appointment.   Please call the Suicide and Crisis Lifeline: 988 if you are experiencing a Mental Health or Behavioral Health Crisis or need someone to talk to. Olam Ally, MSW, LCSW [place]  [facility], [facility] Health Licensed Clinical Social Worker Direct Dial: [phone]
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
## 2024-08-23 ENCOUNTER — Ambulatory Visit: Payer: MEDICAID

## 2024-08-23 NOTE — Progress Notes (Deleted)
    SUBJECTIVE:   CHIEF COMPLAINT / HPI:   Feet swelling x 1wk ***  PERTINENT  PMH / PSH: ***HTN, schizophrenia  OBJECTIVE:   There were no vitals taken for this visit.  ***  ASSESSMENT/PLAN:   Assessment & Plan    Payton Coward, MD [facility]

## 2024-09-03 ENCOUNTER — Ambulatory Visit: Payer: MEDICAID | Admitting: Family Medicine

## 2024-09-07 NOTE — Progress Notes (Unsigned)
 SUBJECTIVE:   CHIEF COMPLAINT / HPI:  Lisa Crosby is a 43 y.o. 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  STI testing Patient was seen on 8/5 for STI testing and Pap.  Testing positive for trichomonas and BV, prescribed metronidazole  500 mg BID x7 days and recommended partner treatment. Patient called nurse line back to request expedited partner treatment, but was unable to obtain [place].  Patient now returns with concern for bloodborne STI. Partners: Abstinent for over a month.  Last sexually active with a trucker who reported testing positive for hepatitis C. Contraception: Nexplanon , does use barrier method consistently Prior STI: Trichomonas on 8/5, s/p treatment, no further symptoms Prior and possible pregnancy status: On Nexplanon  Pap smear: High-risk HPV ASCUS on 8/5, see below Declines pelvic exam today.  High-risk PAP Pap 8/5 was ASCUS with high-risk HPV, colposcopy indicated. Patient has previously no-showed 3 colposcopy appointments, reportedly due to concerns about how painful they are. Patient was advised on 8/12 by Dr. Janna to pursue colposcopy after high-risk HPV Pap result.\ Today, patient agrees to colposcopy as long as it can be scheduled before August 20.  Hypertension - BP: (!) 144/102 today. - Home medications include: Previously took amlodipine  and wants it again - Does not check blood pressure at home. - Denies hypotension symptoms including dizziness and orthostatic symptoms. - Denies headache, vision changes, LUQ pain, hematuria, chest pain.  Weight loss Has been taking Wegovy  0.25 mg weekly since mid-August. Reports 10-15 lbs of weight loss in the first 2 months of the medicine. Denies nausea, GI side effects.  Feels great on the medicine Has not had Wegovy  in a few weeks. Requests refill at this time. No diabetes  diagnosis.  Schizophrenia Patient states that she has been in a cartoon show and it is the voices in her head and her. Patient declines shot, agrees to pill. Denies SI, HI. States she has been sleeping at least 6 hours nightly. Is not hypervigilant, not constantly anxious.   PERTINENT PMH / PSH: ASCUS w/ high-risk HPV October 2023 MDD GAD Schizophrenia with paranoid features Cocaine use, amphetamine use disorders, quit over 2 months ago with substance-induced psychosis prior Patient endorses being 90% sober for a few months (referring to alcohol)   OBJECTIVE:  BP (!) 144/102   Pulse 81   Wt 233 lb 3.2 oz (105.8 kg)   SpO2 98%   BMI 40.03 kg/m   General: NAD, pleasant, able to participate in exam. Respiratory: Normal effort, no obvious respiratory distress. Abdominal: No suprapubic tenderness, no abdominal tenderness with deep or light palpation.  No hepatosplenomegaly. CV: RRR.  Normal S1/S2.  No m/r/g. Psych: Full range affect, is unremarkable, no flight of ideas.  Appropriate judgement and insight.  Denies SI/HI.   ASSESSMENT/PLAN:   Assessment & Plan Primary hypertension Chronic issue, BP elevated today, poorly controlled.  Not on any medicines currently.  Patient prefers to start amlodipine  first.  Suspect patient will need additional agent at return visit. - Start amlodipine  10 mg - Follow-up in 1 month Screening examination for STI Recently s/p treatment of trichomonas and BV.  Patient declines pelvic exam, solely seeking blood tests. 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  Denies SI/HI.  No evidence of acute mania or psychosis.  Currently abstaining from illicit substances  for over 2 months. - Patient agrees to restart Abilify  30 mg daily by mouth, continues to refuse injectable - Recommended follow-up with psychiatry, patient declines ASCUS with positive high risk HPV cervical Positive on Pap 07/13/2024.  Colposcopy indicated.  Patient states she will get the colpo if it can be done before October 20th - Scheduled for colpo clinic tomorrow at 11:30 am with Dr. Gerrit clearance Class 3 severe obesity without serious comorbidity with body mass index (BMI) of 40.0 to 44.9 in adult, unspecified obesity type [facility]) Patient requests refill of Wegovy  for weight loss. - Refill Wegovy  0.25 mg weekly, suspect this will not be covered given recent changes in Medicaid coverage  Follow-up in 1 month, discuss mammogram at that visit.  Chapel Silverthorn Toma, MD [facility]

## 2024-09-08 ENCOUNTER — Ambulatory Visit (INDEPENDENT_AMBULATORY_CARE_PROVIDER_SITE_OTHER): Payer: MEDICAID | Admitting: Family Medicine

## 2024-09-08 VITALS — BP 144/102 | HR 81 | Wt 233.2 lb

## 2024-09-08 DIAGNOSIS — R87610 Atypical squamous cells of undetermined significance on cytologic smear of cervix (ASC-US): Secondary | ICD-10-CM | POA: Diagnosis not present

## 2024-09-08 DIAGNOSIS — Z6841 Body Mass Index (BMI) 40.0 and over, adult: Secondary | ICD-10-CM

## 2024-09-08 DIAGNOSIS — Z113 Encounter for screening for infections with a predominantly sexual mode of transmission: Secondary | ICD-10-CM | POA: Diagnosis not present

## 2024-09-08 DIAGNOSIS — I10 Essential (primary) hypertension: Secondary | ICD-10-CM | POA: Diagnosis not present

## 2024-09-08 DIAGNOSIS — F200 Paranoid schizophrenia: Secondary | ICD-10-CM | POA: Diagnosis not present

## 2024-09-08 DIAGNOSIS — F333 Major depressive disorder, recurrent, severe with psychotic symptoms: Secondary | ICD-10-CM

## 2024-09-08 DIAGNOSIS — E66813 Obesity, class 3: Secondary | ICD-10-CM

## 2024-09-08 DIAGNOSIS — R87810 Cervical high risk human papillomavirus (HPV) DNA test positive: Secondary | ICD-10-CM

## 2024-09-08 MED ORDER — WEGOVY 0.25 MG/0.5ML ~~LOC~~ SOAJ: 0.2500 mg | SUBCUTANEOUS | 0 refills | Status: AC

## 2024-09-08 MED ORDER — AMLODIPINE BESYLATE 10 MG PO TABS: 10.0000 mg | ORAL_TABLET | Freq: Every day | ORAL | 3 refills | Status: AC

## 2024-09-08 MED ORDER — ARIPIPRAZOLE 30 MG PO TABS: 30.0000 mg | ORAL_TABLET | Freq: Every day | ORAL | 3 refills | Status: AC

## 2024-09-08 MED ORDER — ARIPIPRAZOLE 30 MG PO TABS: 30.0000 mg | ORAL_TABLET | Freq: Every day | ORAL | 0 refills | Status: DC

## 2024-09-08 NOTE — Assessment & Plan Note (Signed)
 Patient requests refill of Wegovy  for weight loss. - Refill Wegovy  0.25 mg weekly, suspect this will not be covered given recent changes in Medicaid coverage

## 2024-09-08 NOTE — Assessment & Plan Note (Signed)
 Patient not currently on any medications.  Denies SI/HI.  No evidence of acute mania or psychosis.  Currently abstaining from illicit substances for over 2 months. - Patient agrees to restart Abilify  30 mg daily by mouth, continues to refuse injectable - Recommended follow-up with psychiatry, patient declines

## 2024-09-08 NOTE — Assessment & Plan Note (Signed)
 Positive on Pap 07/13/2024.  Colposcopy indicated.  Patient states she will get the colpo if it can be done before October 20th - Scheduled for colpo clinic tomorrow at 11:30 am with Dr. Gerrit clearance

## 2024-09-08 NOTE — Patient Instructions (Addendum)
 It was great to see you today! Thank you for choosing [facility] for your primary care.  Today we addressed: Please start taking your Abilify  (this is very important to keep your mood controlled) and I am prescribing amlodipine  for your high blood pressure.  I would like you to return in 1 month to follow-up on your blood pressure.  I am refilling your Wegovy , we will see if it is covered by insurance but is unclear to me if it will be.  Please look out for a call from our clinic about scheduling your colposcopy.  We are checking some labs today, including hepatitis, HIV, and syphilis.  You will get a MyChart message or a letter if results are normal. Otherwise, you will get a call from us .  You should return to our clinic in 1 month or sooner for colposcopy.  Thank you for coming to see us  at [facility] and for the opportunity to care for you! Lisa Mane, MD 09/08/2024, 9:19 AM

## 2024-09-08 NOTE — Assessment & Plan Note (Signed)
 Chronic issue, BP elevated today, poorly controlled.  Not on any medicines currently.  Patient prefers to start amlodipine  first.  Suspect patient will need additional agent at return visit. - Start amlodipine  10 mg - Follow-up in 1 month

## 2024-09-09 ENCOUNTER — Ambulatory Visit (HOSPITAL_COMMUNITY): Payer: Self-pay | Admitting: Family Medicine

## 2024-09-09 ENCOUNTER — Ambulatory Visit: Payer: MEDICAID

## 2024-09-09 ENCOUNTER — Telehealth: Payer: Self-pay

## 2024-09-09 ENCOUNTER — Other Ambulatory Visit (HOSPITAL_COMMUNITY): Payer: Self-pay

## 2024-09-09 LAB — RPR: RPR Ser Ql: NONREACTIVE

## 2024-09-09 LAB — HEPATITIS C ANTIBODY: Hep C Virus Ab: NONREACTIVE

## 2024-09-09 LAB — HEPATITIS B SURFACE ANTIGEN: Hepatitis B Surface Ag: NEGATIVE

## 2024-09-09 LAB — HEPATITIS B SURFACE ANTIBODY,QUALITATIVE: Hep B Surface Ab, Qual: NONREACTIVE

## 2024-09-09 LAB — HIV ANTIBODY (ROUTINE TESTING W REFLEX): HIV Screen 4th Generation wRfx: NONREACTIVE

## 2024-09-09 NOTE — Telephone Encounter (Signed)
 Pharmacy Patient Advocate Encounter  Effective October 1st, [place] will discontinue coverage of GLP1 medications for weight loss (such as Wegovy  and Zepbound), unless the patient has a documented history of a heart attack or stroke. Zepbound will continue to be covered only for patients with moderate to severe sleep apnea (AHI 15-30) and a BMI greater than 40. Because of this change, the prior authorization team will not be submitting new PA requests for GLP1 medications prescribed for weight loss starting October 1st, as patients will be unable to continue therapy under Medicaid coverage.

## 2024-09-29 ENCOUNTER — Encounter: Payer: Self-pay | Admitting: Family Medicine

## 2024-09-29 ENCOUNTER — Ambulatory Visit: Payer: MEDICAID | Admitting: Family Medicine

## 2024-10-29 ENCOUNTER — Ambulatory Visit: Payer: MEDICAID | Admitting: Family Medicine

## 2024-10-29 NOTE — Assessment & Plan Note (Deleted)
 Lipid panel today

## 2024-10-29 NOTE — Progress Notes (Deleted)
   SUBJECTIVE:   CHIEF COMPLAINT / HPI: Lisa Crosby is a 43y.o. 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   STI testing Patient was seen on 8/5 for STI testing and Pap, tested positive for trichomonas and BV, treated with metronidazole , partner [place] of [place] and could not get expedited treatment. HIV, Hep B, Hep C, RPR negative on 10/1.  Partners: *** Practices: *** Contraception: Nexplanon , does not use barrier method consistently Prior STI: Trich as above, other prior STIs as well Prior and possible pregnancy status: Nexplanon  in place since December 2024 Pap smear: High-risk ASCUS on 07/13/2024 as below Is not *** interested in screening for sexually transmitted infections today   High-risk PAP Pap 8/5 was ASCUS with high-risk HPV, colposcopy indicated. Patient has previously no-showed 3 colposcopy appointments, reportedly due to concerns about how painful they are. Patient was advised on 8/12 by Dr. Janna to pursue colposcopy after high-risk HPV Pap result. Numerous issues with scheduling colpo have come up in the past. Today, ***  Hypertension -   today. - Home medications include: ***. - She {Blank single:19197::endorses,does not endorse} taking these medications as prescribed. - {Blank single:19197::Does,Does not} check blood pressure at home, these have ranged ***. - Denies hypotension symptoms including dizziness and orthostatic symptoms. - Denies headache, vision changes, LUQ pain, hematuria, chest pain. - Diet: ***. - Exercise: ***.  Paranoid schizophrenia, GAD, MDD Patient declines aripiprazole  shot, states she has been taking 30 mg tablet***. Is not taking mirtazapine  15 mg. Denies SI, HI today. Endorses ***. States she has been sleeping at least 6 hours nightly. Is not hypervigilant, not constantly anxious.   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  General: NAD, pleasant, able to participate in exam. Respiratory: Normal effort, no obvious respiratory distress. Abdominal: No suprapubic tenderness, no abdominal tenderness with deep or light palpation.  No hepatosplenomegaly. GU:*** Prostate non-tender and not boggy to palpation.  No penile lesions or erythema.  No scrotal edema or tenderness bilaterally. Pelvic:***  Vulva: Normal appearing vulva with no masses, tenderness or lesions, ***. Vagina: Normal appearing vagina with normal color, no lesions, with {GYN VAGINAL DISCHARGE:21986} discharge present, ***. Cervix: No lesions, {GYN VAGINAL DISCHARGE:21986} discharge present. *Chaperone ***, CMA present for pelvic exam.   ASSESSMENT/PLAN:   Assessment & Plan ASCUS with positive high risk HPV cervical  Paranoid schizophrenia (HCC)  - Lipid panel today Hypersexuality Nexplanon  in place  Primary hypertension *** today.  Suspect inconsistent adherence to medication regimen. - Continue amlodipine  10 mg Encounter for screening examination for sexually transmitted disease Treated 8/5 for trichomonas. - Trich test of cure today, wet prep *** - Discussed protection during intercourse and contraceptive methods*** - STI screening for ***GC/chlamydia, HIV, RPR ordered   No follow-ups on file.  Lisa Finck Toma, MD [facility]

## 2024-10-29 NOTE — Assessment & Plan Note (Deleted)
***   today.  Suspect inconsistent adherence to medication regimen. - Continue amlodipine  10 mg

## 2024-11-18 ENCOUNTER — Ambulatory Visit: Payer: MEDICAID

## 2025-01-06 ENCOUNTER — Ambulatory Visit: Payer: Self-pay | Admitting: Family Medicine

## 2025-01-06 NOTE — Progress Notes (Unsigned)
" ° °  SUBJECTIVE:   CHIEF COMPLAINT / HPI:  Lisa Crosby is a 44 y.o. female presenting with ***vaginal discharge for *** days.   STI screening Partners: *** Practices: *** Contraception: {Contraceptives:21111124}, {DOES NOT does:27190::does not} use barrier method consistently Prior STI: *** Prior and possible pregnancy status: *** Pap smear: As below, needs colpo Is interested in screening for sexually transmitted infections today  High-risk PAP Pap 8/5 was ASCUS with high-risk HPV, colposcopy indicated. Patient has previously no-showed 3 colposcopy appointments, reportedly due to concerns about how painful they are. Patient was advised on 07/20/24 by Dr. Janna to pursue colposcopy after high-risk HPV Pap result. Discussed again with patient on 09/08/24 and she agreed to colpo. Patient no showed colpo appointment on 09/09/24. Today, patient ***.  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  General: NAD, pleasant, able to participate in exam. Respiratory: Normal effort, no obvious respiratory distress. Abdominal: No suprapubic tenderness, no abdominal tenderness with deep or light palpation.  No hepatosplenomegaly. GU:*** Prostate non-tender and not boggy to palpation.  No penile lesions or erythema.  No scrotal edema or tenderness bilaterally. Pelvic:***  Vulva: Normal appearing vulva with no masses, tenderness or lesions, ***. Vagina: Normal appearing vagina with normal color, no lesions, with {GYN VAGINAL DISCHARGE:21986} discharge present, ***. Bimanual:*** Cervix without tenderness, no adnexal fullness or tenderness. Cervix: No lesions, {GYN VAGINAL DISCHARGE:21986} discharge  present. *Chaperone ***, CMA present for pelvic exam.   ASSESSMENT/PLAN:   Assessment & Plan   44 y.o. with *** for *** days, as well as ***.  Physical exam significant for *** discharge.  Wet prep performed today shows ***, consistent with ***. - Wet prep as above, treat with *** - STI screening for ***GC/chlamydia, HIV, RPR ordered - Discussed protection during intercourse and contraceptive methods***  No follow-ups on file.  Loi Rennaker Toma, MD [facility]  "
